# Patient Record
Sex: Female | Born: 1965
Health system: Southern US, Community
[De-identification: ages and names within clinical notes are randomized; demographics above are authoritative.]

## PROBLEM LIST (undated history)

## (undated) DIAGNOSIS — Z9889 Other specified postprocedural states: Secondary | ICD-10-CM

## (undated) DIAGNOSIS — T8859XA Other complications of anesthesia, initial encounter: Secondary | ICD-10-CM

## (undated) DIAGNOSIS — M1990 Unspecified osteoarthritis, unspecified site: Secondary | ICD-10-CM

## (undated) DIAGNOSIS — D649 Anemia, unspecified: Secondary | ICD-10-CM

## (undated) DIAGNOSIS — Z6841 Body Mass Index (BMI) 40.0 and over, adult: Secondary | ICD-10-CM

## (undated) DIAGNOSIS — B019 Varicella without complication: Secondary | ICD-10-CM

## (undated) DIAGNOSIS — K219 Gastro-esophageal reflux disease without esophagitis: Secondary | ICD-10-CM

## (undated) HISTORY — DX: Body Mass Index (BMI) 40.0 and over, adult: Z684

## (undated) HISTORY — DX: Gastro-esophageal reflux disease without esophagitis: K21.9

## (undated) HISTORY — PX: SALPINGECTOMY: SHX328

## (undated) HISTORY — DX: Unspecified osteoarthritis, unspecified site: M19.90

## (undated) HISTORY — DX: Varicella without complication: B01.9

---

## 2004-01-23 ENCOUNTER — Other Ambulatory Visit: Admission: RE | Admit: 2004-01-23 | Discharge: 2004-01-23 | Payer: Self-pay | Admitting: *Deleted

## 2004-09-12 ENCOUNTER — Emergency Department: Payer: Self-pay | Admitting: Unknown Physician Specialty

## 2005-07-23 ENCOUNTER — Emergency Department: Payer: Self-pay | Admitting: Unknown Physician Specialty

## 2006-02-17 ENCOUNTER — Ambulatory Visit: Payer: Self-pay | Admitting: Unknown Physician Specialty

## 2006-05-05 ENCOUNTER — Emergency Department: Payer: Self-pay | Admitting: Emergency Medicine

## 2006-11-24 ENCOUNTER — Emergency Department: Payer: Self-pay | Admitting: Emergency Medicine

## 2007-04-11 ENCOUNTER — Emergency Department: Payer: Self-pay | Admitting: Unknown Physician Specialty

## 2009-01-25 ENCOUNTER — Emergency Department: Payer: Self-pay | Admitting: Emergency Medicine

## 2009-03-14 ENCOUNTER — Emergency Department: Payer: Self-pay | Admitting: Unknown Physician Specialty

## 2009-03-27 ENCOUNTER — Ambulatory Visit: Payer: Self-pay | Admitting: Obstetrics and Gynecology

## 2010-02-11 ENCOUNTER — Emergency Department: Payer: Self-pay | Admitting: Emergency Medicine

## 2010-05-29 ENCOUNTER — Emergency Department: Payer: Self-pay | Admitting: Emergency Medicine

## 2013-08-10 ENCOUNTER — Emergency Department: Payer: Self-pay | Admitting: Emergency Medicine

## 2013-12-01 ENCOUNTER — Ambulatory Visit: Payer: Self-pay | Admitting: Adult Health

## 2014-06-01 ENCOUNTER — Ambulatory Visit: Payer: Self-pay | Admitting: Physician Assistant

## 2014-06-01 LAB — GC/CHLAMYDIA PROBE AMP

## 2014-06-01 LAB — WET PREP, GENITAL

## 2015-04-17 ENCOUNTER — Encounter (HOSPITAL_COMMUNITY): Payer: Self-pay | Admitting: *Deleted

## 2015-04-17 ENCOUNTER — Emergency Department (HOSPITAL_COMMUNITY): Payer: 59

## 2015-04-17 ENCOUNTER — Emergency Department (HOSPITAL_COMMUNITY)
Admission: EM | Admit: 2015-04-17 | Discharge: 2015-04-17 | Disposition: A | Discharge status: Home / Self Care | Payer: 59 | Attending: Emergency Medicine | Admitting: Emergency Medicine

## 2015-04-17 DIAGNOSIS — M545 Low back pain: Secondary | ICD-10-CM | POA: Diagnosis present

## 2015-04-17 DIAGNOSIS — Z3202 Encounter for pregnancy test, result negative: Secondary | ICD-10-CM | POA: Diagnosis not present

## 2015-04-17 DIAGNOSIS — A5903 Trichomonal cystitis and urethritis: Secondary | ICD-10-CM | POA: Diagnosis not present

## 2015-04-17 DIAGNOSIS — Z791 Long term (current) use of non-steroidal anti-inflammatories (NSAID): Secondary | ICD-10-CM | POA: Diagnosis not present

## 2015-04-17 DIAGNOSIS — R6883 Chills (without fever): Secondary | ICD-10-CM | POA: Insufficient documentation

## 2015-04-17 DIAGNOSIS — R102 Pelvic and perineal pain: Secondary | ICD-10-CM

## 2015-04-17 LAB — CBC WITH DIFFERENTIAL/PLATELET
Basophils Absolute: 0 10*3/uL (ref 0.0–0.1)
Basophils Relative: 0 % (ref 0–1)
Eosinophils Absolute: 0.1 10*3/uL (ref 0.0–0.7)
Eosinophils Relative: 3 % (ref 0–5)
HEMATOCRIT: 41 % (ref 36.0–46.0)
Hemoglobin: 13.5 g/dL (ref 12.0–15.0)
Lymphocytes Relative: 48 % — ABNORMAL HIGH (ref 12–46)
Lymphs Abs: 2.2 10*3/uL (ref 0.7–4.0)
MCH: 23 pg — ABNORMAL LOW (ref 26.0–34.0)
MCHC: 32.9 g/dL (ref 30.0–36.0)
MCV: 70 fL — AB (ref 78.0–100.0)
MONOS PCT: 7 % (ref 3–12)
Monocytes Absolute: 0.3 10*3/uL (ref 0.1–1.0)
Neutro Abs: 1.8 10*3/uL (ref 1.7–7.7)
Neutrophils Relative %: 42 % — ABNORMAL LOW (ref 43–77)
Platelets: 144 10*3/uL — ABNORMAL LOW (ref 150–400)
RBC: 5.86 MIL/uL — AB (ref 3.87–5.11)
RDW: 14.2 % (ref 11.5–15.5)
WBC: 4.4 10*3/uL (ref 4.0–10.5)

## 2015-04-17 LAB — URINALYSIS, ROUTINE W REFLEX MICROSCOPIC
BILIRUBIN URINE: NEGATIVE
GLUCOSE, UA: NEGATIVE mg/dL
Ketones, ur: NEGATIVE mg/dL
Nitrite: NEGATIVE
PH: 6.5 (ref 5.0–8.0)
Protein, ur: NEGATIVE mg/dL
Specific Gravity, Urine: 1.019 (ref 1.005–1.030)
Urobilinogen, UA: 0.2 mg/dL (ref 0.0–1.0)

## 2015-04-17 LAB — COMPREHENSIVE METABOLIC PANEL
ALBUMIN: 3.1 g/dL — AB (ref 3.5–5.0)
ALT: 16 U/L (ref 14–54)
AST: 18 U/L (ref 15–41)
Alkaline Phosphatase: 68 U/L (ref 38–126)
Anion gap: 6 (ref 5–15)
BILIRUBIN TOTAL: 0.8 mg/dL (ref 0.3–1.2)
BUN: 14 mg/dL (ref 6–20)
CO2: 29 mmol/L (ref 22–32)
Calcium: 8.7 mg/dL — ABNORMAL LOW (ref 8.9–10.3)
Chloride: 101 mmol/L (ref 101–111)
Creatinine, Ser: 0.96 mg/dL (ref 0.44–1.00)
GLUCOSE: 91 mg/dL (ref 65–99)
Potassium: 4.1 mmol/L (ref 3.5–5.1)
SODIUM: 136 mmol/L (ref 135–145)
Total Protein: 5.9 g/dL — ABNORMAL LOW (ref 6.5–8.1)

## 2015-04-17 LAB — WET PREP, GENITAL
CLUE CELLS WET PREP: NONE SEEN
YEAST WET PREP: NONE SEEN

## 2015-04-17 LAB — URINE MICROSCOPIC-ADD ON

## 2015-04-17 LAB — LIPASE, BLOOD: LIPASE: 18 U/L — AB (ref 22–51)

## 2015-04-17 LAB — POC URINE PREG, ED: PREG TEST UR: NEGATIVE

## 2015-04-17 MED ORDER — METRONIDAZOLE 500 MG PO TABS: 500.0000 mg | ORAL_TABLET | Freq: Two times a day (BID) | ORAL | Status: DC

## 2015-04-17 MED ORDER — LIDOCAINE HCL (PF) 1 % IJ SOLN
0.9000 mL | Freq: Once | INTRAMUSCULAR | Status: AC
  Administered 2015-04-17: 0.9 mL
  Filled 2015-04-17: qty 5

## 2015-04-17 MED ORDER — CEFTRIAXONE SODIUM 250 MG IJ SOLR
250.0000 mg | Freq: Once | INTRAMUSCULAR | Status: AC
  Administered 2015-04-17: 250 mg via INTRAMUSCULAR
  Filled 2015-04-17: qty 250

## 2015-04-17 MED ORDER — AZITHROMYCIN 250 MG PO TABS
1000.0000 mg | ORAL_TABLET | Freq: Once | ORAL | Status: AC
  Administered 2015-04-17: 1000 mg via ORAL
  Filled 2015-04-17: qty 4

## 2015-04-17 NOTE — ED Notes (Signed)
Pt reports right side flank pain x 2-3 days with urinary frequency and vaginal discharge.

## 2015-04-17 NOTE — ED Provider Notes (Signed)
CSN: [id number]     Arrival date & time 04/17/15  0907 History   First MD Initiated Contact with Patient 04/17/15 0913     Chief Complaint  Patient presents with  . Back Pain  . Urinary Frequency     (Consider location/radiation/quality/duration/timing/severity/associated sxs/prior Treatment) The history is provided by the patient. No language interpreter was used.  JOLONDA GOMM is a 49 y/o F with no know significant PMHx presenting to the ED with increased urination and lower abdominal pain that has been ongoing for approximately one week - patient reported that frequent urination and urge came about one week ago followed by the abdominal pain approximately 4-5 days ago. Patient reported that she has been going to the bathroom at least 6-7 times per day, but more at nighttime. Stated that she has been having right sided abdominal pain described as a sharp, shooting pain with radiation to her lower back. Reported that she has been experiencing sharp back pain as well. Stated that she has been taking Tylenol with minimal relief and reported that she has been staying hydrated. Patient reported that her LMP was 2 weeks ago, denied being on birth control. Reported that she does not have history of DM, but paternal grandmother has history. Patient reported that she has also been experiencing vaginal discharge for approximately one week described as yellowish in color and with a fishy odor. Patient reported that she is sexually active and does not use protection, reported that her last encounter was one week ago. Denied dysuria, fever, nausea, vomiting, diarrhea, melena, hematochezia, appetite changes, fainting, dizziness, travels, chest pain, shortness of breath, difficulty breathing, vaginal itching, vaginal irritation, rashes, sores, abnormal vaginal bleeding, urinary bowel incontinence. PCP none  History reviewed. No pertinent past medical history. History reviewed. No pertinent past surgical  history. No family history on file. History  Substance Use Topics  . Smoking status: Never Smoker   . Smokeless tobacco: Not on file  . Alcohol Use: No   OB History    No data available     Review of Systems  Constitutional: Positive for chills. Negative for fever.  Respiratory: Negative for chest tightness and shortness of breath.   Cardiovascular: Negative for chest pain.  Gastrointestinal: Positive for abdominal pain. Negative for nausea, vomiting, diarrhea, constipation, blood in stool and anal bleeding.  Endocrine: Positive for polydipsia and polyuria.  Genitourinary: Positive for vaginal discharge. Negative for dysuria, flank pain, decreased urine volume, vaginal bleeding, vaginal pain and pelvic pain.  Musculoskeletal: Positive for back pain.  Neurological: Negative for weakness.      Allergies  Review of patient's allergies indicates no known allergies.  Home Medications   Prior to Admission medications   Medication Sig Start Date End Date Taking? Authorizing Provider  Acetaminophen 500 MG coapsule Take 500 mg by mouth every 4 (four) hours as needed for pain.    Yes Historical Provider, MD  celecoxib (CELEBREX) 200 MG capsule Take 200 mg by mouth 2 (two) times daily.  01/24/15  Yes Historical Provider, MD  meloxicam (MOBIC) 15 MG tablet Take 15 mg by mouth daily.   Yes Historical Provider, MD  metroNIDAZOLE (FLAGYL) 500 MG tablet Take 1 tablet (500 mg total) by mouth 2 (two) times daily. 04/17/15   Majorie Santee, PA-C   BP 126/78 mmHg  Pulse 69  Temp(Src) 97.8 F (36.6 C) (Oral)  Resp 16  SpO2 100% Physical Exam  Constitutional: She is oriented to person, place, and time. She appears well-developed and  well-nourished. No distress.  HENT:  Head: Normocephalic and atraumatic.  Mouth/Throat: Oropharynx is clear and moist. No oropharyngeal exudate.  Eyes: Conjunctivae and EOM are normal. Right eye exhibits no discharge. Left eye exhibits no discharge.  Neck: Normal  range of motion. Neck supple.  Cardiovascular: Normal rate, regular rhythm and normal heart sounds.  Exam reveals no friction rub.   No murmur heard. Pulses:      Radial pulses are 2+ on the right side, and 2+ on the left side.  Pulmonary/Chest: Effort normal and breath sounds normal. No respiratory distress. She has no wheezes. She has no rales.  Abdominal: Soft. Bowel sounds are normal. She exhibits no distension. There is tenderness in the right lower quadrant, epigastric area, suprapubic area and left lower quadrant. There is no rebound, no guarding and no CVA tenderness.  Mild epigastric pain - most discomfort upon palpation to the lower abdomen.   Obese   Genitourinary:  Pelvic Exam: Negative swelling, erythema, inflammation, lesions, sores, deformities, areas of induration or fluctuance noted on the external genitalia. Negative bright red blood in the vaginal vault. Thick yellow discharge identified with foul odor. Negative cervical friability. Negative CMT. Mild bilateral adnexal tenderness. Exam chaperoned with nurse, Hayley.   Musculoskeletal: Normal range of motion. She exhibits tenderness.       Lumbar back: She exhibits tenderness. She exhibits normal range of motion, no bony tenderness, no swelling, no edema and no deformity.       Back:  Negative deformities identified to the spine. Mild discomfort upon palpation to the lumbar aspect midline and paravertebral regions bilaterally-appears to be muscular in nature.  Neurological: She is alert and oriented to person, place, and time. No cranial nerve deficit. She exhibits normal muscle tone. Coordination normal. GCS eye subscore is 4. GCS verbal subscore is 5. GCS motor subscore is 6.  Skin: Skin is warm and dry. No rash noted. She is not diaphoretic. No erythema.  Psychiatric: She has a normal mood and affect. Her behavior is normal. Thought content normal.  Nursing note and vitals reviewed.   ED Course  Procedures (including  critical care time)  Results for orders placed or performed during the hospital encounter of 04/17/15  Wet prep, genital  Result Value Ref Range   Yeast Wet Prep HPF POC NONE SEEN NONE SEEN   Trich, Wet Prep FEW (A) NONE SEEN   Clue Cells Wet Prep HPF POC NONE SEEN NONE SEEN   WBC, Wet Prep HPF POC FEW (A) NONE SEEN  CBC with Differential/Platelet  Result Value Ref Range   WBC 4.4 4.0 - 10.5 K/uL   RBC 5.86 (H) 3.87 - 5.11 MIL/uL   Hemoglobin 13.5 12.0 - 15.0 g/dL   HCT 41.0 36.0 - 46.0 %   MCV 70.0 (L) 78.0 - 100.0 fL   MCH 23.0 (L) 26.0 - 34.0 pg   MCHC 32.9 30.0 - 36.0 g/dL   RDW 14.2 11.5 - 15.5 %   Platelets 144 (L) 150 - 400 K/uL   Neutrophils Relative % 42 (L) 43 - 77 %   Lymphocytes Relative 48 (H) 12 - 46 %   Monocytes Relative 7 3 - 12 %   Eosinophils Relative 3 0 - 5 %   Basophils Relative 0 0 - 1 %   Neutro Abs 1.8 1.7 - 7.7 K/uL   Lymphs Abs 2.2 0.7 - 4.0 K/uL   Monocytes Absolute 0.3 0.1 - 1.0 K/uL   Eosinophils Absolute 0.1 0.0 - 0.7  K/uL   Basophils Absolute 0.0 0.0 - 0.1 K/uL   RBC Morphology POLYCHROMASIA PRESENT   Comprehensive metabolic panel  Result Value Ref Range   Sodium 136 135 - 145 mmol/L   Potassium 4.1 3.5 - 5.1 mmol/L   Chloride 101 101 - 111 mmol/L   CO2 29 22 - 32 mmol/L   Glucose, Bld 91 65 - 99 mg/dL   BUN 14 6 - 20 mg/dL   Creatinine, Ser 0.96 0.44 - 1.00 mg/dL   Calcium 8.7 (L) 8.9 - 10.3 mg/dL   Total Protein 5.9 (L) 6.5 - 8.1 g/dL   Albumin 3.1 (L) 3.5 - 5.0 g/dL   AST 18 15 - 41 U/L   ALT 16 14 - 54 U/L   Alkaline Phosphatase 68 38 - 126 U/L   Total Bilirubin 0.8 0.3 - 1.2 mg/dL   GFR calc non Af Amer >60 >60 mL/min   GFR calc Af Amer >60 >60 mL/min   Anion gap 6 5 - 15  Urinalysis, Routine w reflex microscopic (not at [facility])  Result Value Ref Range   Color, Urine YELLOW YELLOW   APPearance HAZY (A) CLEAR   Specific Gravity, Urine 1.019 1.005 - 1.030   pH 6.5 5.0 - 8.0   Glucose, UA NEGATIVE NEGATIVE mg/dL   Hgb urine  dipstick TRACE (A) NEGATIVE   Bilirubin Urine NEGATIVE NEGATIVE   Ketones, ur NEGATIVE NEGATIVE mg/dL   Protein, ur NEGATIVE NEGATIVE mg/dL   Urobilinogen, UA 0.2 0.0 - 1.0 mg/dL   Nitrite NEGATIVE NEGATIVE   Leukocytes, UA TRACE (A) NEGATIVE  Lipase, blood  Result Value Ref Range   Lipase 18 (L) 22 - 51 U/L  Urine microscopic-add on  Result Value Ref Range   Squamous Epithelial / LPF MANY (A) RARE   WBC, UA 7-10 <3 WBC/hpf   RBC / HPF 3-6 <3 RBC/hpf   Bacteria, UA MANY (A) RARE   Urine-Other TRICHOMONAS PRESENT   POC urine preg, ED (not at [facility])  Result Value Ref Range   Preg Test, Ur NEGATIVE NEGATIVE    Labs Review Labs Reviewed  WET PREP, GENITAL - Abnormal; Notable for the following:    Trich, Wet Prep FEW (*)    WBC, Wet Prep HPF POC FEW (*)    All other components within normal limits  CBC WITH DIFFERENTIAL/PLATELET - Abnormal; Notable for the following:    RBC 5.86 (*)    MCV 70.0 (*)    MCH 23.0 (*)    Platelets 144 (*)    Neutrophils Relative % 42 (*)    Lymphocytes Relative 48 (*)    All other components within normal limits  COMPREHENSIVE METABOLIC PANEL - Abnormal; Notable for the following:    Calcium 8.7 (*)    Total Protein 5.9 (*)    Albumin 3.1 (*)    All other components within normal limits  URINALYSIS, ROUTINE W REFLEX MICROSCOPIC (NOT AT [facility]) - Abnormal; Notable for the following:    APPearance HAZY (*)    Hgb urine dipstick TRACE (*)    Leukocytes, UA TRACE (*)    All other components within normal limits  LIPASE, BLOOD - Abnormal; Notable for the following:    Lipase 18 (*)    All other components within normal limits  URINE MICROSCOPIC-ADD ON - Abnormal; Notable for the following:    Squamous Epithelial / LPF MANY (*)    Bacteria, UA MANY (*)    All other components within normal limits  POC URINE PREG, ED  GC/CHLAMYDIA PROBE AMP () NOT AT [facility]    Imaging Review US Transvaginal Non-ob  04/17/2015   CLINICAL DATA:  Right  flank pain, urinary frequency for 2-3 days.  EXAM: TRANSABDOMINAL AND TRANSVAGINAL ULTRASOUND OF PELVIS  DOPPLER ULTRASOUND OF OVARIES  TECHNIQUE: Both transabdominal and transvaginal ultrasound examinations of the pelvis were performed. Transabdominal technique was performed for global imaging of the pelvis including uterus, ovaries, adnexal regions, and pelvic cul-de-sac.  It was necessary to proceed with endovaginal exam following the transabdominal exam to visualize the endometrium and ovaries. Color and duplex Doppler ultrasound was utilized to evaluate blood flow to the ovaries.  COMPARISON:  None.  FINDINGS: Uterus  Measurements: 8.9 x 4.7 x 5.4 cm. Heterogeneous echotexture without focal abnormality. Fluid noted within the endocervical canal.  Endometrium  Thickness: Difficult to visualize due to the heterogeneous echotexture, 5 mm where visualized. No focal abnormality visualized.  Right ovary  Measurements: 4.0 x 2.6 x 3.2 cm. Normal appearance/no adnexal mass.  Left ovary  Measurements: 2.6 x 2.9 x 2.6 cm. Normal appearance/no adnexal mass.  Pulsed Doppler evaluation of both ovaries demonstrates normal low-resistance arterial and venous waveforms.  Other findings  No free fluid.  IMPRESSION: Heterogeneous echotexture within the uterus without focal abnormality. This can be seen with adenomyosis.  No acute findings.   Electronically Signed   By: Rolm Baptise M.D.   On: 04/17/2015 13:39   US Pelvis Complete  04/17/2015   CLINICAL DATA:  Right flank pain, urinary frequency for 2-3 days.  EXAM: TRANSABDOMINAL AND TRANSVAGINAL ULTRASOUND OF PELVIS  DOPPLER ULTRASOUND OF OVARIES  TECHNIQUE: Both transabdominal and transvaginal ultrasound examinations of the pelvis were performed. Transabdominal technique was performed for global imaging of the pelvis including uterus, ovaries, adnexal regions, and pelvic cul-de-sac.  It was necessary to proceed with endovaginal exam following the transabdominal exam to  visualize the endometrium and ovaries. Color and duplex Doppler ultrasound was utilized to evaluate blood flow to the ovaries.  COMPARISON:  None.  FINDINGS: Uterus  Measurements: 8.9 x 4.7 x 5.4 cm. Heterogeneous echotexture without focal abnormality. Fluid noted within the endocervical canal.  Endometrium  Thickness: Difficult to visualize due to the heterogeneous echotexture, 5 mm where visualized. No focal abnormality visualized.  Right ovary  Measurements: 4.0 x 2.6 x 3.2 cm. Normal appearance/no adnexal mass.  Left ovary  Measurements: 2.6 x 2.9 x 2.6 cm. Normal appearance/no adnexal mass.  Pulsed Doppler evaluation of both ovaries demonstrates normal low-resistance arterial and venous waveforms.  Other findings  No free fluid.  IMPRESSION: Heterogeneous echotexture within the uterus without focal abnormality. This can be seen with adenomyosis.  No acute findings.   Electronically Signed   By: Rolm Baptise M.D.   On: 04/17/2015 13:39   [place] Art/ven Flow Abd Pelv Doppler  04/17/2015   CLINICAL DATA:  Right flank pain, urinary frequency for 2-3 days.  EXAM: TRANSABDOMINAL AND TRANSVAGINAL ULTRASOUND OF PELVIS  DOPPLER ULTRASOUND OF OVARIES  TECHNIQUE: Both transabdominal and transvaginal ultrasound examinations of the pelvis were performed. Transabdominal technique was performed for global imaging of the pelvis including uterus, ovaries, adnexal regions, and pelvic cul-de-sac.  It was necessary to proceed with endovaginal exam following the transabdominal exam to visualize the endometrium and ovaries. Color and duplex Doppler ultrasound was utilized to evaluate blood flow to the ovaries.  COMPARISON:  None.  FINDINGS: Uterus  Measurements: 8.9 x 4.7 x 5.4 cm. Heterogeneous echotexture without focal abnormality. Fluid noted  within the endocervical canal.  Endometrium  Thickness: Difficult to visualize due to the heterogeneous echotexture, 5 mm where visualized. No focal abnormality visualized.  Right ovary   Measurements: 4.0 x 2.6 x 3.2 cm. Normal appearance/no adnexal mass.  Left ovary  Measurements: 2.6 x 2.9 x 2.6 cm. Normal appearance/no adnexal mass.  Pulsed Doppler evaluation of both ovaries demonstrates normal low-resistance arterial and venous waveforms.  Other findings  No free fluid.  IMPRESSION: Heterogeneous echotexture within the uterus without focal abnormality. This can be seen with adenomyosis.  No acute findings.   Electronically Signed   By: Rolm Baptise M.D.   On: 04/17/2015 13:39     EKG Interpretation None      MDM   Final diagnoses:  Pelvic pain in female  Trichomonal cystitis    Medications  cefTRIAXone (ROCEPHIN) injection 250 mg (250 mg Intramuscular Given 04/17/15 1219)  azithromycin (ZITHROMAX) tablet 1,000 mg (1,000 mg Oral Given 04/17/15 1218)  lidocaine (PF) (XYLOCAINE) 1 % injection 0.9 mL (0.9 mLs Other Given 04/17/15 1219)    Filed Vitals:   04/17/15 1245 04/17/15 1337 04/17/15 1352 04/17/15 1400  BP:  129/77  126/78  Pulse: 64  74 69  Temp:      TempSrc:      Resp:      SpO2: 100%  100% 100%   CBC negative elevated leukocytosis. Hemoglobin 13.5, hematocrit 41.0. CMP noted glucose of 91 with negative elevated anion gap. Lipase negative elevation. Urine pregnancy negative. UA noted trace of hemoglobin with a red blood cell count of 3-6, with negative nitrites, trace leukocytes with many squamous cells and white blood cell count 7-10, trichomonas noted in urine. Wet prep notedEast, few Trichomonas, no clue cells and few white blood cells. GC Chlamydia probe pending. Ultrasound pelvis noted heterogeneous echotexture within the uterus without focal abnormality. No acute abnormalities noted. Abdominal examination benign-doubt acute abdomen. Negative CVA tenderness, trace hemoglobin noted in urine with a red blood cell count of 3-6, not many - doubt pyelonephritis or kidney stone. Negative findings of tubo-ovarian abscess. Negative findings of ovarian torsion. Upon  pelvic exam patient did have mild discomfort-ultrasound is ordered and pending. Trichomonas noted in urine, will treat patient-reported that she is sexually active and has not use protection - reported last encounter was one week ago. Glucose is 91 with negative elevated anion gap-no findings of ketones or glucose in urine-doubt new onset diabetes. Patient treated with by mouth medications in ED setting. Negative episodes of emesis while in ED setting. Patient stable, afebrile. Patient not septic appearing. Negative signs of respiratory distress. Discharged patient. Discharge patient with Flagyl. Discussed with patient to follow-up with health department and for previous partner/partners within the past 3-6 months to be tested and treated as well. Discussed with patient to avoid any sexual activity, educated patient on safe sex habits. Discussed with patient to rest and stay hydrated. Discussed with patient to avoid any physical or strenuous activity. Discussed with patient to closely monitor symptoms and if symptoms are to worsen or change to report back to the ED - strict return instructions given.  Patient agreed to plan of care, understood, all questions answered.   Jamse Mead, PA-C 04/17/15 1443  Daleen Bo, MD 04/22/15 1950

## 2015-04-17 NOTE — Discharge Instructions (Signed)
Please call your doctor for a followup appointment within 24-48 hours. When you talk to your doctor please let them know that you were seen in the emergency department and have them acquire all of your records so that they can discuss the findings with you and formulate a treatment plan to fully care for your new and ongoing problems. Please take medications as prescribed Please follow-up with her primary care provider Please follow-up with OB/GYN and health and wellness Center Please have all partner/partners within the past 3 months be tested and treated, this can be performed at health Department Please avoid any sexual activity until fully treated and rechecked Safe sex is always important-please always use a condom While taking Flagyl there is to be no drinking alcohol for this can result in worsening symptoms Please continue to monitor symptoms closely and if symptoms are to worsen or change (fever greater than 101, chills, sweating, nausea, vomiting, chest pain, shortness of breathe, difficulty breathing, weakness, numbness, tingling, worsening or changes to pain pattern, blood in the stool, black tarry stools, change to vaginal discharge, inability keep food fluids down) please report back to the Emergency Department immediately.   Trichomoniasis Trichomoniasis is an infection caused by an organism called Trichomonas. The infection can affect both women and men. In women, the outer female genitalia and the vagina are affected. In men, the penis is mainly affected, but the prostate and other reproductive organs can also be involved. Trichomoniasis is a sexually transmitted infection (STI) and is most often passed to another person through sexual contact.  RISK FACTORS  Having unprotected sexual intercourse.  Having sexual intercourse with an infected partner. SIGNS AND SYMPTOMS  Symptoms of trichomoniasis in women include:  Abnormal gray-green frothy vaginal discharge.  Itching and  irritation of the vagina.  Itching and irritation of the area outside the vagina. Symptoms of trichomoniasis in men include:   Penile discharge with or without pain.  Pain during urination. This results from inflammation of the urethra. DIAGNOSIS  Trichomoniasis may be found during a Pap test or physical exam. Your health care provider may use one of the following methods to help diagnose this infection:  Examining vaginal discharge under a microscope. For men, urethral discharge would be examined.  Testing the pH of the vagina with a test tape.  Using a vaginal swab test that checks for the Trichomonas organism. A test is available that provides results within a few minutes.  Doing a culture test for the organism. This is not usually needed. TREATMENT   You may be given medicine to fight the infection. Women should inform their health care provider if they could be or are pregnant. Some medicines used to treat the infection should not be taken during pregnancy.  Your health care provider may recommend over-the-counter medicines or creams to decrease itching or irritation.  Your sexual partner will need to be treated if infected. HOME CARE INSTRUCTIONS   Take medicines only as directed by your health care provider.  Take over-the-counter medicine for itching or irritation as directed by your health care provider.  Do not have sexual intercourse while you have the infection.  Women should not douche or wear tampons while they have the infection.  Discuss your infection with your partner. Your partner may have gotten the infection from you, or you may have gotten it from your partner.  Have your sex partner get examined and treated if necessary.  Practice safe, informed, and protected sex.  See your health care  provider for other STI testing. SEEK MEDICAL CARE IF:   You still have symptoms after you finish your medicine.  You develop abdominal pain.  You have pain when  you urinate.  You have bleeding after sexual intercourse.  You develop a rash.  Your medicine makes you sick or makes you throw up (vomit). MAKE SURE YOU:  Understand these instructions.  Will watch your condition.  Will get help right away if you are not doing well or get worse. Document Released: 04/21/2001 Document Revised: 03/12/2014 Document Reviewed: 08/07/2013 [facility] Patient Information 2015 [place], [place]. This information is not intended to replace advice given to you by your health care provider. Make sure you discuss any questions you have with your health care provider.   Emergency Department Resource Guide 1) Find a Doctor and Pay Out of Pocket Although you won't have to find out who is covered by your insurance plan, it is a good idea to ask around and get recommendations. You will then need to call the office and see if the doctor you have chosen will accept you as a new patient and what types of options they offer for patients who are self-pay. Some doctors offer discounts or will set up payment plans for their patients who do not have insurance, but you will need to ask so you aren't surprised when you get to your appointment.  2) Contact Your Local Health Department Not all health departments have doctors that can see patients for sick visits, but many do, so it is worth a call to see if yours does. If you don't know where your local health department is, you can check in your phone book. The CDC also has a tool to help you locate your state's health department, and many state websites also have listings of all of their local health departments.  3) Find a [facility] If your illness is not likely to be very severe or complicated, you may want to try a walk in clinic. These are popping up all over the country in pharmacies, drugstores, and shopping centers. They're usually staffed by nurse practitioners or physician assistants that have been trained to treat common  illnesses and complaints. They're usually fairly quick and inexpensive. However, if you have serious medical issues or chronic medical problems, these are probably not your best option.  No Primary Care Doctor: - Call Health Connect at  [phone] - they can help you locate a primary care doctor that  accepts your insurance, provides certain services, etc. - Physician Referral Service- [phone]  Chronic Pain Problems: Organization         Address  Phone   Notes  [facility]  [phone] Patients need to be referred by their primary care doctor.   Medication Assistance: Organization         Address  Phone   Notes  [facility] Medication [facility] [address] [place], [place] [address] [phone] --Must be a resident of [facility] -- Must have NO insurance coverage whatsoever (no Medicaid/ Medicare, etc.) -- The pt. MUST have a primary care doctor that directs their care regularly and follows them in the community   Med[facility]ist  [phone]   [employer]  [phone]    Agencies that provide inexpensive medical care: Organization         Address  Phone   Notes  [place]  [phone]   [facility]    (  [phone]   [facility] [place], [place] [address] [phone]   [place] [address], [place] [phone]   Planned Parenthood    [phone]   [facility]    [phone]   [address], [place] Phone:  [phone], Fax:  [phone] Hours of Operation:  9 am - 6 pm, M-F.  Also accepts Medicaid/Medicare and self-pay.  [facility] for [address], [address], [place] Phone: [phone], Fax: [phone]. Hours of Operation:  8:30 am - 5:30 pm, M-F.  Also accepts Medicaid and self-pay.  [facility]  [address] [place] Phone: [phone]   [place], [place], [place] [phone], Ext. [phone] Mondays & Thursdays: 7-9 AM.  First 15 patients are seen on a first come, first serve basis.    [place] Providers:  Organization         Address  Phone   Notes  [facility] [address], [place] [phone] Also accepts self-pay patients.  [facility] [address], [place]  [phone]   [place], [address], [place] [phone]   [facility] [address], [place] [phone]   Lucianne Lei [address], [place]   [phone] Only accepts [place] Access [place] patients after they have their name applied to their card.   Self-Pay (no insurance) in [facility]:  Organization         Address  Phone   Notes  Sickle Cell Patients, [facility] [place] [phone]   [facility]gent Care [place] [phone]   [facility] [place]  [place], [place], [place] [phone]   [facility]/Dr. Osei-Bonsu  [address] [place] or [address], [place] [phone] Phone number for both [place] and [place] locations is the same.  Urgent Medical and [facility] [address], [place] [phone]   [facility] [address], [place] or [address] [phone]   [facility] [address] [place] [phone], phone; [phone], fax Sees patients 1st and 3rd Saturday of every month.  Must not qualify for public or private insurance (i.e. Medicaid, Medicare, [place] Health Choice, Veterans' Benefits)  Household income should be no more than 200% of the  poverty level The clinic cannot treat you if you are pregnant or think you are pregnant  Sexually transmitted diseases are not treated at the clinic.    Dental Care: Organization         Address  Phone  Notes  [facility] Department of [facility] [place] [phone] Accepts children up to age 70 who are enrolled in [place] or [place]; pregnant women with a Medicaid card; and children who have applied for Medicaid or [place] Health Choice, but were declined, whose parents can pay a reduced fee at time of service.  [facility] Department of [facility]  [address], [place] [phone] Accepts children up  to age 26 who are enrolled in Medicaid or [place] Health Choice; pregnant women with a Medicaid card; and children who have applied for Medicaid or [place] Health Choice, but were declined, whose parents can pay a reduced fee at time of service.  [facility] Access PRO[facility]  [place] [phone] Patients are seen by appointment only. Walk-ins are not accepted. [place] will see patients 27 years of age and older. Monday - Tuesday (8am-5pm) Most Wednesdays (8:30-5pm) $30 per visit, cash only  [facility] Adult Dental Access PROGRAM  [address], [facility] [phone] Patients are seen by appointment only. Walk-ins are not accepted. [place] will see patients 68 years of age and older. One Wednesday Evening (Monthly: Volunteer Based).  $30 per visit, cash only  [place]  [phone] for adults; Children under age 37, call Graduate Pediatric Dentistry at [phone]. Children aged 65-14, please call [phone] to request a pediatric application.  Dental services are provided in all areas of dental care including fillings, crowns and bridges, complete and partial dentures, implants, gum treatment, root canals, and extractions.  Preventive care is also provided. Treatment is provided to both adults and children. Patients are selected via a lottery and there is often a waiting list.   [facility] [address] [place]  [phone] [email or web address]   [facility] [address] [place], [place] [phone], Ext. [phone] Second and Fourth Thursday of each month, opens at 6:30 AM; Clinic ends at 9 AM.  Patients are seen on a first-come first-served basis, and a limited number are seen during each clinic.   [facility]  [address] [place]ard Danker [place], [place] [phone]   Eligibility Requirements You must have lived in [place], [place], or [place] counties for at least the last three months.   You cannot be eligible for state or federal sponsored [employer], including [employer], [place], or [employer].   You generally cannot be eligible for healthcare insurance through your employer.    How to apply: Eligibility screenings are held every Tuesday and Wednesday afternoon from 1:00 pm until 4:00 pm. You do not need an appointment for the interview!  [facility] [address], [place], [place]   [place]  [place] Department  [place]  [phone]    Behavioral Health Resources in the Community: Intensive Outpatient Programs Organization         Address  Phone  Notes  Irena Quail. [address], [place], [place] [phone]   [facility] Outpatient [address] [place], [place]   ADS: Alcohol & Drug Svcs [address] [place], [place]   [place] [address]  [place], [place] or [phone]   Substance Abuse Resources Organization         Address  Phone  Notes  Alcohol and Drug Services  [phone]   [place]  [phone]   [facility] [place]   Chinita Pester  [phone]   Residential & Outpatient Substance Abuse Program  [phone]   Psychological Services Organization         Address  Phone  Notes  [place]  [place]  [place]   [place]  [address] [place] or [phone]    Mobile Crisis Teams Organization         Address  Phone  Notes  [facility], Mobile Crisis Care Unit  [phone]   [facility]  [address] [place], [place]   [place] [address], [place] [phone]    Self-Help/Support Groups Organization         Address  Phone             Notes  [place]. of [place] - variety of support groups  [place] Call for more information  Narcotics Anonymous (NA), [facility]aring [facility] [address], [employer] [place]  2 meetings at this location   Special educational needs teacher         Address  Phone  Notes  [facility] Residential Treatment [place],    [place]  [phone]   [facility]  [address] [place] [address]902, [place], [place]   [place], [place] [phone] Admissions: 8am-3pm M-F  Incentives Substance [place] [address]    [place], [place] [phone]   [facility] [address] [place], [place], [place]   [facility] [address]  [place], [place]   [facility] - Intensive Outpatient [address] Kristeen [address], [place], [place]   [facility] ([place].,  [place], [place] [phone] or [phone]   [facility] Services (RTS) [address], [place], [place] Accepts Medicaid  [facility] [address],  [place]  [phone] Substance Abuse/Addiction Treatment   [facility] Organization         Address  Phone  Notes  [facility]  [phone]   Domenic Schwab, PhD [address] Arlis Porta [place], [place]   [phone] or [phone]   [place], [place] [phone]   [facility] [address] [place], [place] [phone] Insurance/Medicaid/sponsorship through [facility] and Families [address] [place]                                    [place], [place] [phone] [place] [address] [place] [address], [place] [phone]    Dr. Adele Schilder  [phone]   [facility] Dept. 1) [address]  2) [place] 3)  [place] [address], [place] [phone]  [phone]   [place] [phone] or [phone] (After Hours)

## 2015-04-17 NOTE — ED Notes (Signed)
PA @ bedside.

## 2015-04-17 NOTE — ED Provider Notes (Signed)
  Face-to-face evaluation   History: She has dysuria, urinary frequency and low back pain. Symptoms short-term.  Physical exam: Alert, obese female, appears comfortable. No respiratory distress. She is lucid, and cooperative.  Medical screening examination/treatment/procedure(s) were conducted as a shared visit with non-physician practitioner(s) and myself.  I personally evaluated the patient during the encounter  Daleen Bo, MD 04/22/15 1950

## 2015-04-17 NOTE — ED Notes (Signed)
PA at bedside.

## 2015-04-17 NOTE — ED Notes (Signed)
Patient transported to Ultrasound 

## 2015-04-18 LAB — GC/CHLAMYDIA PROBE AMP (~~LOC~~) NOT AT ARMC
CHLAMYDIA, DNA PROBE: NEGATIVE
Neisseria Gonorrhea: NEGATIVE

## 2015-04-25 ENCOUNTER — Encounter: Payer: Self-pay | Admitting: Internal Medicine

## 2015-04-25 ENCOUNTER — Ambulatory Visit (INDEPENDENT_AMBULATORY_CARE_PROVIDER_SITE_OTHER): Payer: 59 | Admitting: Internal Medicine

## 2015-04-25 VITALS — BP 114/62 | HR 96 | Temp 98.1°F | Ht 66.0 in | Wt 288.0 lb

## 2015-04-25 DIAGNOSIS — K219 Gastro-esophageal reflux disease without esophagitis: Secondary | ICD-10-CM

## 2015-04-25 DIAGNOSIS — M129 Arthropathy, unspecified: Secondary | ICD-10-CM | POA: Diagnosis not present

## 2015-04-25 DIAGNOSIS — M170 Bilateral primary osteoarthritis of knee: Secondary | ICD-10-CM | POA: Insufficient documentation

## 2015-04-25 MED ORDER — RANITIDINE HCL 150 MG PO CAPS: 150.0000 mg | ORAL_CAPSULE | Freq: Every evening | ORAL | Status: DC

## 2015-04-25 NOTE — Progress Notes (Signed)
HPI  Pt presents to the clinic today to establish care and for management of the conditions listed below. Valerie Andrews has not had a PCP in many years.  Arthritis: Mainly in her knees. Valerie Andrews alternates Tylenol, Meloxicam, and Celebrex which does seem to help. Valerie Andrews knows that her knee pain is exacerbated by her weight.  GERD: Valerie Andrews reports this occurs about every day. Spicy foods and fried foods make it worse. Valerie Andrews tries to avoid her triggers. Valerie Andrews takes Tums and Rolaids just about on a daily basis.  Obesity: Valerie Andrews does not adhere to any exercise or diet regimen. Valerie Andrews eats whatever Valerie Andrews wants to eat. Her BMI is 48.  Flu: 08/2014 Tetanus: Valerie Andrews thinks it was around 2008-2009 LMP: May 30th, regular Pap Smear: 2014, normal Mammogram: 2013 at [place] Screening: as needed Dentist: as needeed  Past Medical History  Diagnosis Date  . Arthritis   . GERD (gastroesophageal reflux disease)   . Chicken pox     Current Outpatient Prescriptions  Medication Sig Dispense Refill  . Acetaminophen 500 MG coapsule Take 500 mg by mouth every 4 (four) hours as needed for pain.     . celecoxib (CELEBREX) 200 MG capsule Take 200 mg by mouth 2 (two) times daily.     . meloxicam (MOBIC) 15 MG tablet Take 15 mg by mouth daily.    . metroNIDAZOLE (FLAGYL) 500 MG tablet Take 1 tablet (500 mg total) by mouth 2 (two) times daily. 14 tablet 0   No current facility-administered medications for this visit.    No Known Allergies  Family History  Problem Relation Age of Onset  . Hypertension Mother     History   Social History  . Marital Status: Single    Spouse Name: N/A  . Number of Children: N/A  . Years of Education: N/A   Occupational History  . Not on file.   Social History Main Topics  . Smoking status: Never Smoker   . Smokeless tobacco: Not on file  . Alcohol Use: No  . Drug Use: No  . Sexual Activity: Not on file   Other Topics Concern  . Not on file   Social History Narrative     ROS:  Constitutional: Denies fever, malaise, fatigue, headache or abrupt weight changes.  HEENT: Denies eye pain, eye redness, ear pain, ringing in the ears, wax buildup, runny nose, nasal congestion, bloody nose, or sore throat. Respiratory: Denies difficulty breathing, shortness of breath, cough or sputum production.   Cardiovascular: Denies chest pain, chest tightness, palpitations or swelling in the hands or feet.  Gastrointestinal: Pt reports reflux. Denies abdominal pain, bloating, constipation, diarrhea or blood in the stool.  GU: Denies frequency, urgency, pain with urination, blood in urine, odor or discharge. Musculoskeletal: Pt reports bilateral knee pain. Denies decrease in range of motion, difficulty with gait, muscle pain or joint swelling.  Skin: Denies redness, rashes, lesions or ulcercations.  Neurological: Denies dizziness, difficulty with memory, difficulty with speech or problems with balance and coordination.  Psych: Denies anxiety, depression, SI/HI.  No other specific complaints in a complete review of systems (except as listed in HPI above).  PE:  BP 114/62 mmHg  Pulse 96  Temp(Src) 98.1 F (36.7 C) (Oral)  Ht 5\' 6"  (1.676 m)  Wt 288 lb (130.636 kg)  BMI 46.51 kg/m2  SpO2 98%  LMP 04/04/2015 Wt Readings from Last 3 Encounters:  04/25/15 288 lb (130.636 kg)    General: Appears her stated age, obese in NAD.  Cardiovascular: Normal rate and rhythm. S1,S2 noted.  No murmur, rubs or gallops noted.  Pulmonary/Chest: Normal effort and positive vesicular breath sounds. No respiratory distress. No wheezes, rales or ronchi noted.  Abdomen: Soft and nontender. Normal bowel sounds, no bruits noted. No distention or masses noted. Musculoskeletal: Normal flexion and extension of the knees. No crepitus noted with ROM. Pain with palpation of the lateral joint lines bilaterally. No signs of joint swelling. No difficulty with gait.  Neurological: Alert and oriented.   Psychiatric: Mood and affect normal. Behavior is normal. Judgment and thought seem somewhat delayed.     BMET    Component Value Date/Time   NA 136 04/17/2015 0937   K 4.1 04/17/2015 0937   CL 101 04/17/2015 0937   CO2 29 04/17/2015 0937   GLUCOSE 91 04/17/2015 0937   BUN 14 04/17/2015 0937   CREATININE 0.96 04/17/2015 0937   CALCIUM 8.7* 04/17/2015 0937   GFRNONAA >60 04/17/2015 0937   GFRAA >60 04/17/2015 0937    Lipid Panel  No results found for: CHOL, TRIG, HDL, CHOLHDL, VLDL, LDLCALC  CBC    Component Value Date/Time   WBC 4.4 04/17/2015 0937   RBC 5.86* 04/17/2015 0937   HGB 13.5 04/17/2015 0937   HCT 41.0 04/17/2015 0937   PLT 144* 04/17/2015 0937   MCV 70.0* 04/17/2015 0937   MCH 23.0* 04/17/2015 0937   MCHC 32.9 04/17/2015 0937   RDW 14.2 04/17/2015 0937   LYMPHSABS 2.2 04/17/2015 0937   MONOABS 0.3 04/17/2015 0937   EOSABS 0.1 04/17/2015 0937   BASOSABS 0.0 04/17/2015 0937    Hgb A1C No results found for: HGBA1C   Assessment and Plan:

## 2015-04-25 NOTE — Assessment & Plan Note (Signed)
Discussed how weight loss could improve her symptoms Will start Zantac 150 mg PO daily, RX given Advised her to avoid her triggers Handout given on diet information for GERD

## 2015-04-25 NOTE — Assessment & Plan Note (Signed)
Encouraged her to try water aerobics which may help her loose some weight but be less harsh or her knees Advised her to consume a 1500 calorie diet, avoid fried and fast foods

## 2015-04-25 NOTE — Assessment & Plan Note (Signed)
Discussed how her weight is impacting her knee pain Advised her not to take Celebrex and Meloxicam together, either one or the other

## 2015-04-25 NOTE — Patient Instructions (Signed)

## 2015-04-25 NOTE — Progress Notes (Signed)
Pre visit review using our clinic review tool, if applicable. No additional management support is needed unless otherwise documented below in the visit note. 

## 2015-06-25 ENCOUNTER — Encounter: Payer: 59 | Admitting: Internal Medicine

## 2015-07-04 ENCOUNTER — Ambulatory Visit (INDEPENDENT_AMBULATORY_CARE_PROVIDER_SITE_OTHER): Payer: 59 | Admitting: Internal Medicine

## 2015-07-04 ENCOUNTER — Encounter: Payer: Self-pay | Admitting: Internal Medicine

## 2015-07-04 VITALS — BP 108/72 | HR 93 | Temp 98.4°F | Ht 66.0 in | Wt 287.0 lb

## 2015-07-04 DIAGNOSIS — Z1239 Encounter for other screening for malignant neoplasm of breast: Secondary | ICD-10-CM | POA: Diagnosis not present

## 2015-07-04 DIAGNOSIS — Z0000 Encounter for general adult medical examination without abnormal findings: Secondary | ICD-10-CM

## 2015-07-04 LAB — LIPID PANEL
CHOLESTEROL: 157 mg/dL (ref 0–200)
HDL: 43.9 mg/dL (ref >39.00)
LDL Cholesterol: 94 mg/dL (ref 0–99)
NonHDL: 113.35
TRIGLYCERIDES: 96 mg/dL (ref 0.0–149.0)
Total CHOL/HDL Ratio: 4
VLDL: 19.2 mg/dL (ref 0.0–40.0)

## 2015-07-04 LAB — CBC
HCT: 43.2 % (ref 36.0–46.0)
Hemoglobin: 13.6 g/dL (ref 12.0–15.0)
MCHC: 31.4 g/dL (ref 30.0–36.0)
MCV: 73.1 fl — ABNORMAL LOW (ref 78.0–100.0)
PLATELETS: 203 10*3/uL (ref 150.0–400.0)
RBC: 5.92 Mil/uL — AB (ref 3.87–5.11)
RDW: 15.3 % (ref 11.5–15.5)
WBC: 5.7 10*3/uL (ref 4.0–10.5)

## 2015-07-04 LAB — COMPREHENSIVE METABOLIC PANEL
ALBUMIN: 3.6 g/dL (ref 3.5–5.2)
ALK PHOS: 68 U/L (ref 39–117)
ALT: 14 U/L (ref 0–35)
AST: 12 U/L (ref 0–37)
BILIRUBIN TOTAL: 0.6 mg/dL (ref 0.2–1.2)
BUN: 19 mg/dL (ref 6–23)
CALCIUM: 8.7 mg/dL (ref 8.4–10.5)
CO2: 27 mEq/L (ref 19–32)
CREATININE: 0.97 mg/dL (ref 0.40–1.20)
Chloride: 105 mEq/L (ref 96–112)
GFR: 79.09 mL/min (ref >60.00)
Glucose, Bld: 88 mg/dL (ref 70–99)
Potassium: 4 mEq/L (ref 3.5–5.1)
Sodium: 138 mEq/L (ref 135–145)
TOTAL PROTEIN: 6.5 g/dL (ref 6.0–8.3)

## 2015-07-04 LAB — HEMOGLOBIN A1C: HEMOGLOBIN A1C: 5.3 % (ref 4.6–6.5)

## 2015-07-04 LAB — TSH: TSH: 3.11 u[IU]/mL (ref 0.35–4.50)

## 2015-07-04 NOTE — Progress Notes (Signed)
Pre visit review using our clinic review tool, if applicable. No additional management support is needed unless otherwise documented below in the visit note. 

## 2015-07-04 NOTE — Patient Instructions (Signed)

## 2015-07-04 NOTE — Progress Notes (Signed)
Subjective:    Patient ID: Valerie Andrews, female    DOB: 05-May-1966, 49 y.o.   MRN: [id number]  HPI  Pt presents to the clinic today for her annual exam.  Flu: 08/2014 Tetanus: She thinks it was around 2008-2009 LMP: 8/17, regular Pap Smear: 2014, normal Mammogram: 2013 at [facility] Screening: as needed Dentist: as needeed  Diet: She eats whatever she wants to eat. She eats fruits and veggies 2 x week. She does consume some fried. She drinks mostly water and diet coke. Exercise: None  Review of Systems      Past Medical History  Diagnosis Date  . Arthritis   . GERD (gastroesophageal reflux disease)   . Chicken pox     Current Outpatient Prescriptions  Medication Sig Dispense Refill  . Acetaminophen 500 MG coapsule Take 500 mg by mouth every 4 (four) hours as needed for pain.     . celecoxib (CELEBREX) 200 MG capsule Take 200 mg by mouth 2 (two) times daily.     . meloxicam (MOBIC) 15 MG tablet Take 15 mg by mouth daily.    . metroNIDAZOLE (FLAGYL) 500 MG tablet Take 1 tablet (500 mg total) by mouth 2 (two) times daily. 14 tablet 0  . ranitidine (ZANTAC) 150 MG capsule Take 1 capsule (150 mg total) by mouth every evening. 30 capsule 2   No current facility-administered medications for this visit.    No Known Allergies  Family History  Problem Relation Age of Onset  . Hypertension Mother   . Stroke Father   . Diabetes Paternal Grandmother   . Cancer Neg Hx     Social History   Social History  . Marital Status: Single    Spouse Name: N/A  . Number of Children: N/A  . Years of Education: N/A   Occupational History  . Not on file.   Social History Main Topics  . Smoking status: Never Smoker   . Smokeless tobacco: Not on file  . Alcohol Use: No  . Drug Use: No  . Sexual Activity: Not Currently   Other Topics Concern  . Not on file   Social History Narrative     Constitutional: Denies fever, malaise, fatigue, headache or abrupt weight  changes.  HEENT: Denies eye pain, eye redness, ear pain, ringing in the ears, wax buildup, runny nose, nasal congestion, bloody nose, or sore throat. Respiratory: Denies difficulty breathing, shortness of breath, cough or sputum production.   Cardiovascular: Denies chest pain, chest tightness, palpitations or swelling in the hands or feet.  Gastrointestinal: Denies abdominal pain, bloating, constipation, diarrhea or blood in the stool.  GU: Denies urgency, frequency, pain with urination, burning sensation, blood in urine, odor or discharge. Musculoskeletal: Pt reports bilateral knee pain. Denies muscle pain.  Skin: Denies redness, rashes, lesions or ulcercations.  Neurological: Denies dizziness, difficulty with memory, difficulty with speech or problems with balance and coordination.  Psych: Denies anxiety, depression, SI/HI.  No other specific complaints in a complete review of systems (except as listed in HPI above).  Objective:   Physical Exam   BP 108/72 mmHg  Pulse 93  Temp(Src) 98.4 F (36.9 C) (Oral)  Ht 5\' 6"  (1.676 m)  Wt 287 lb (130.182 kg)  BMI 46.35 kg/m2  SpO2 98%  LMP 06/26/2015 Wt Readings from Last 3 Encounters:  07/04/15 287 lb (130.182 kg)  04/25/15 288 lb (130.636 kg)    General: Appears her stated age, obese in NAD. Skin: Warm, dry and intact.  No rashes, lesions or ulcerations noted. HEENT: Head: normal shape and size; Eyes: sclera white, no icterus, conjunctiva pink, PERRLA and EOMs intact; Ears: Tm's gray and intact, normal light reflex, some wax buildup noted; Throat/Mouth: Teeth present, mucosa pink and moist, no exudate, lesions or ulcerations noted.  Neck: Neck supple, trachea midline. No masses, lumps or thyromegaly present.  Cardiovascular: Normal rate and rhythm. S1,S2 noted.  No murmur, rubs or gallops noted. Trace BLE edema. No carotid bruits noted. Pulmonary/Chest: Normal effort and positive vesicular breath sounds. No respiratory distress. No  wheezes, rales or ronchi noted.  Abdomen: Soft and nontender. Normal bowel sounds, no bruits noted. No distention or masses noted. Liver, spleen and kidneys non palpable. Musculoskeletal: Normal flexion and extension of the knees. No joint swelling noted. Her gait is slow but steady.  Neurological: Alert and oriented. Cranial nerves II-XII grossly intact. Coordination normal.  Psychiatric: Mood and affect normal. Behavior is normal. Judgment and thought content normal.    BMET    Component Value Date/Time   NA 136 04/17/2015 0937   K 4.1 04/17/2015 0937   CL 101 04/17/2015 0937   CO2 29 04/17/2015 0937   GLUCOSE 91 04/17/2015 0937   BUN 14 04/17/2015 0937   CREATININE 0.96 04/17/2015 0937   CALCIUM 8.7* 04/17/2015 0937   GFRNONAA >60 04/17/2015 0937   GFRAA >60 04/17/2015 0937    Lipid Panel  No results found for: CHOL, TRIG, HDL, CHOLHDL, VLDL, LDLCALC  CBC    Component Value Date/Time   WBC 4.4 04/17/2015 0937   RBC 5.86* 04/17/2015 0937   HGB 13.5 04/17/2015 0937   HCT 41.0 04/17/2015 0937   PLT 144* 04/17/2015 0937   MCV 70.0* 04/17/2015 0937   MCH 23.0* 04/17/2015 0937   MCHC 32.9 04/17/2015 0937   RDW 14.2 04/17/2015 0937   LYMPHSABS 2.2 04/17/2015 0937   MONOABS 0.3 04/17/2015 0937   EOSABS 0.1 04/17/2015 0937   BASOSABS 0.0 04/17/2015 0937    Hgb A1C No results found for: HGBA1C      Assessment & Plan:   Preventative Health Maintenance:  Encouraged her to work on diet and exercise Advised her to get a flu shot in the fall 2016 Pap Smear due 2017 Mammogram ordered- she will call [facility] to schedule Advised her to see an eye doctor and dentist at least annually  RTC in 1 year for annual exam/followup

## 2015-07-05 ENCOUNTER — Encounter: Payer: Self-pay | Admitting: *Deleted

## 2015-07-18 ENCOUNTER — Other Ambulatory Visit: Payer: Self-pay

## 2015-07-18 MED ORDER — MELOXICAM 15 MG PO TABS: 15.0000 mg | ORAL_TABLET | Freq: Every day | ORAL | Status: DC

## 2015-07-18 NOTE — Telephone Encounter (Signed)
Pt request refill meloxicam at [facility] for bilateral knee pain. Avie Echevaria NP has never prescribed before; pt no longer sees Dr Marry Guan who initially prescribed med. Last annual exam on 07/04/15. Please advise.

## 2015-07-18 NOTE — Telephone Encounter (Signed)
Medication sent electronically 

## 2015-08-26 ENCOUNTER — Emergency Department
Admission: EM | Admit: 2015-08-26 | Discharge: 2015-08-26 | Disposition: A | Discharge status: Home / Self Care | Payer: 59 | Attending: Emergency Medicine | Admitting: Emergency Medicine

## 2015-08-26 ENCOUNTER — Emergency Department: Payer: 59

## 2015-08-26 DIAGNOSIS — Z791 Long term (current) use of non-steroidal anti-inflammatories (NSAID): Secondary | ICD-10-CM | POA: Insufficient documentation

## 2015-08-26 DIAGNOSIS — S8991XA Unspecified injury of right lower leg, initial encounter: Secondary | ICD-10-CM | POA: Diagnosis present

## 2015-08-26 DIAGNOSIS — Y92009 Unspecified place in unspecified non-institutional (private) residence as the place of occurrence of the external cause: Secondary | ICD-10-CM | POA: Diagnosis not present

## 2015-08-26 DIAGNOSIS — Y998 Other external cause status: Secondary | ICD-10-CM | POA: Insufficient documentation

## 2015-08-26 DIAGNOSIS — X58XXXA Exposure to other specified factors, initial encounter: Secondary | ICD-10-CM | POA: Diagnosis not present

## 2015-08-26 DIAGNOSIS — Y9389 Activity, other specified: Secondary | ICD-10-CM | POA: Diagnosis not present

## 2015-08-26 DIAGNOSIS — Z79899 Other long term (current) drug therapy: Secondary | ICD-10-CM | POA: Diagnosis not present

## 2015-08-26 DIAGNOSIS — M1711 Unilateral primary osteoarthritis, right knee: Secondary | ICD-10-CM | POA: Diagnosis not present

## 2015-08-26 DIAGNOSIS — M25561 Pain in right knee: Secondary | ICD-10-CM

## 2015-08-26 MED ORDER — TRAMADOL HCL 50 MG PO TABS
50.0000 mg | ORAL_TABLET | Freq: Once | ORAL | Status: AC
  Administered 2015-08-26: 50 mg via ORAL
  Filled 2015-08-26: qty 1

## 2015-08-26 MED ORDER — TRAMADOL HCL 50 MG PO TABS: 50.0000 mg | ORAL_TABLET | Freq: Four times a day (QID) | ORAL | Status: DC | PRN

## 2015-08-26 NOTE — ED Notes (Signed)
Pt states she was moving to the bathroom when she heard her right knee "pop"

## 2015-08-26 NOTE — ED Notes (Signed)
Note cane not available, talked to the provider a walker is ok, provided a walker.

## 2015-08-26 NOTE — ED Notes (Signed)
Right Knee pain on palpitation, left and right bilateral shin pain and edema present

## 2015-08-26 NOTE — ED Provider Notes (Signed)
[facility] Emergency Department Provider Note  ____________________________________________  Time seen: Approximately 222 AM  I have reviewed the triage vital signs and the nursing notes.   HISTORY  Chief Complaint Knee Pain    HPI Valerie Andrews is a 49 y.o. female who was sitting at home on the couch. She reports that she was trying to get up to go to the bathroom and heard her right knee pop. She reports that when her knee made a noise it started hurting. The patient reports that she laid back down. The patient typically takes meloxicam and reports that she had not taken it all day. She took the meloxicam on her way here and then came in for evaluation. The patient reports that her knee hurts to ambulate. The patient did not ice her knee or elevate her knee. She reports that there is some swelling as well and it hurts to move. When laying straight her pain is a 2 out of 10 in intensity. The patient denies any trauma to her knee.   Past Medical History  Diagnosis Date  . Arthritis   . GERD (gastroesophageal reflux disease)   . Chicken pox     Patient Active Problem List   Diagnosis Date Noted  . Esophageal reflux 04/25/2015  . Arthritis of both knees 04/25/2015  . Severe obesity (BMI >= 40) ([place]) 04/25/2015    No past surgical history on file.  Current Outpatient Rx  Name  Route  Sig  Dispense  Refill  . Acetaminophen 500 MG coapsule   Oral   Take 500 mg by mouth every 4 (four) hours as needed for pain.          . celecoxib (CELEBREX) 200 MG capsule   Oral   Take 200 mg by mouth 2 (two) times daily.          . meloxicam (MOBIC) 15 MG tablet   Oral   Take 1 tablet (15 mg total) by mouth daily.   30 tablet   5   . ranitidine (ZANTAC) 150 MG capsule   Oral   Take 1 capsule (150 mg total) by mouth every evening.   30 capsule   2   . traMADol (ULTRAM) 50 MG tablet   Oral   Take 1 tablet (50 mg total) by mouth every 6 (six) hours as  needed.   12 tablet   0     Allergies Review of patient's allergies indicates no known allergies.  Family History  Problem Relation Age of Onset  . Hypertension Mother   . Stroke Father   . Diabetes Paternal Grandmother   . Cancer Neg Hx     Social History Social History  Substance Use Topics  . Smoking status: Never Smoker   . Smokeless tobacco: Not on file  . Alcohol Use: No    Review of Systems Constitutional: No fever/chills Eyes: No visual changes. ENT: No sore throat. Cardiovascular: Denies chest pain. Respiratory: Denies shortness of breath. Gastrointestinal: No abdominal pain.  No nausea, no vomiting.  No diarrhea.  No constipation. Genitourinary: Negative for dysuria. Musculoskeletal: Right knee pain. Skin: Negative for rash. Neurological: Negative for headaches, focal weakness or numbness.  10-point ROS otherwise negative.  ____________________________________________   PHYSICAL EXAM:  VITAL SIGNS: ED Triage Vitals  Enc Vitals Group     BP 08/26/15 0121 121/66 mmHg     Pulse Rate 08/26/15 0121 82     Resp 08/26/15 0121 16  Temp 08/26/15 0121 97.9 F (36.6 C)     Temp Source 08/26/15 0121 Oral     SpO2 08/26/15 0121 100 %     Weight 08/26/15 0121 270 lb (122.471 kg)     Height 08/26/15 0121 5\' 7"  (1.702 m)     Head Cir --      Peak Flow --      Pain Score 08/26/15 0121 10     Pain Loc --      Pain Edu? --      Excl. in [place]? --     Constitutional: Alert and oriented. Well appearing and in mild distress. Eyes: Conjunctivae are normal. PERRL. EOMI. Head: Atraumatic. Nose: No congestion/rhinnorhea. Mouth/Throat: Mucous membranes are moist.  Oropharynx non-erythematous. Cardiovascular: Normal rate, regular rhythm. Grossly normal heart sounds.  Good peripheral circulation. Respiratory: Normal respiratory effort.  No retractions. Lungs CTAB. Gastrointestinal: Soft and nontender. No distention. Positive bowel sounds Musculoskeletal: Pain with  passive range of motion of the patient's right knee. No visible swelling but the patient does have some obesity and it is difficult to ascertain swelling in the patient's right knee. The patient does have some mild tenderness to palpation along her bilateral joint lines. No anterior or posterior drawer sign at this time. Neurologic:  Normal speech and language.  Skin:  Skin is warm, dry and intact.  Psychiatric: Mood and affect are normal.   ____________________________________________   LABS (all labs ordered are listed, but only abnormal results are displayed)  Labs Reviewed - No data to display ____________________________________________  EKG  None ____________________________________________  RADIOLOGY  Right knee x-ray: Advanced for age osteoarthritis without acute fracture or subluxation. ____________________________________________   PROCEDURES  Procedure(s) performed: None  Critical Care performed: No  ____________________________________________   INITIAL IMPRESSION / ASSESSMENT AND PLAN / ED COURSE  Pertinent labs & imaging results that were available during my care of the patient were reviewed by me and considered in my medical decision making (see chart for details).  This is a 49 year old female who comes in with right knee pain after standing and hearing a pop. The patient does have some severe osteoarthritis on her x-ray with some bone on bone. I did place her in a knee immobilizer and gave her a dose of tramadol for pain. The plan was to give the patient a cane but we did not have any so she was given a walker to help with ambulation. The patient needs to be seen by orthopedic surgery. I will discharge the patient home to have her follow up with orthopedic surgery. ____________________________________________   FINAL CLINICAL IMPRESSION(S) / ED DIAGNOSES  Final diagnoses:  Right knee pain      Loney Hering, MD 08/26/15 [phone]

## 2015-08-26 NOTE — ED Notes (Signed)
Patient transported to X-ray 

## 2015-08-26 NOTE — Discharge Instructions (Signed)

## 2015-08-26 NOTE — ED Notes (Signed)
Pt states tonight she was walking and her rt knee popped and gave way, pt states she has been unable to put weight on it since

## 2015-08-29 ENCOUNTER — Ambulatory Visit
Admission: RE | Admit: 2015-08-29 | Discharge: 2015-08-29 | Disposition: A | Discharge status: Home / Self Care | Payer: 59 | Source: Ambulatory Visit | Attending: Internal Medicine | Admitting: Internal Medicine

## 2015-08-29 ENCOUNTER — Other Ambulatory Visit: Payer: Self-pay | Admitting: Internal Medicine

## 2015-08-29 DIAGNOSIS — Z1231 Encounter for screening mammogram for malignant neoplasm of breast: Secondary | ICD-10-CM | POA: Diagnosis present

## 2015-08-29 DIAGNOSIS — Z1239 Encounter for other screening for malignant neoplasm of breast: Secondary | ICD-10-CM

## 2015-09-03 ENCOUNTER — Ambulatory Visit (INDEPENDENT_AMBULATORY_CARE_PROVIDER_SITE_OTHER): Payer: 59 | Admitting: Internal Medicine

## 2015-09-03 ENCOUNTER — Encounter: Payer: Self-pay | Admitting: Internal Medicine

## 2015-09-03 VITALS — BP 120/82 | HR 76 | Temp 98.3°F | Wt 281.0 lb

## 2015-09-03 DIAGNOSIS — M170 Bilateral primary osteoarthritis of knee: Secondary | ICD-10-CM

## 2015-09-03 MED ORDER — TRAMADOL HCL 50 MG PO TABS: 50.0000 mg | ORAL_TABLET | Freq: Two times a day (BID) | ORAL | Status: DC | PRN

## 2015-09-03 NOTE — Patient Instructions (Signed)
Generic Knee Exercises EXERCISES RANGE OF MOTION (ROM) AND STRETCHING EXERCISES These exercises may help you when beginning to rehabilitate your injury. Your symptoms may resolve with or without further involvement from your physician, physical therapist, or athletic trainer. While completing these exercises, remember:   Restoring tissue flexibility helps normal motion to return to the joints. This allows healthier, less painful movement and activity.  An effective stretch should be held for at least 30 seconds.  A stretch should never be painful. You should only feel a gentle lengthening or release in the stretched tissue. STRETCH - Knee Extension, Prone  Lie on your stomach on a firm surface, such as a bed or countertop. Place your right / left knee and leg just beyond the edge of the surface. You may wish to place a towel under the far end of your right / left thigh for comfort.  Relax your leg muscles and allow gravity to straighten your knee. Your clinician may advise you to add an ankle weight if more resistance is helpful for you.  You should feel a stretch in the back of your right / left knee. Hold this position for __________ seconds. Repeat __________ times. Complete this stretch __________ times per day. * Your physician, physical therapist, or athletic trainer may ask you to add ankle weight to enhance your stretch.  RANGE OF MOTION - Knee Flexion, Active  Lie on your back with both knees straight. (If this causes back discomfort, bend your opposite knee, placing your foot flat on the floor.)  Slowly slide your heel back toward your buttocks until you feel a gentle stretch in the front of your knee or thigh.  Hold for __________ seconds. Slowly slide your heel back to the starting position. Repeat __________ times. Complete this exercise __________ times per day.  STRETCH - Quadriceps, Prone   Lie on your stomach on a firm surface, such as a bed or padded floor.  Bend your  right / left knee and grasp your ankle. If you are unable to reach your ankle or pant leg, use a belt around your foot to lengthen your reach.  Gently pull your heel toward your buttocks. Your knee should not slide out to the side. You should feel a stretch in the front of your thigh and/or knee.  Hold this position for __________ seconds. Repeat __________ times. Complete this stretch __________ times per day.  STRETCH - Hamstrings, Supine   Lie on your back. Loop a belt or towel over the ball of your right / left foot.  Straighten your right / left knee and slowly pull on the belt to raise your leg. Do not allow the right / left knee to bend. Keep your opposite leg flat on the floor.  Raise the leg until you feel a gentle stretch behind your right / left knee or thigh. Hold this position for __________ seconds. Repeat __________ times. Complete this stretch __________ times per day.  STRENGTHENING EXERCISES These exercises may help you when beginning to rehabilitate your injury. They may resolve your symptoms with or without further involvement from your physician, physical therapist, or athletic trainer. While completing these exercises, remember:   Muscles can gain both the endurance and the strength needed for everyday activities through controlled exercises.  Complete these exercises as instructed by your physician, physical therapist, or athletic trainer. Progress the resistance and repetitions only as guided.  You may experience muscle soreness or fatigue, but the pain or discomfort you are trying to   eliminate should never worsen during these exercises. If this pain does worsen, stop and make certain you are following the directions exactly. If the pain is still present after adjustments, discontinue the exercise until you can discuss the trouble with your clinician. STRENGTH - Quadriceps, Isometrics  Lie on your back with your right / left leg extended and your opposite knee  bent.  Gradually tense the muscles in the front of your right / left thigh. You should see either your knee cap slide up toward your hip or increased dimpling just above the knee. This motion will push the back of the knee down toward the floor/mat/bed on which you are lying.  Hold the muscle as tight as you can without increasing your pain for __________ seconds.  Relax the muscles slowly and completely in between each repetition. Repeat __________ times. Complete this exercise __________ times per day.  STRENGTH - Quadriceps, Short Arcs   Lie on your back. Place a __________ inch towel roll under your knee so that the knee slightly bends.  Raise only your lower leg by tightening the muscles in the front of your thigh. Do not allow your thigh to rise.  Hold this position for __________ seconds. Repeat __________ times. Complete this exercise __________ times per day.  OPTIONAL ANKLE WEIGHTS: Begin with ____________________, but DO NOT exceed ____________________. Increase in 1 pound/0.5 kilogram increments.  STRENGTH - Quadriceps, Straight Leg Raises  Quality counts! Watch for signs that the quadriceps muscle is working to insure you are strengthening the correct muscles and not "cheating" by substituting with healthier muscles.  Lay on your back with your right / left leg extended and your opposite knee bent.  Tense the muscles in the front of your right / left thigh. You should see either your knee cap slide up or increased dimpling just above the knee. Your thigh may even quiver.  Tighten these muscles even more and raise your leg 4 to 6 inches off the floor. Hold for __________ seconds.  Keeping these muscles tense, lower your leg.  Relax the muscles slowly and completely in between each repetition. Repeat __________ times. Complete this exercise __________ times per day.  STRENGTH - Hamstring, Curls  Lay on your stomach with your legs extended. (If you lay on a bed, your feet  may hang over the edge.)  Tighten the muscles in the back of your thigh to bend your right / left knee up to 90 degrees. Keep your hips flat on the bed/floor.  Hold this position for __________ seconds.  Slowly lower your leg back to the starting position. Repeat __________ times. Complete this exercise __________ times per day.  OPTIONAL ANKLE WEIGHTS: Begin with ____________________, but DO NOT exceed ____________________. Increase in 1 pound/0.5 kilogram increments.  STRENGTH - Quadriceps, Squats  Stand in a door frame so that your feet and knees are in line with the frame.  Use your hands for balance, not support, on the frame.  Slowly lower your weight, bending at the hips and knees. Keep your lower legs upright so that they are parallel with the door frame. Squat only within the range that does not increase your knee pain. Never let your hips drop below your knees.  Slowly return upright, pushing with your legs, not pulling with your hands. Repeat __________ times. Complete this exercise __________ times per day.  STRENGTH - Quadriceps, Wall Slides  Follow guidelines for form closely. Increased knee pain often results from poorly placed feet or knees.    Lean against a smooth wall or door and walk your feet out 18-24 inches. Place your feet hip-width apart.  Slowly slide down the wall or door until your knees bend __________ degrees.* Keep your knees over your heels, not your toes, and in line with your hips, not falling to either side.  Hold for __________ seconds. Stand up to rest for __________ seconds in between each repetition. Repeat __________ times. Complete this exercise __________ times per day. * Your physician, physical therapist, or athletic trainer will alter this angle based on your symptoms and progress.   This information is not intended to replace advice given to you by your health care provider. Make sure you discuss any questions you have with your health care  provider.   Document Released: 09/09/2005 Document Revised: 11/16/2014 Document Reviewed: 02/07/2009 [employer] Interactive Patient Education 2016 [employer]  

## 2015-09-03 NOTE — Progress Notes (Signed)
Pre visit review using our clinic review tool, if applicable. No additional management support is needed unless otherwise documented below in the visit note. 

## 2015-09-03 NOTE — Progress Notes (Signed)
Subjective:    Patient ID: Valerie Andrews, female    DOB: November 05, 1966, 49 y.o.   MRN: [id number]  HPI  Pt presents to the clinic today for ER follow up for right knee pain. She was getting out of bed 10/19 to go to the restroom. She heard a "pop" and had sudden onset right knee pain. Xray of the right knee in the ER showed severe age advanced osteoarthritis of the right knee. She was given Tramadol and advised to follow up with her PCP for a referral to orthopedic surgery. She reports she see Dr. Marry Guan at [facility]. She has noticed some improvement in her knee pain with the Tramadol. She takes Meloxicam daily. She does not take Celebrex that is listed on her med list.  Review of Systems      Past Medical History  Diagnosis Date  . Arthritis   . GERD (gastroesophageal reflux disease)   . Chicken pox     Current Outpatient Prescriptions  Medication Sig Dispense Refill  . Acetaminophen 500 MG coapsule Take 500 mg by mouth every 4 (four) hours as needed for pain.     . celecoxib (CELEBREX) 200 MG capsule Take 200 mg by mouth 2 (two) times daily.     . meloxicam (MOBIC) 15 MG tablet Take 1 tablet (15 mg total) by mouth daily. 30 tablet 5  . ranitidine (ZANTAC) 150 MG capsule Take 1 capsule (150 mg total) by mouth every evening. 30 capsule 2  . traMADol (ULTRAM) 50 MG tablet Take 1 tablet (50 mg total) by mouth every 6 (six) hours as needed. 12 tablet 0   No current facility-administered medications for this visit.    No Known Allergies  Family History  Problem Relation Age of Onset  . Hypertension Mother   . Stroke Father   . Diabetes Paternal Grandmother   . Cancer Neg Hx   . Breast cancer Neg Hx     Social History   Social History  . Marital Status: Single    Spouse Name: N/A  . Number of Children: N/A  . Years of Education: N/A   Occupational History  . Not on file.   Social History Main Topics  . Smoking status: Never Smoker   . Smokeless tobacco: Not on file  .  Alcohol Use: No  . Drug Use: No  . Sexual Activity: Not Currently   Other Topics Concern  . Not on file   Social History Narrative     Constitutional: Denies fever, malaise, fatigue, headache or abrupt weight changes.  Respiratory: Denies difficulty breathing, shortness of breath, cough or sputum production.   Cardiovascular: Denies chest pain, chest tightness, palpitations or swelling in the hands or feet. . Musculoskeletal: Pt reports bilateral knee pain. Denies difficulty with gait, muscle pain or joint swelling.    No other specific complaints in a complete review of systems (except as listed in HPI above).  Objective:   Physical Exam  BP 120/82 mmHg  Pulse 76  Temp(Src) 98.3 F (36.8 C) (Oral)  Wt 281 lb (127.461 kg)  SpO2 97%  LMP 08/12/2015 Wt Readings from Last 3 Encounters:  09/03/15 281 lb (127.461 kg)  08/26/15 270 lb (122.471 kg)  07/04/15 287 lb (130.182 kg)    General: Appears her stated age, obese in NAD.  Cardiovascular: Normal rate and rhythm. S1,S2 noted.   Pulmonary/Chest: Normal effort and positive vesicular breath sounds. No respiratory distress. No wheezes, rales or ronchi noted.  Musculoskeletal: Normal flexion  and extension of the bilateral knees. Crepitus noted with ROM. Pain with palpation of the medial joint lines. No signs of joint swelling. Strength 5/5 BUE/BLE. No difficulty with gait.    BMET    Component Value Date/Time   NA 138 07/04/2015 1450   K 4.0 07/04/2015 1450   CL 105 07/04/2015 1450   CO2 27 07/04/2015 1450   GLUCOSE 88 07/04/2015 1450   BUN 19 07/04/2015 1450   CREATININE 0.97 07/04/2015 1450   CALCIUM 8.7 07/04/2015 1450   GFRNONAA >60 04/17/2015 0937   GFRAA >60 04/17/2015 0937    Lipid Panel     Component Value Date/Time   CHOL 157 07/04/2015 1450   TRIG 96.0 07/04/2015 1450   HDL 43.90 07/04/2015 1450   CHOLHDL 4 07/04/2015 1450   VLDL 19.2 07/04/2015 1450   LDLCALC 94 07/04/2015 1450    CBC      Component Value Date/Time   WBC 5.7 07/04/2015 1450   RBC 5.92* 07/04/2015 1450   HGB 13.6 07/04/2015 1450   HCT 43.2 07/04/2015 1450   PLT 203.0 07/04/2015 1450   MCV 73.1* 07/04/2015 1450   MCH 23.0* 04/17/2015 0937   MCHC 31.4 07/04/2015 1450   RDW 15.3 07/04/2015 1450   LYMPHSABS 2.2 04/17/2015 0937   MONOABS 0.3 04/17/2015 0937   EOSABS 0.1 04/17/2015 0937   BASOSABS 0.0 04/17/2015 0937    Hgb A1C Lab Results  Component Value Date   HGBA1C 5.3 07/04/2015         Assessment & Plan:   ER followup for OA of right knee but has it in bilateral knees:  Hospital notes and imaging reviewed Celebrex d/c'd off med list Continue Meloxicam daily Tramadol as needed for severe pain, refilled today She already sees Dr. Marry Guan, she will make follow up appt with him Discussed how weight loss could help improve her symptoms  RTC as needed or if symptoms persist or worsen

## 2015-09-03 NOTE — Progress Notes (Signed)
   Subjective:    Patient ID: Valerie Andrews, female    DOB: 05-06-1966, 49 y.o.   MRN: [id number]  HPI  Valerie Andrews is a 49 year old female who presents today for ED follow up.  She went to [facility] ED on 10/17 for right knee pain.  She was sitting down and when she stood up she heard a pop. An X ray of the right knee revealed that she has advanced for age osteoarthritis.  She says that she sees Dr. Marry Guan with [facility] who advised her to lose weight.   She was given tramadol in the ED and says that helps the pain.    Review of Systems  Constitutional: Negative for fever, chills and diaphoresis.  HENT: Negative.   Respiratory: Negative for cough, shortness of breath and wheezing.   Cardiovascular: Negative for chest pain, palpitations and leg swelling.  Genitourinary: Negative for dysuria, frequency and flank pain.  Musculoskeletal: Positive for arthralgias. Negative for myalgias and back pain.  Neurological: Negative for dizziness, light-headedness and headaches.  Psychiatric/Behavioral: Negative for confusion and agitation. The patient is not nervous/anxious.    Family History  Problem Relation Age of Onset  . Hypertension Mother   . Stroke Father   . Diabetes Paternal Grandmother   . Cancer Neg Hx   . Breast cancer Neg Hx    Current Outpatient Prescriptions on File Prior to Visit  Medication Sig Dispense Refill  . Acetaminophen 500 MG coapsule Take 500 mg by mouth every 4 (four) hours as needed for pain.     . meloxicam (MOBIC) 15 MG tablet Take 1 tablet (15 mg total) by mouth daily. 30 tablet 5  . ranitidine (ZANTAC) 150 MG capsule Take 1 capsule (150 mg total) by mouth every evening. 30 capsule 2  . traMADol (ULTRAM) 50 MG tablet Take 1 tablet (50 mg total) by mouth every 6 (six) hours as needed. 12 tablet 0   No current facility-administered medications on file prior to visit.        Objective:   Physical Exam  Constitutional: She is oriented to person, place, and time. She  appears well-developed and well-nourished.  HENT:  Head: Normocephalic and atraumatic.  Neck: Normal range of motion. Neck supple.  Cardiovascular: Normal rate, regular rhythm, normal heart sounds and intact distal pulses.   No murmur heard. Pulmonary/Chest: Effort normal and breath sounds normal. She has no wheezes.  Musculoskeletal: She exhibits tenderness (bilateral knees). She exhibits no edema.  Right and left knees have normal strength, normal range of motion.  Lymphadenopathy:    She has no cervical adenopathy.  Neurological: She is alert and oriented to person, place, and time.  Skin: Skin is dry.  Psychiatric: She has a normal mood and affect.  BP 120/82 mmHg  Pulse 76  Temp(Src) 98.3 F (36.8 C) (Oral)  Wt 281 lb (127.461 kg)  SpO2 97%  LMP 08/12/2015         Assessment & Plan:  1. Bilateral knee pain Rx for tramadol prn pain. Continue taking meloxicam daily. Expect that she will need bilateral knee replacements.   Advised patient that she will need to lose weight - 5% of total body weight at least to optimize joints.  She understands.

## 2015-10-24 ENCOUNTER — Telehealth: Payer: Self-pay

## 2015-10-24 DIAGNOSIS — M170 Bilateral primary osteoarthritis of knee: Secondary | ICD-10-CM

## 2015-10-24 NOTE — Telephone Encounter (Signed)
I don't give anything stronger than this. She can try seeing if Dr. Marry Guan can prescribe her something stronger.

## 2015-10-24 NOTE — Telephone Encounter (Signed)
Pt left v/m requesting stronger pain med to [facility]. Unable to reach pt for more details.last seen for bilateral knee pain and tramadol # 60 last printed on 09/03/15.Please advise.

## 2015-10-25 MED ORDER — TRAMADOL HCL 50 MG PO TABS: 50.0000 mg | ORAL_TABLET | Freq: Two times a day (BID) | ORAL | Status: DC | PRN

## 2015-10-25 NOTE — Telephone Encounter (Signed)
Tried to reach pt but home # does not have VM and other number listed in DPR had an error msg

## 2015-10-25 NOTE — Telephone Encounter (Signed)
Spoke with pt and pt is aware as instructed--pt wants to continue Tramadol--per verbal order from [facility] to refill Tramadol---Rx called into pharmacy

## 2015-10-25 NOTE — Addendum Note (Signed)
Addended by: Lurlean Nanny on: 10/25/2015 11:38 AM   Modules accepted: Orders

## 2015-12-26 ENCOUNTER — Other Ambulatory Visit: Payer: Self-pay | Admitting: Internal Medicine

## 2015-12-26 NOTE — Telephone Encounter (Signed)
Tramadol last filled 10/25/15--please advise

## 2015-12-27 NOTE — Telephone Encounter (Signed)
Mobic sent electronically. Ok to phone in Tramadol.

## 2015-12-27 NOTE — Telephone Encounter (Signed)
Rx called in to pharmacy. 

## 2016-02-13 ENCOUNTER — Encounter: Payer: Self-pay | Admitting: Internal Medicine

## 2016-02-13 ENCOUNTER — Ambulatory Visit (INDEPENDENT_AMBULATORY_CARE_PROVIDER_SITE_OTHER): Payer: 59 | Admitting: Internal Medicine

## 2016-02-13 VITALS — BP 118/74 | HR 85 | Temp 97.9°F | Wt 270.0 lb

## 2016-02-13 DIAGNOSIS — L84 Corns and callosities: Secondary | ICD-10-CM | POA: Diagnosis not present

## 2016-02-13 DIAGNOSIS — M174 Other bilateral secondary osteoarthritis of knee: Secondary | ICD-10-CM | POA: Diagnosis not present

## 2016-02-13 NOTE — Progress Notes (Signed)
Pre visit review using our clinic review tool, if applicable. No additional management support is needed unless otherwise documented below in the visit note. 

## 2016-02-13 NOTE — Progress Notes (Signed)
Subjective:    Patient ID: Valerie Andrews, female    DOB: 08-15-1966, 50 y.o.   MRN: [id number]  HPI  Pt presents to the clinic today with c/o bilateral knee pain, R>L. OA. She also c/o a several day h/o left foot pain. She has recurring calluses on the bottom of her foot that are often painful. She denies injury, walking bare-foot, or trauma. She did have a bilateral cortisone knee injection at [facility] last fall, which helped for a few months. She has an apt next week to discuss other options to control her knee pain outside the Meloxicam and Tramadol which she takes daily.   Review of Systems  Past Medical History  Diagnosis Date  . Arthritis   . GERD (gastroesophageal reflux disease)   . Chicken pox     Current Outpatient Prescriptions  Medication Sig Dispense Refill  . Acetaminophen 500 MG coapsule Take 500 mg by mouth every 4 (four) hours as needed for pain.     . meloxicam (MOBIC) 15 MG tablet TAKE 1 TABLET BY MOUTH DAILY. 30 tablet 5  . ranitidine (ZANTAC) 150 MG capsule Take 1 capsule (150 mg total) by mouth every evening. 30 capsule 2  . traMADol (ULTRAM) 50 MG tablet TAKE ONE TABLET BY MOUTH EVERY 12 HOURS AS NEEDED 60 tablet 0   No current facility-administered medications for this visit.    No Known Allergies  Family History  Problem Relation Age of Onset  . Hypertension Mother   . Stroke Father   . Diabetes Paternal Grandmother   . Cancer Neg Hx   . Breast cancer Neg Hx     Social History   Social History  . Marital Status: Single    Spouse Name: N/A  . Number of Children: N/A  . Years of Education: N/A   Occupational History  . Not on file.   Social History Main Topics  . Smoking status: Never Smoker   . Smokeless tobacco: Not on file  . Alcohol Use: No  . Drug Use: No  . Sexual Activity: Not Currently   Other Topics Concern  . Not on file   Social History Narrative     Constitutional: Denies fever or abrupt weight changes.    Musculoskeletal: Pt reports joint pain and swelling. Denies decrease in range of motion.  Skin: Pt reports callus to bottom of left foot. Denies redness, rashes, or ulcercations.   No other specific complaints in a complete review of systems (except as listed in HPI above).     Objective:   Physical Exam  There were no vitals taken for this visit. Wt Readings from Last 3 Encounters:  09/03/15 281 lb (127.461 kg)  08/26/15 270 lb (122.471 kg)  07/04/15 287 lb (130.182 kg)    General: Appears her stated age,  obese, and in NAD. Skin: Warm, dry and intact. No rashes noted. 2 calluses on bottom of L foot. One mid-sole and one on the ball of the foot. No indications of infection or ulceration.   Musculoskeletal: Normal flexion, extension of bilateral knees. No joint swelling noted. No difficulty with gait.  Neurological: Alert and oriented.    BMET    Component Value Date/Time   NA 138 07/04/2015 1450   K 4.0 07/04/2015 1450   CL 105 07/04/2015 1450   CO2 27 07/04/2015 1450   GLUCOSE 88 07/04/2015 1450   BUN 19 07/04/2015 1450   CREATININE 0.97 07/04/2015 1450   CALCIUM 8.7 07/04/2015 1450  GFRNONAA >60 04/17/2015 0937   GFRAA >60 04/17/2015 0937    Lipid Panel     Component Value Date/Time   CHOL 157 07/04/2015 1450   TRIG 96.0 07/04/2015 1450   HDL 43.90 07/04/2015 1450   CHOLHDL 4 07/04/2015 1450   VLDL 19.2 07/04/2015 1450   LDLCALC 94 07/04/2015 1450    CBC    Component Value Date/Time   WBC 5.7 07/04/2015 1450   RBC 5.92* 07/04/2015 1450   HGB 13.6 07/04/2015 1450   HCT 43.2 07/04/2015 1450   PLT 203.0 07/04/2015 1450   MCV 73.1* 07/04/2015 1450   MCH 23.0* 04/17/2015 0937   MCHC 31.4 07/04/2015 1450   RDW 15.3 07/04/2015 1450   LYMPHSABS 2.2 04/17/2015 0937   MONOABS 0.3 04/17/2015 0937   EOSABS 0.1 04/17/2015 0937   BASOSABS 0.0 04/17/2015 0937    Hgb A1C Lab Results  Component Value Date   HGBA1C 5.3 07/04/2015         Assessment &  Plan:   Knee OA:  Continue Meloxicam and Tramadol Discussed how weight loss would help improve her sympotms F/u with Ortho  Foot Callus:  Soak foot in Epsom salts Refer to podiatry for further evaluation/treatment  RTC as needed or if symptoms persist or worsen

## 2016-02-13 NOTE — Patient Instructions (Signed)
Corns and Calluses Corns are small areas of thickened skin that occur on the top, sides, or tip of a toe. They contain a cone-shaped core with a point that can press on a nerve below. This causes pain. Calluses are areas of thickened skin that can occur anywhere on the body including hands, fingers, palms, soles of the feet, and heels.Calluses are usually larger than corns.  CAUSES  Corns and calluses are caused by rubbing (friction) or pressure, such as from shoes that are too tight or do not fit properly.  RISK FACTORS Corns are more likely to develop in people who have toe deformities, such as hammer toes. Since calluses can occur with friction to any area of the skin, calluses are more likely to develop in people who:   Work with their hands.  Wear shoes that fit poorly, shoes that are too tight, or shoes that are high-heeled.  Have toes deformities. SYMPTOMS Symptoms of a corn or callus include:  A hard growth on the skin.   Pain or tenderness under the skin.   Redness and swelling.   Increased discomfort while wearing tight-fitting shoes. DIAGNOSIS  Corns and calluses may be diagnosed with a medical history and physical exam.  TREATMENT  Corns and calluses may be treated with:  Removing the cause of the friction or pressure. This may include:  Changing your shoes.  Wearing shoe inserts (orthotics) or other protective layers in your shoes, such as a corn pad.  Wearing gloves.  Medicines to help soften skin in the hardened, thickened areas.  Reducing the size of the corn or callus by removing the dead layers of skin.  Antibiotic medicines to treat infection.  Surgery, if a toe deformity is the cause. HOME CARE INSTRUCTIONS   Take medicines only as directed by your health care provider.  If you were prescribed an antibiotic, finish all of it even if you start to feel better.  Wear shoes that fit well. Avoid wearing high-heeled shoes and shoes that are too tight  or too loose.  Wear any padding, protective layers, gloves, or orthotics as directed by your health care provider.  Soak your hands or feet and then use a file or pumice stone to soften your corn or callus. Do this as directed by your health care provider.  Check your corn or callus every day for signs of infection. Watch for:  Redness, swelling, or pain.  Fluid, blood, or pus. SEEK MEDICAL CARE IF:   Your symptoms do not improve with treatment.  You have increased redness, swelling, or pain at the site of your corn or callus.  You have fluid, blood, or pus coming from your corn or callus.  You have new symptoms.   This information is not intended to replace advice given to you by your health care provider. Make sure you discuss any questions you have with your health care provider.   Document Released: 08/01/2004 Document Revised: 03/12/2015 Document Reviewed: 10/22/2014 Elsevier Interactive Patient Education 2016 Elsevier Inc.  

## 2016-02-18 ENCOUNTER — Encounter: Payer: 59 | Admitting: Sports Medicine

## 2016-02-26 NOTE — Progress Notes (Signed)
This encounter was created in error - please disregard.

## 2016-03-04 ENCOUNTER — Other Ambulatory Visit: Payer: Self-pay | Admitting: Internal Medicine

## 2016-03-04 NOTE — Telephone Encounter (Signed)
Ok to phone in Tramadol 

## 2016-03-04 NOTE — Telephone Encounter (Signed)
Last filled 12/27/2015--please advise 

## 2016-03-05 ENCOUNTER — Other Ambulatory Visit: Payer: Self-pay | Admitting: Internal Medicine

## 2016-03-06 NOTE — Telephone Encounter (Signed)
Rx called in to pharmacy. 

## 2016-03-25 ENCOUNTER — Encounter: Payer: Self-pay | Admitting: Physician Assistant

## 2016-03-25 ENCOUNTER — Ambulatory Visit: Payer: Self-pay | Admitting: Physician Assistant

## 2016-03-25 VITALS — BP 120/90 | HR 88 | Temp 98.5°F

## 2016-03-25 DIAGNOSIS — W57XXXA Bitten or stung by nonvenomous insect and other nonvenomous arthropods, initial encounter: Secondary | ICD-10-CM

## 2016-03-25 MED ORDER — MUPIROCIN 2 % EX OINT: TOPICAL_OINTMENT | CUTANEOUS | Status: DC

## 2016-03-25 NOTE — Progress Notes (Signed)
S: c/o red area on left arm at elbow and one above it, unsure if something bit her or not, area is warm to touch, no fever/chills/ denies tick bite  O: vitals wnl, nad, skin with raised hive around insect bite x 2; one is open from patient scratching, warm to touch, no drainage or pustule, n/v intact  A: insect bite  P: otc hydrocortisone cream, bactroban ointment

## 2016-04-28 ENCOUNTER — Other Ambulatory Visit: Payer: Self-pay | Admitting: Internal Medicine

## 2016-04-29 NOTE — Telephone Encounter (Signed)
Last filled 03/04/2016--please advise

## 2016-04-29 NOTE — Telephone Encounter (Signed)
Ok to phone in Tramadol 

## 2016-04-30 NOTE — Telephone Encounter (Signed)
Rx called in to pharmacy. 

## 2016-06-06 ENCOUNTER — Encounter: Payer: Self-pay | Admitting: *Deleted

## 2016-06-06 ENCOUNTER — Emergency Department
Admission: EM | Admit: 2016-06-06 | Discharge: 2016-06-06 | Disposition: A | Discharge status: Home / Self Care | Payer: 59 | Attending: Emergency Medicine | Admitting: Emergency Medicine

## 2016-06-06 DIAGNOSIS — Z791 Long term (current) use of non-steroidal anti-inflammatories (NSAID): Secondary | ICD-10-CM | POA: Diagnosis not present

## 2016-06-06 DIAGNOSIS — M1712 Unilateral primary osteoarthritis, left knee: Secondary | ICD-10-CM | POA: Diagnosis not present

## 2016-06-06 DIAGNOSIS — R300 Dysuria: Secondary | ICD-10-CM | POA: Diagnosis present

## 2016-06-06 DIAGNOSIS — M1711 Unilateral primary osteoarthritis, right knee: Secondary | ICD-10-CM | POA: Diagnosis not present

## 2016-06-06 DIAGNOSIS — N390 Urinary tract infection, site not specified: Secondary | ICD-10-CM | POA: Diagnosis not present

## 2016-06-06 LAB — URINALYSIS COMPLETE WITH MICROSCOPIC (ARMC ONLY)
BILIRUBIN URINE: NEGATIVE
GLUCOSE, UA: NEGATIVE mg/dL
Ketones, ur: NEGATIVE mg/dL
Nitrite: NEGATIVE
Protein, ur: NEGATIVE mg/dL
Specific Gravity, Urine: 1.027 (ref 1.005–1.030)
pH: 5 (ref 5.0–8.0)

## 2016-06-06 LAB — POCT PREGNANCY, URINE: Preg Test, Ur: NEGATIVE

## 2016-06-06 MED ORDER — CIPROFLOXACIN HCL 500 MG PO TABS: 500.0000 mg | ORAL_TABLET | Freq: Two times a day (BID) | ORAL | 0 refills | Status: DC

## 2016-06-06 MED ORDER — PHENAZOPYRIDINE HCL 95 MG PO TABS: 95.0000 mg | ORAL_TABLET | Freq: Three times a day (TID) | ORAL | 0 refills | Status: DC | PRN

## 2016-06-06 NOTE — ED Triage Notes (Signed)
Pt c/o dysuria and frequency since yesterday. Pt states at end of period, has not noticed hematuria but does endorse foul odor to urine.

## 2016-06-06 NOTE — ED Provider Notes (Signed)
[facility] Emergency Department Provider Note  ____________________________________________  Time seen: Approximately 11:06 PM  I have reviewed the triage vital signs and the nursing notes.   HISTORY  Chief Complaint Dysuria    HPI Valerie Andrews is a 50 y.o. female who presents emergency department complaining of polyuria and dysuria 2 days. Patient states that she has at the end of her period but has began to experience dysuria and polyuria. Patient denies any hematuria but does endorse a foul odor. She denies any abdominal pain or flank pain. She denies any nausea or vomiting, diarrhea or constipation, fevers or chills. Patient has had a urinary tract infection in the past and reports that symptoms are consistent with prior UTIs. She states that she is typically treated with Cipro with good results. No other complaints at this time.   Past Medical History:  Diagnosis Date  . Arthritis   . Chicken pox   . GERD (gastroesophageal reflux disease)     Patient Active Problem List   Diagnosis Date Noted  . Esophageal reflux 04/25/2015  . Arthritis of both knees 04/25/2015  . Severe obesity (BMI >= 40) ([place]) 04/25/2015    History reviewed. No pertinent surgical history.  Prior to Admission medications   Medication Sig Start Date End Date Taking? Authorizing Provider  Acetaminophen 500 MG coapsule Take 500 mg by mouth every 4 (four) hours as needed for pain. Reported on 03/25/2016    Historical Provider, MD  ciprofloxacin (CIPRO) 500 MG tablet Take 1 tablet (500 mg total) by mouth 2 (two) times daily. 06/06/16   Charline Bills Cuthriell, PA-C  meloxicam (MOBIC) 15 MG tablet TAKE 1 TABLET BY MOUTH DAILY. 12/27/15   Jearld Fenton, NP  mupirocin ointment Drue Stager) 2 % Apply bid 03/25/16   Versie Starks, PA-C  phenazopyridine (PYRIDIUM) 95 MG tablet Take 1 tablet (95 mg total) by mouth 3 (three) times daily as needed for pain. 06/06/16   Charline Bills Cuthriell, PA-C   ranitidine (ZANTAC) 150 MG capsule Take 1 capsule (150 mg total) by mouth every evening. Patient not taking: Reported on 03/25/2016 04/25/15   Jearld Fenton, NP  traMADol (ULTRAM) 50 MG tablet TAKE ONE TABLET BY MOUTH EVERY 12 HOURS AS NEEDED 04/29/16   Jearld Fenton, NP    Allergies Review of patient's allergies indicates no known allergies.  Family History  Problem Relation Age of Onset  . Hypertension Mother   . Stroke Father   . Diabetes Paternal Grandmother   . Cancer Neg Hx   . Breast cancer Neg Hx     Social History Social History  Substance Use Topics  . Smoking status: Never Smoker  . Smokeless tobacco: Never Used  . Alcohol use No     Review of Systems  Constitutional: No fever/chills Cardiovascular: no chest pain. Respiratory: no cough. No SOB. Gastrointestinal: No abdominal pain.  No nausea, no vomiting.  No diarrhea.  No constipation. Genitourinary: Positive for dysuria and polyuria.. No hematuria. No flank pain. Musculoskeletal: Negative for musculoskeletal pain. Skin: Negative for rash, abrasions, lacerations, ecchymosis. Neurological: Negative for headaches, focal weakness or numbness. 10-point ROS otherwise negative.  ____________________________________________   PHYSICAL EXAM:  VITAL SIGNS: ED Triage Vitals  Enc Vitals Group     BP 06/06/16 2138 123/77     Pulse Rate 06/06/16 2138 95     Resp 06/06/16 2138 20     Temp 06/06/16 2138 98.3 F (36.8 C)     Temp Source  06/06/16 2138 Oral     SpO2 06/06/16 2138 100 %     Weight 06/06/16 2138 250 lb (113.4 kg)     Height 06/06/16 2138 5\' 7"  (1.702 m)     Head Circumference --      Peak Flow --      Pain Score 06/06/16 2139 8     Pain Loc --      Pain Edu? --      Excl. in [place]? --      Constitutional: Alert and oriented. Well appearing and in no acute distress. Eyes: Conjunctivae are normal. PERRL. EOMI. Head: Atraumatic. Cardiovascular: Normal rate, regular rhythm. Normal S1 and S2.  Good  peripheral circulation. Respiratory: Normal respiratory effort without tachypnea or retractions. Lungs CTAB. Good air entry to the bases with no decreased or absent breath sounds. Gastrointestinal: Bowel sounds 4 quadrants. Soft to palpation.She is mainly tender to palpation to her. Region. No guarding or rigidity. No palpable masses. No distention. No CVA tenderness. Musculoskeletal: Full range of motion to all extremities. No gross deformities appreciated. Neurologic:  Normal speech and language. No gross focal neurologic deficits are appreciated.  Skin:  Skin is warm, dry and intact. No rash noted. Psychiatric: Mood and affect are normal. Speech and behavior are normal. Patient exhibits appropriate insight and judgement.   ____________________________________________   LABS (all labs ordered are listed, but only abnormal results are displayed)  Labs Reviewed  URINALYSIS COMPLETEWITH MICROSCOPIC ([place]) - Abnormal; Notable for the following:       Result Value   Color, Urine YELLOW (*)    APPearance CLEAR (*)    Hgb urine dipstick 1+ (*)    Leukocytes, UA TRACE (*)    Bacteria, UA RARE (*)    Squamous Epithelial / LPF 0-5 (*)    All other components within normal limits  POC URINE PREG, ED  POCT PREGNANCY, URINE   ____________________________________________  EKG   ____________________________________________  RADIOLOGY   No results found.  ____________________________________________    PROCEDURES  Procedure(s) performed:    Procedures    Medications - No data to display   ____________________________________________   INITIAL IMPRESSION / ASSESSMENT AND PLAN / ED COURSE  Pertinent labs & imaging results that were available during my care of the patient were reviewed by me and considered in my medical decision making (see chart for details).  Clinical Course    Patient's diagnosis is consistent with UTI. Urinalysis returns with leukocytosis.  Between patient's symptoms and this finding patient will be diagnosed with UTI. Patient will be treated with antibiotics and Pyridium.. Patient is to follow up with primary care provider as needed or otherwise directed. Patient is given ED precautions to return to the ED for any worsening or new symptoms.     ____________________________________________  FINAL CLINICAL IMPRESSION(S) / ED DIAGNOSES  Final diagnoses:  UTI (lower urinary tract infection)      NEW MEDICATIONS STARTED DURING THIS VISIT:  Discharge Medication List as of 06/06/2016 11:07 PM    START taking these medications   Details  ciprofloxacin (CIPRO) 500 MG tablet Take 1 tablet (500 mg total) by mouth 2 (two) times daily., Starting Sat 06/06/2016, Print    phenazopyridine (PYRIDIUM) 95 MG tablet Take 1 tablet (95 mg total) by mouth 3 (three) times daily as needed for pain., Starting Sat 06/06/2016, Print            This chart was dictated using voice recognition software/Dragon. Despite best efforts to proofread,  errors can occur which can change the meaning. Any change was purely unintentional.    Darletta Moll, PA-C 06/07/16 [id number]    Hinda Kehr, MD 06/07/16 [id number]

## 2016-06-30 ENCOUNTER — Other Ambulatory Visit: Payer: Self-pay | Admitting: Internal Medicine

## 2016-06-30 NOTE — Telephone Encounter (Signed)
Last filled 04/29/2016--please advise

## 2016-07-01 NOTE — Telephone Encounter (Signed)
Ok to phone in Ultram 

## 2016-07-01 NOTE — Telephone Encounter (Signed)
Rx called in to pharmacy. 

## 2016-07-29 ENCOUNTER — Encounter: Payer: Self-pay | Admitting: Family Medicine

## 2016-07-29 ENCOUNTER — Ambulatory Visit (INDEPENDENT_AMBULATORY_CARE_PROVIDER_SITE_OTHER): Payer: 59 | Admitting: Family Medicine

## 2016-07-29 ENCOUNTER — Other Ambulatory Visit (HOSPITAL_COMMUNITY)
Admission: RE | Admit: 2016-07-29 | Discharge: 2016-07-29 | Disposition: A | Discharge status: Home / Self Care | Payer: 59 | Source: Ambulatory Visit | Attending: Internal Medicine | Admitting: Internal Medicine

## 2016-07-29 ENCOUNTER — Ambulatory Visit: Payer: Self-pay | Admitting: Family Medicine

## 2016-07-29 ENCOUNTER — Ambulatory Visit: Payer: Self-pay | Admitting: Internal Medicine

## 2016-07-29 VITALS — BP 120/72 | HR 103

## 2016-07-29 DIAGNOSIS — N898 Other specified noninflammatory disorders of vagina: Secondary | ICD-10-CM

## 2016-07-29 DIAGNOSIS — R102 Pelvic and perineal pain: Secondary | ICD-10-CM | POA: Diagnosis not present

## 2016-07-29 DIAGNOSIS — Z01419 Encounter for gynecological examination (general) (routine) without abnormal findings: Secondary | ICD-10-CM | POA: Insufficient documentation

## 2016-07-29 DIAGNOSIS — A599 Trichomoniasis, unspecified: Secondary | ICD-10-CM | POA: Diagnosis not present

## 2016-07-29 DIAGNOSIS — Z124 Encounter for screening for malignant neoplasm of cervix: Secondary | ICD-10-CM

## 2016-07-29 LAB — POC URINALSYSI DIPSTICK (AUTOMATED)
Leukocytes, UA: NEGATIVE
PH UA: 6
Spec Grav, UA: >=1.03
Urobilinogen, UA: NEGATIVE

## 2016-07-29 NOTE — Progress Notes (Signed)
Subjective:    Patient ID: Valerie Andrews, female    DOB: 1966/05/15, 50 y.o.   MRN: [id number]  HPI This is a 50 yo female who presents today with 2 days of pain in her vagina, discharge that is milky, smells like ? Fish. Has used some new soap recently. Feels mildly nauseous. Period was normal for her, finished a couple of days ago.  Periods have become more irregular. Not recently sexually active. Does not know when last PAP was. Does not wish to be screened for STDs today.  No dysuria, no hematuria, no frequency, no fever/chills, no abdominal pain, no nausea/vomiting.   Works as a Quarry manager.   Past Medical History:  Diagnosis Date  . Arthritis   . Chicken pox   . GERD (gastroesophageal reflux disease)    No past surgical history on file. Family History  Problem Relation Age of Onset  . Hypertension Mother   . Stroke Father   . Diabetes Paternal Grandmother   . Cancer Neg Hx   . Breast cancer Neg Hx    Social History  Substance Use Topics  . Smoking status: Never Smoker  . Smokeless tobacco: Never Used  . Alcohol use No      Review of Systems Per HPI    Objective:   Physical Exam  Constitutional: She is oriented to person, place, and time. She appears well-developed and well-nourished. No distress.  Obese.   HENT:  Head: Normocephalic and atraumatic.  Eyes: Conjunctivae are normal.  Neck: Normal range of motion. Neck supple.  Cardiovascular: Normal rate, regular rhythm and normal heart sounds.   Pulmonary/Chest: Effort normal and breath sounds normal.  Abdominal: Soft. Bowel sounds are normal. She exhibits no distension. There is tenderness (suprapubic). There is no rebound and no guarding.  Obese abdomen.    Genitourinary: Vagina normal. Pelvic exam was performed with patient supine. No labial fusion. There is no rash, tenderness, lesion or injury on the right labia. There is no rash, tenderness, lesion or injury on the left labia. Cervix exhibits discharge (small  amount, thin, white). Cervix exhibits no motion tenderness and no friability.  Genitourinary Comments: Bimanual exam performed, but difficult to assess structures due to patient size. No tenderness with palpation.   Neurological: She is alert and oriented to person, place, and time.  Skin: Skin is warm and dry. She is not diaphoretic.  Psychiatric: She has a normal mood and affect. Her behavior is normal. Judgment and thought content normal.  Vitals reviewed.    BP 120/72   Pulse (!) 103   Wt (P) 267 lb (121.1 kg)   SpO2 97%   BMI (P) 41.82 kg/m  Wt Readings from Last 3 Encounters:  07/29/16 (P) 267 lb (121.1 kg)  06/06/16 250 lb (113.4 kg)  02/13/16 270 lb (122.5 kg)   Results for orders placed or performed in visit on 07/29/16  POCT Urinalysis Dipstick (Automated)  Result Value Ref Range   Color, UA amber    Clarity, UA cloudy    Glucose, UA -    Bilirubin, UA 1+    Ketones, UA -    Spec Grav, UA >=1.030    Blood, UA +-    pH, UA 6.0    Protein, UA +-    Urobilinogen, UA negative    Nitrite, UA -    Leukocytes, UA Negative Negative        Assessment & Plan:  1. Vaginal pain - POCT Urinalysis Dipstick (Automated) -  WET PREP BY MOLECULAR PROBE  2. Vaginal discharge - WET PREP BY MOLECULAR PROBE- will contact patient with results and determine plan of care  3. Pap smear for cervical cancer screening - Cytology - PAP [place], FNP-BC  [facility], [place]  07/30/2016 12:02 PM

## 2016-07-29 NOTE — Patient Instructions (Signed)
I will contact you through Mychart to let you know your results  Perimenopause Perimenopause is the time when your body begins to move into the menopause (no menstrual period for 12 straight months). It is a natural process. Perimenopause can begin 2-8 years before the menopause and usually lasts for 1 year after the menopause. During this time, your ovaries may or may not produce an egg. The ovaries vary in their production of estrogen and progesterone hormones each month. This can cause irregular menstrual periods, difficulty getting pregnant, vaginal bleeding between periods, and uncomfortable symptoms. CAUSES  Irregular production of the ovarian hormones, estrogen and progesterone, and not ovulating every month.  Other causes include:  Tumor of the pituitary gland in the brain.  Medical disease that affects the ovaries.  Radiation treatment.  Chemotherapy.  Unknown causes.  Heavy smoking and excessive alcohol intake can bring on perimenopause sooner. SIGNS AND SYMPTOMS   Hot flashes.  Night sweats.  Irregular menstrual periods.  Decreased sex drive.  Vaginal dryness.  Headaches.  Mood swings.  Depression.  Memory problems.  Irritability.  Tiredness.  Weight gain.  Trouble getting pregnant.  The beginning of losing bone cells (osteoporosis).  The beginning of hardening of the arteries (atherosclerosis). DIAGNOSIS  Your health care provider will make a diagnosis by analyzing your age, menstrual history, and symptoms. He or she will do a physical exam and note any changes in your body, especially your female organs. Female hormone tests may or may not be helpful depending on the amount of female hormones you produce and when you produce them. However, other hormone tests may be helpful to rule out other problems. TREATMENT  In some cases, no treatment is needed. The decision on whether treatment is necessary during the perimenopause should be made by you and  your health care provider based on how the symptoms are affecting you and your lifestyle. Various treatments are available, such as:  Treating individual symptoms with a specific medicine for that symptom.  Herbal medicines that can help specific symptoms.  Counseling.  Group therapy. HOME CARE INSTRUCTIONS   Keep track of your menstrual periods (when they occur, how heavy they are, how long between periods, and how long they last) as well as your symptoms and when they started.  Only take over-the-counter or prescription medicines as directed by your health care provider.  Sleep and rest.  Exercise.  Eat a diet that contains calcium (good for your bones) and soy (acts like the estrogen hormone).  Do not smoke.  Avoid alcoholic beverages.  Take vitamin supplements as recommended by your health care provider. Taking vitamin E may help in certain cases.  Take calcium and vitamin D supplements to help prevent bone loss.  Group therapy is sometimes helpful.  Acupuncture may help in some cases. SEEK MEDICAL CARE IF:   You have questions about any symptoms you are having.  You need a referral to a specialist (gynecologist, psychiatrist, or psychologist). SEEK IMMEDIATE MEDICAL CARE IF:   You have vaginal bleeding.  Your period lasts longer than 8 days.  Your periods are recurring sooner than 21 days.  You have bleeding after intercourse.  You have severe depression.  You have pain when you urinate.  You have severe headaches.  You have vision problems.   This information is not intended to replace advice given to you by your health care provider. Make sure you discuss any questions you have with your health care provider.   Document Released: 12/03/2004  Document Revised: 11/16/2014 Document Reviewed: 05/25/2013 Elsevier Interactive Patient Education [employer].

## 2016-07-30 ENCOUNTER — Encounter: Payer: Self-pay | Admitting: Family Medicine

## 2016-07-30 ENCOUNTER — Other Ambulatory Visit: Payer: Self-pay | Admitting: Internal Medicine

## 2016-07-30 LAB — WET PREP BY MOLECULAR PROBE
Candida species: NEGATIVE
Gardnerella vaginalis: NEGATIVE
Trichomonas vaginosis: POSITIVE — AB

## 2016-07-30 MED ORDER — METRONIDAZOLE 500 MG PO TABS: 2000.0000 mg | ORAL_TABLET | Freq: Once | ORAL | 0 refills | Status: AC

## 2016-07-30 NOTE — Addendum Note (Signed)
Addended by: Clarene Reamer B on: 07/30/2016 12:28 PM   Modules accepted: Orders

## 2016-07-31 NOTE — Telephone Encounter (Signed)
Last filled 06/30/16, pt was last seen by you on 02/13/2016. No future appt's scheduled.

## 2016-08-03 LAB — CYTOLOGY - PAP

## 2016-08-11 ENCOUNTER — Telehealth: Payer: Self-pay | Admitting: Internal Medicine

## 2016-08-11 NOTE — Telephone Encounter (Signed)
Pt called regarding labs, she states she has not gotten the results or been on mychart.  NP Tor Netters saw her for her last visit, and the lab results are in there. Can you call her back about this? I dont know who it would go to Thanks  Pt call back number is [phone] Thanks

## 2016-08-12 NOTE — Telephone Encounter (Signed)
Left message on voicemail.

## 2016-08-26 ENCOUNTER — Other Ambulatory Visit: Payer: Self-pay | Admitting: Internal Medicine

## 2016-08-26 NOTE — Telephone Encounter (Signed)
Last filled 07/01/16--please advise

## 2016-08-26 NOTE — Telephone Encounter (Signed)
Ok to phone in Tramadol 

## 2016-08-26 NOTE — Telephone Encounter (Signed)
Rx called in to pharmacy. 

## 2016-10-08 ENCOUNTER — Other Ambulatory Visit: Payer: Self-pay | Admitting: Internal Medicine

## 2016-10-09 NOTE — Telephone Encounter (Signed)
Last filled 08/26/16--please advise

## 2016-10-10 NOTE — Telephone Encounter (Signed)
Ok to phone in Tramadol 

## 2016-10-10 NOTE — Telephone Encounter (Signed)
Rx called in to pharmacy. 

## 2016-10-15 ENCOUNTER — Encounter: Payer: Self-pay | Admitting: Emergency Medicine

## 2016-10-15 ENCOUNTER — Emergency Department
Admission: EM | Admit: 2016-10-15 | Discharge: 2016-10-15 | Disposition: A | Discharge status: Home / Self Care | Payer: 59 | Attending: Student in an Organized Health Care Education/Training Program | Admitting: Student in an Organized Health Care Education/Training Program

## 2016-10-15 DIAGNOSIS — G44319 Acute post-traumatic headache, not intractable: Secondary | ICD-10-CM | POA: Insufficient documentation

## 2016-10-15 DIAGNOSIS — Y92410 Unspecified street and highway as the place of occurrence of the external cause: Secondary | ICD-10-CM | POA: Insufficient documentation

## 2016-10-15 DIAGNOSIS — S0990XA Unspecified injury of head, initial encounter: Secondary | ICD-10-CM | POA: Diagnosis present

## 2016-10-15 DIAGNOSIS — G44309 Post-traumatic headache, unspecified, not intractable: Secondary | ICD-10-CM | POA: Diagnosis not present

## 2016-10-15 DIAGNOSIS — Y939 Activity, unspecified: Secondary | ICD-10-CM | POA: Insufficient documentation

## 2016-10-15 DIAGNOSIS — Y999 Unspecified external cause status: Secondary | ICD-10-CM | POA: Diagnosis not present

## 2016-10-15 MED ORDER — NAPROXEN 500 MG PO TABS: 500.0000 mg | ORAL_TABLET | Freq: Two times a day (BID) | ORAL | 0 refills | Status: DC

## 2016-10-15 MED ORDER — CYCLOBENZAPRINE HCL 10 MG PO TABS: 10.0000 mg | ORAL_TABLET | Freq: Three times a day (TID) | ORAL | 0 refills | Status: DC | PRN

## 2016-10-15 NOTE — ED Notes (Signed)
AAOx3.  Skin warm and dry.  Ambulates with easy and steady gait. NAD 
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
## 2016-12-09 ENCOUNTER — Other Ambulatory Visit: Payer: Self-pay | Admitting: Internal Medicine

## 2016-12-09 NOTE — Telephone Encounter (Signed)
Rx called in to pharmacy. 

## 2016-12-09 NOTE — Telephone Encounter (Signed)
Last filled 10/10/2016--please advise 

## 2016-12-09 NOTE — Telephone Encounter (Signed)
Ok to phone in Tramadol 

## 2016-12-10 ENCOUNTER — Ambulatory Visit: Payer: Self-pay | Admitting: Internal Medicine

## 2017-01-06 ENCOUNTER — Ambulatory Visit: Payer: Self-pay | Admitting: Internal Medicine

## 2017-01-14 ENCOUNTER — Other Ambulatory Visit: Payer: Self-pay | Admitting: Internal Medicine

## 2017-01-14 NOTE — Telephone Encounter (Signed)
Ok to phone in Tramadol. She needs to make an appt for an annual exam.

## 2017-01-14 NOTE — Telephone Encounter (Signed)
Last filled 12/09/2016--please advise

## 2017-01-15 NOTE — Telephone Encounter (Signed)
Rx called in to pharmacy. 

## 2017-01-20 NOTE — Telephone Encounter (Signed)
Left message on voicemail.

## 2017-01-21 NOTE — Telephone Encounter (Signed)
Letter mailed to pt letting her know she is overdue for a CPE

## 2017-01-27 ENCOUNTER — Encounter: Payer: Self-pay | Admitting: Family Medicine

## 2017-01-27 ENCOUNTER — Ambulatory Visit (INDEPENDENT_AMBULATORY_CARE_PROVIDER_SITE_OTHER): Payer: 59 | Admitting: Family Medicine

## 2017-01-27 VITALS — BP 114/78 | HR 77 | Temp 98.4°F | Wt 274.5 lb

## 2017-01-27 DIAGNOSIS — R21 Rash and other nonspecific skin eruption: Secondary | ICD-10-CM

## 2017-01-27 MED ORDER — TRIAMCINOLONE ACETONIDE 0.1 % EX CREA: 1.0000 "application " | TOPICAL_CREAM | Freq: Two times a day (BID) | CUTANEOUS | 0 refills | Status: DC

## 2017-01-27 NOTE — Progress Notes (Signed)
Pre visit review using our clinic review tool, if applicable. No additional management support is needed unless otherwise documented below in the visit note. 

## 2017-01-27 NOTE — Patient Instructions (Signed)
It was a pleasure to see you today.  I think you have a skin irritation, I have sent a steroid cream to your pharmacy Please let me know if your rash gets worse

## 2017-01-27 NOTE — Progress Notes (Signed)
   Subjective:    Patient ID: Valerie Andrews, female    DOB: 1966/03/20, 51 y.o.   MRN: [id number]  HPI This is a 51 yo female who presents today with painless, non itchy rash of right thigh x 2 days. Concerned it is shingles; her aunt has shingles. No fever/chills, no myalgias. No, tingling or discomfort at site. Did spray some perfume on area prior to rash starting. No new soaps, lotions, detergents.   Past Medical History:  Diagnosis Date  . Arthritis   . Chicken pox   . GERD (gastroesophageal reflux disease)    No past surgical history on file. Family History  Problem Relation Age of Onset  . Hypertension Mother   . Stroke Father   . Diabetes Paternal Grandmother   . Cancer Neg Hx   . Breast cancer Neg Hx    Social History  Substance Use Topics  . Smoking status: Never Smoker  . Smokeless tobacco: Never Used  . Alcohol use No      Review of Systems  Constitutional: Negative for fever.  Musculoskeletal: Negative for myalgias.  Skin: Positive for rash (right thigh).       Objective:   Physical Exam  Constitutional: She is oriented to person, place, and time. She appears well-developed and well-nourished.  HENT:  Head: Normocephalic and atraumatic.  Eyes: Conjunctivae are normal.  Cardiovascular: Normal rate.   Pulmonary/Chest: Effort normal.  Neurological: She is alert and oriented to person, place, and time.  Skin: Skin is warm and dry. Rash (Right outter thigh with approxiamtely 4 cm area of raised, dry skin. No erythem, no vessicles, no draiange. Nontender. ) noted.  Psychiatric: She has a normal mood and affect. Her behavior is normal. Judgment and thought content normal.  Vitals reviewed.     BP 114/78   Pulse 77   Temp 98.4 F (36.9 C) (Oral)   Wt 274 lb 8 oz (124.5 kg)   LMP 01/09/2017   SpO2 99%   BMI 42.99 kg/m  Wt Readings from Last 3 Encounters:  01/27/17 274 lb 8 oz (124.5 kg)  10/15/16 200 lb (90.7 kg)  07/29/16 (P) 267 lb (121.1 kg)        Assessment & Plan:  1. Rash - likely irritated from perfume - Provided written and verbal information regarding diagnosis and treatment. - RTC precautions reviewed - triamcinolone cream (KENALOG) 0.1 %; Apply 1 application topically 2 (two) times daily. For maximum 7 days.  Dispense: 15 g; Refill: 0   Clarene Reamer, FNP-BC   [facility] at [facility] [place] [facility]  01/27/2017 9:39 AM

## 2017-02-22 ENCOUNTER — Encounter: Payer: Self-pay | Admitting: Internal Medicine

## 2017-02-22 ENCOUNTER — Ambulatory Visit (INDEPENDENT_AMBULATORY_CARE_PROVIDER_SITE_OTHER): Payer: 59 | Admitting: Internal Medicine

## 2017-02-22 VITALS — BP 118/76 | HR 66 | Temp 97.9°F | Ht 65.75 in | Wt 281.0 lb

## 2017-02-22 DIAGNOSIS — K219 Gastro-esophageal reflux disease without esophagitis: Secondary | ICD-10-CM | POA: Diagnosis not present

## 2017-02-22 DIAGNOSIS — H6123 Impacted cerumen, bilateral: Secondary | ICD-10-CM | POA: Diagnosis not present

## 2017-02-22 DIAGNOSIS — M170 Bilateral primary osteoarthritis of knee: Secondary | ICD-10-CM | POA: Diagnosis not present

## 2017-02-22 DIAGNOSIS — Z0001 Encounter for general adult medical examination with abnormal findings: Secondary | ICD-10-CM | POA: Diagnosis not present

## 2017-02-22 DIAGNOSIS — Z1231 Encounter for screening mammogram for malignant neoplasm of breast: Secondary | ICD-10-CM | POA: Diagnosis not present

## 2017-02-22 DIAGNOSIS — Z1239 Encounter for other screening for malignant neoplasm of breast: Secondary | ICD-10-CM

## 2017-02-22 LAB — LIPID PANEL
CHOL/HDL RATIO: 3
Cholesterol: 162 mg/dL (ref 0–200)
HDL: 50.8 mg/dL (ref >39.00)
LDL Cholesterol: 96 mg/dL (ref 0–99)
NONHDL: 111.22
Triglycerides: 74 mg/dL (ref 0.0–149.0)
VLDL: 14.8 mg/dL (ref 0.0–40.0)

## 2017-02-22 LAB — COMPREHENSIVE METABOLIC PANEL
ALBUMIN: 3.8 g/dL (ref 3.5–5.2)
ALK PHOS: 69 U/L (ref 39–117)
ALT: 15 U/L (ref 0–35)
AST: 13 U/L (ref 0–37)
BUN: 19 mg/dL (ref 6–23)
CO2: 28 mEq/L (ref 19–32)
CREATININE: 0.91 mg/dL (ref 0.40–1.20)
Calcium: 9.1 mg/dL (ref 8.4–10.5)
Chloride: 103 mEq/L (ref 96–112)
GFR: 84.55 mL/min (ref >60.00)
GLUCOSE: 92 mg/dL (ref 70–99)
Potassium: 4.1 mEq/L (ref 3.5–5.1)
SODIUM: 136 meq/L (ref 135–145)
TOTAL PROTEIN: 6.7 g/dL (ref 6.0–8.3)
Total Bilirubin: 1.2 mg/dL (ref 0.2–1.2)

## 2017-02-22 LAB — CBC
HCT: 43.7 % (ref 36.0–46.0)
Hemoglobin: 14 g/dL (ref 12.0–15.0)
MCHC: 32 g/dL (ref 30.0–36.0)
MCV: 71.5 fl — ABNORMAL LOW (ref 78.0–100.0)
Platelets: 171 10*3/uL (ref 150.0–400.0)
RBC: 6.11 Mil/uL — AB (ref 3.87–5.11)
RDW: 14.6 % (ref 11.5–15.5)
WBC: 5.9 10*3/uL (ref 4.0–10.5)

## 2017-02-22 LAB — TSH: TSH: 2.92 u[IU]/mL (ref 0.35–4.50)

## 2017-02-22 MED ORDER — METHOCARBAMOL 500 MG PO TABS: 500.0000 mg | ORAL_TABLET | Freq: Every day | ORAL | 0 refills | Status: DC | PRN

## 2017-02-22 NOTE — Assessment & Plan Note (Signed)
Encouraged her to consume a 1500 calorie restricted diet daily Advised her to try to get some exercise outside of work, starting with just 1-2 days per week for 20-30 minutes

## 2017-02-22 NOTE — Progress Notes (Signed)
Subjective:    Patient ID: Valerie Andrews, female    DOB: 1965-12-28, 51 y.o.   MRN: [id number]  HPI  Pt presents to the clinic today for her annual exam. She is also due for follow up of chronic conditions.  GERD: Triggered by spicy foods. She takes Tums as needed with good relief.   Arthritis of Knees: She reports the Meloxicam is causing her to have hearing problems but it helps a lot with her knees. She is requesting a RX for a muscle relaxer as well.  Morbid Obesity: Her weight today is 281 lbs with a BMI of 45.7. She has gained 11 lbs over the last year. She does not adhere to any diet or exercise regimen.  Flu: 08/2016 Tetanus: 09/2008 Pap Smear: 07/2016 Mammogram: 08/2015 Vision Screening: as needed Dentist: as needed  Diet: She does eat meat. She does not consume a lot of fruits and veggies. She eats a lot of fried foods. She drinks mostly juice, some soda, some water. Exercise: None  Review of Systems      Past Medical History:  Diagnosis Date  . Arthritis   . Chicken pox   . GERD (gastroesophageal reflux disease)     Current Outpatient Prescriptions  Medication Sig Dispense Refill  . Acetaminophen 500 MG coapsule Take 500 mg by mouth every 4 (four) hours as needed for pain. Reported on 03/25/2016    . meloxicam (MOBIC) 15 MG tablet TAKE 1 TABLET BY MOUTH DAILY. 30 tablet 6  . traMADol (ULTRAM) 50 MG tablet TAKE 1 TABLET BY MOUTH EVERY 12 HOURS AS NEEDED 60 tablet 0  . triamcinolone cream (KENALOG) 0.1 % Apply 1 application topically 2 (two) times daily. For maximum 7 days. 15 g 0   No current facility-administered medications for this visit.     No Known Allergies  Family History  Problem Relation Age of Onset  . Hypertension Mother   . Stroke Father   . Diabetes Paternal Grandmother   . Cancer Neg Hx   . Breast cancer Neg Hx     Social History   Social History  . Marital status: Single    Spouse name: N/A  . Number of children: N/A  . Years of  education: N/A   Occupational History  . Not on file.   Social History Main Topics  . Smoking status: Never Smoker  . Smokeless tobacco: Never Used  . Alcohol use No  . Drug use: No  . Sexual activity: Not Currently   Other Topics Concern  . Not on file   Social History Narrative  . No narrative on file     Constitutional: Pt reports weight gain. Denies fever, malaise, fatigue, headache.  HEENT: Pt reports decreased hearing. Denies eye pain, eye redness, ear pain, ringing in the ears, wax buildup, runny nose, nasal congestion, bloody nose, or sore throat. Respiratory: Denies difficulty breathing, shortness of breath, cough or sputum production.   Cardiovascular: Denies chest pain, chest tightness, palpitations or swelling in the hands or feet.  Gastrointestinal: Denies abdominal pain, bloating, constipation, diarrhea or blood in the stool.  GU: Denies urgency, frequency, pain with urination, burning sensation, blood in urine, odor or discharge. Musculoskeletal: Pt reports bilateral knee pain. Denies decrease in range of motion, difficulty with gait, muscle pain or joint swelling.  Skin: Denies redness, rashes, lesions or ulcercations.  Neurological: Denies dizziness, difficulty with memory, difficulty with speech or problems with balance and coordination.  Psych: Denies anxiety, depression, SI/HI.  No other specific complaints in a complete review of systems (except as listed in HPI above).  Objective:   Physical Exam   BP 118/76   Pulse 66   Temp 97.9 F (36.6 C) (Oral)   Ht 5' 5.75" (1.67 m)   Wt 281 lb (127.5 kg)   LMP 02/07/2017   SpO2 99%   BMI 45.70 kg/m  Wt Readings from Last 3 Encounters:  02/22/17 281 lb (127.5 kg)  01/27/17 274 lb 8 oz (124.5 kg)  10/15/16 200 lb (90.7 kg)    General: Appears her stated age, obese in NAD. Skin: Warm, dry and intact.  HEENT: Head: normal shape and size; Eyes: sclera white, no icterus, conjunctiva pink, PERRLA and EOMs  intact; Ears: Bilateral cerumen impaction; Throat/Mouth: Teeth present, mucosa pink and moist, no exudate, lesions or ulcerations noted.  Neck:  Neck supple, trachea midline. No masses, lumps or thyromegaly present.  Cardiovascular: Normal rate and rhythm. S1,S2 noted.  No murmur, rubs or gallops noted. No JVD or BLE edema. No carotid bruits noted. Pulmonary/Chest: Normal effort and positive vesicular breath sounds. No respiratory distress. No wheezes, rales or ronchi noted.  Abdomen: Soft and nontender. Normal bowel sounds. No distention or masses noted. Liver, spleen and kidneys non palpable due to size. Musculoskeletal: Strength 5/5 BUE/BLE. No difficulty with gait.  Neurological: Alert and oriented. Cranial nerves II-XII grossly intact. Coordination normal.  Psychiatric: Mood and affect normal. Behavior is normal. Judgment and thought content normal.     BMET    Component Value Date/Time   NA 138 07/04/2015 1450   K 4.0 07/04/2015 1450   CL 105 07/04/2015 1450   CO2 27 07/04/2015 1450   GLUCOSE 88 07/04/2015 1450   BUN 19 07/04/2015 1450   CREATININE 0.97 07/04/2015 1450   CALCIUM 8.7 07/04/2015 1450   GFRNONAA >60 04/17/2015 0937   GFRAA >60 04/17/2015 0937    Lipid Panel     Component Value Date/Time   CHOL 157 07/04/2015 1450   TRIG 96.0 07/04/2015 1450   HDL 43.90 07/04/2015 1450   CHOLHDL 4 07/04/2015 1450   VLDL 19.2 07/04/2015 1450   LDLCALC 94 07/04/2015 1450    CBC    Component Value Date/Time   WBC 5.7 07/04/2015 1450   RBC 5.92 (H) 07/04/2015 1450   HGB 13.6 07/04/2015 1450   HCT 43.2 07/04/2015 1450   PLT 203.0 07/04/2015 1450   MCV 73.1 (L) 07/04/2015 1450   MCH 23.0 (L) 04/17/2015 0937   MCHC 31.4 07/04/2015 1450   RDW 15.3 07/04/2015 1450   LYMPHSABS 2.2 04/17/2015 0937   MONOABS 0.3 04/17/2015 0937   EOSABS 0.1 04/17/2015 0937   BASOSABS 0.0 04/17/2015 0937    Hgb A1C Lab Results  Component Value Date   HGBA1C 5.3 07/04/2015            Assessment & Plan:   Preventative Health Maintenance:  Flu and tetanus UTD Pap smear UTD Mammogram ordered, she will call [place] to schedule- number provided Encouraged her to consume a balanced diet and exercise regimen Advised her to see an eye doctor and dentist annually Will check CBC, CMET, Lipid, TSH and A1C today  Bilateral Hearing Loss due to Cerumen Impaction:  Manual lavage by CMA Advised her to try Debrox 2 x week to prevent wax buildup  RTC in 1 year, sooner if needed Webb Silversmith, NP

## 2017-02-22 NOTE — Patient Instructions (Signed)
Health Maintenance, Female Adopting a healthy lifestyle and getting preventive care can go a long way to promote health and wellness. Talk with your health care provider about what schedule of regular examinations is right for you. This is a good chance for you to check in with your provider about disease prevention and staying healthy. In between checkups, there are plenty of things you can do on your own. Experts have done a lot of research about which lifestyle changes and preventive measures are most likely to keep you healthy. Ask your health care provider for more information. Weight and diet Eat a healthy diet  Be sure to include plenty of vegetables, fruits, low-fat dairy products, and lean protein.  Do not eat a lot of foods high in solid fats, added sugars, or salt.  Get regular exercise. This is one of the most important things you can do for your health.  Most adults should exercise for at least 150 minutes each week. The exercise should increase your heart rate and make you sweat (moderate-intensity exercise).  Most adults should also do strengthening exercises at least twice a week. This is in addition to the moderate-intensity exercise. Maintain a healthy weight  Body mass index (BMI) is a measurement that can be used to identify possible weight problems. It estimates body fat based on height and weight. Your health care provider can help determine your BMI and help you achieve or maintain a healthy weight.  For females 76 years of age and older:  A BMI below 18.5 is considered underweight.  A BMI of 18.5 to 24.9 is normal.  A BMI of 25 to 29.9 is considered overweight.  A BMI of 30 and above is considered obese. Watch levels of cholesterol and blood lipids  You should start having your blood tested for lipids and cholesterol at 49 years of age, then have this test every 5 years.  You may need to have your cholesterol levels checked more often if:  Your lipid or  cholesterol levels are high.  You are older than 49 years of age.  You are at high risk for heart disease. Cancer screening Lung Cancer  Lung cancer screening is recommended for adults 64-42 years old who are at high risk for lung cancer because of a history of smoking.  A yearly low-dose CT scan of the lungs is recommended for people who:  Currently smoke.  Have quit within the past 15 years.  Have at least a 30-pack-year history of smoking. A pack year is smoking an average of one pack of cigarettes a day for 1 year.  Yearly screening should continue until it has been 15 years since you quit.  Yearly screening should stop if you develop a health problem that would prevent you from having lung cancer treatment. Breast Cancer  Practice breast self-awareness. This means understanding how your breasts normally appear and feel.  It also means doing regular breast self-exams. Let your health care provider know about any changes, no matter how small.  If you are in your 20s or 30s, you should have a clinical breast exam (CBE) by a health care provider every 1-3 years as part of a regular health exam.  If you are 34 or older, have a CBE every year. Also consider having a breast X-ray (mammogram) every year.  If you have a family history of breast cancer, talk to your health care provider about genetic screening.  If you are at high risk for breast cancer, talk  to your health care provider about having an MRI and a mammogram every year.  Breast cancer gene (BRCA) assessment is recommended for women who have family members with BRCA-related cancers. BRCA-related cancers include:  Breast.  Ovarian.  Tubal.  Peritoneal cancers.  Results of the assessment will determine the need for genetic counseling and BRCA1 and BRCA2 testing. Cervical Cancer  Your health care provider may recommend that you be screened regularly for cancer of the pelvic organs (ovaries, uterus, and vagina).  This screening involves a pelvic examination, including checking for microscopic changes to the surface of your cervix (Pap test). You may be encouraged to have this screening done every 3 years, beginning at age 24.  For women ages 66-65, health care providers may recommend pelvic exams and Pap testing every 3 years, or they may recommend the Pap and pelvic exam, combined with testing for human papilloma virus (HPV), every 5 years. Some types of HPV increase your risk of cervical cancer. Testing for HPV may also be done on women of any age with unclear Pap test results.  Other health care providers may not recommend any screening for nonpregnant women who are considered low risk for pelvic cancer and who do not have symptoms. Ask your health care provider if a screening pelvic exam is right for you.  If you have had past treatment for cervical cancer or a condition that could lead to cancer, you need Pap tests and screening for cancer for at least 20 years after your treatment. If Pap tests have been discontinued, your risk factors (such as having a new sexual partner) need to be reassessed to determine if screening should resume. Some women have medical problems that increase the chance of getting cervical cancer. In these cases, your health care provider may recommend more frequent screening and Pap tests. Colorectal Cancer  This type of cancer can be detected and often prevented.  Routine colorectal cancer screening usually begins at 49 years of age and continues through 49 years of age.  Your health care provider may recommend screening at an earlier age if you have risk factors for colon cancer.  Your health care provider may also recommend using home test kits to check for hidden blood in the stool.  A small camera at the end of a tube can be used to examine your colon directly (sigmoidoscopy or colonoscopy). This is done to check for the earliest forms of colorectal cancer.  Routine  screening usually begins at age 41.  Direct examination of the colon should be repeated every 5-10 years through 49 years of age. However, you may need to be screened more often if early forms of precancerous polyps or small growths are found. Skin Cancer  Check your skin from head to toe regularly.  Tell your health care provider about any new moles or changes in moles, especially if there is a change in a mole's shape or color.  Also tell your health care provider if you have a mole that is larger than the size of a pencil eraser.  Always use sunscreen. Apply sunscreen liberally and repeatedly throughout the day.  Protect yourself by wearing long sleeves, pants, a wide-brimmed hat, and sunglasses whenever you are outside. Heart disease, diabetes, and high blood pressure  High blood pressure causes heart disease and increases the risk of stroke. High blood pressure is more likely to develop in:  People who have blood pressure in the high end of the normal range (130-139/85-89 mm Hg).  People who are overweight or obese.  People who are African American.  If you are 59-24 years of age, have your blood pressure checked every 3-5 years. If you are 34 years of age or older, have your blood pressure checked every year. You should have your blood pressure measured twice-once when you are at a hospital or clinic, and once when you are not at a hospital or clinic. Record the average of the two measurements. To check your blood pressure when you are not at a hospital or clinic, you can use:  An automated blood pressure machine at a pharmacy.  A home blood pressure monitor.  If you are between 29 years and 60 years old, ask your health care provider if you should take aspirin to prevent strokes.  Have regular diabetes screenings. This involves taking a blood sample to check your fasting blood sugar level.  If you are at a normal weight and have a low risk for diabetes, have this test once  every three years after 49 years of age.  If you are overweight and have a high risk for diabetes, consider being tested at a younger age or more often. Preventing infection Hepatitis B  If you have a higher risk for hepatitis B, you should be screened for this virus. You are considered at high risk for hepatitis B if:  You were born in a country where hepatitis B is common. Ask your health care provider which countries are considered high risk.  Your parents were born in a high-risk country, and you have not been immunized against hepatitis B (hepatitis B vaccine).  You have HIV or AIDS.  You use needles to inject street drugs.  You live with someone who has hepatitis B.  You have had sex with someone who has hepatitis B.  You get hemodialysis treatment.  You take certain medicines for conditions, including cancer, organ transplantation, and autoimmune conditions. Hepatitis C  Blood testing is recommended for:  Everyone born from [birth year] through 1963.  Anyone with known risk factors for hepatitis C. Sexually transmitted infections (STIs)  You should be screened for sexually transmitted infections (STIs) including gonorrhea and chlamydia if:  You are sexually active and are younger than 49 years of age.  You are older than 49 years of age and your health care provider tells you that you are at risk for this type of infection.  Your sexual activity has changed since you were last screened and you are at an increased risk for chlamydia or gonorrhea. Ask your health care provider if you are at risk.  If you do not have HIV, but are at risk, it may be recommended that you take a prescription medicine daily to prevent HIV infection. This is called pre-exposure prophylaxis (PrEP). You are considered at risk if:  You are sexually active and do not regularly use condoms or know the HIV status of your partner(s).  You take drugs by injection.  You are sexually active with a partner  who has HIV. Talk with your health care provider about whether you are at high risk of being infected with HIV. If you choose to begin PrEP, you should first be tested for HIV. You should then be tested every 3 months for as long as you are taking PrEP. Pregnancy  If you are premenopausal and you may become pregnant, ask your health care provider about preconception counseling.  If you may become pregnant, take 400 to 800 micrograms (mcg) of folic acid  every day.  If you want to prevent pregnancy, talk to your health care provider about birth control (contraception). Osteoporosis and menopause  Osteoporosis is a disease in which the bones lose minerals and strength with aging. This can result in serious bone fractures. Your risk for osteoporosis can be identified using a bone density scan.  If you are 4 years of age or older, or if you are at risk for osteoporosis and fractures, ask your health care provider if you should be screened.  Ask your health care provider whether you should take a calcium or vitamin D supplement to lower your risk for osteoporosis.  Menopause may have certain physical symptoms and risks.  Hormone replacement therapy may reduce some of these symptoms and risks. Talk to your health care provider about whether hormone replacement therapy is right for you. Follow these instructions at home:  Schedule regular health, dental, and eye exams.  Stay current with your immunizations.  Do not use any tobacco products including cigarettes, chewing tobacco, or electronic cigarettes.  If you are pregnant, do not drink alcohol.  If you are breastfeeding, limit how much and how often you drink alcohol.  Limit alcohol intake to no more than 1 drink per day for nonpregnant women. One drink equals 12 ounces of beer, 5 ounces of wine, or 1 ounces of hard liquor.  Do not use street drugs.  Do not share needles.  Ask your health care provider for help if you need support  or information about quitting drugs.  Tell your health care provider if you often feel depressed.  Tell your health care provider if you have ever been abused or do not feel safe at home. This information is not intended to replace advice given to you by your health care provider. Make sure you discuss any questions you have with your health care provider. Document Released: 05/11/2011 Document Revised: 04/02/2016 Document Reviewed: 07/30/2015 Elsevier Interactive Patient Education  2017 [employer].

## 2017-02-22 NOTE — Assessment & Plan Note (Signed)
Continue Meloxicam eRx for Robaxin 500 mg daily prn #20 Encouraged weight loss

## 2017-02-22 NOTE — Assessment & Plan Note (Signed)
Encouraged her to avoid spicy foods Discussed how weight loss may help improve her reflux

## 2017-02-23 LAB — HEMOGLOBIN A1C: HEMOGLOBIN A1C: 5.4 % (ref 4.6–6.5)

## 2017-02-26 ENCOUNTER — Telehealth: Payer: Self-pay

## 2017-02-26 DIAGNOSIS — M170 Bilateral primary osteoarthritis of knee: Secondary | ICD-10-CM

## 2017-02-26 NOTE — Telephone Encounter (Signed)
Left detailed msg on VM per HIPAA  

## 2017-02-26 NOTE — Telephone Encounter (Signed)
She can try cutting the Robaxin in half to see if that works. She can not take Naproxen while she is taking Meloxicam.

## 2017-02-26 NOTE — Telephone Encounter (Signed)
Pt left v/m; pt seen 02/22/17; pt thinks methocarbamol 500 mg is too strong and giving pt a horrible h/a and pt request naproxen instead. Pt has taken naproxen before.(doesn't think Avie Echevaria NP has prescribed before.) [place].

## 2017-03-01 NOTE — Telephone Encounter (Signed)
Patient received Melanie's message and  is asking for Threasa Beards to call her back at [phone].

## 2017-03-02 MED ORDER — NAPROXEN 500 MG PO TABS: 500.0000 mg | ORAL_TABLET | Freq: Two times a day (BID) | ORAL | 0 refills | Status: DC

## 2017-03-02 NOTE — Telephone Encounter (Signed)
Naproxen sent to pharmacy

## 2017-03-02 NOTE — Addendum Note (Signed)
Addended by: Jearld Fenton on: 03/02/2017 05:55 PM   Modules accepted: Orders

## 2017-03-02 NOTE — Telephone Encounter (Signed)
Pt reports she will just stick with Naproxen... Please send Rx to [facility] outpatient pharmacy

## 2017-03-02 NOTE — Addendum Note (Signed)
Addended by: Lurlean Nanny on: 03/02/2017 05:20 PM   Modules accepted: Orders

## 2017-03-15 ENCOUNTER — Encounter: Payer: Self-pay | Admitting: Internal Medicine

## 2017-03-15 ENCOUNTER — Ambulatory Visit (INDEPENDENT_AMBULATORY_CARE_PROVIDER_SITE_OTHER): Payer: 59 | Admitting: Internal Medicine

## 2017-03-15 VITALS — BP 120/80 | HR 88 | Temp 98.5°F | Wt 283.8 lb

## 2017-03-15 DIAGNOSIS — R252 Cramp and spasm: Secondary | ICD-10-CM

## 2017-03-15 NOTE — Progress Notes (Signed)
Subjective:    Patient ID: Valerie Andrews, female    DOB: [birth year], 51 y.o.   MRN: [id number]  HPI  Pt presents to the clinic today with c/o bilateral leg cramps. She reports this started 1 week ago. It is intermittent. It often seems worse at night. She denies numbness, tingling or weakness. She had labs drawn 3 weeks ago. Potassium was normal. She usually only drinks about 1 cup of water daily. She does not do any stretching. She has not tried anything OTC for this.  Review of Systems      Past Medical History:  Diagnosis Date  . Arthritis   . Chicken pox   . GERD (gastroesophageal reflux disease)     Current Outpatient Prescriptions  Medication Sig Dispense Refill  . Acetaminophen 500 MG coapsule Take 500 mg by mouth every 4 (four) hours as needed for pain. Reported on 03/25/2016    . naproxen (NAPROSYN) 500 MG tablet Take 1 tablet (500 mg total) by mouth 2 (two) times daily with a meal. 30 tablet 0  . traMADol (ULTRAM) 50 MG tablet TAKE 1 TABLET BY MOUTH EVERY 12 HOURS AS NEEDED 60 tablet 0  . triamcinolone cream (KENALOG) 0.1 % Apply 1 application topically 2 (two) times daily. For maximum 7 days. 15 g 0   No current facility-administered medications for this visit.     Allergies  Allergen Reactions  . Methocarbamol Other (See Comments)    headache    Family History  Problem Relation Age of Onset  . Hypertension Mother   . Stroke Father   . Diabetes Paternal Grandmother   . Cancer Neg Hx   . Breast cancer Neg Hx     Social History   Social History  . Marital status: Single    Spouse name: N/A  . Number of children: N/A  . Years of education: N/A   Occupational History  . Not on file.   Social History Main Topics  . Smoking status: Never Smoker  . Smokeless tobacco: Never Used  . Alcohol use No  . Drug use: No  . Sexual activity: Not Currently   Other Topics Concern  . Not on file   Social History Narrative  . No narrative on file      Constitutional: Denies fever, malaise, fatigue, headache or abrupt weight changes.  Musculoskeletal: Pt reports bilateral leg cramps. Denies decrease in range of motion, difficulty with gait, or joint pain and swelling.  Skin: Denies redness, rashes, lesions or ulcercations.  Neurological: Denies problems with balance and coordination.    No other specific complaints in a complete review of systems (except as listed in HPI above).  Objective:   Physical Exam  BP 120/80   Pulse 88   Temp 98.5 F (36.9 C) (Oral)   Wt 283 lb 12 oz (128.7 kg)   SpO2 97%   BMI 46.15 kg/m  Wt Readings from Last 3 Encounters:  03/15/17 283 lb 12 oz (128.7 kg)  02/22/17 281 lb (127.5 kg)  01/27/17 274 lb 8 oz (124.5 kg)    General: Appears her stated age, obese in NAD. Skin: No swelling, redness or warmth noted of BLE. Musculoskeletal: No pain with palpation of the calfs. No difficulty with gait.  Neurological: Alert and oriented. Sensation intact to BLE.  BMET    Component Value Date/Time   NA 136 02/22/2017 1423   K 4.1 02/22/2017 1423   CL 103 02/22/2017 1423   CO2 28 02/22/2017 1423  GLUCOSE 92 02/22/2017 1423   BUN 19 02/22/2017 1423   CREATININE 0.91 02/22/2017 1423   CALCIUM 9.1 02/22/2017 1423   GFRNONAA >60 04/17/2015 0937   GFRAA >60 04/17/2015 0937    Lipid Panel     Component Value Date/Time   CHOL 162 02/22/2017 1423   TRIG 74.0 02/22/2017 1423   HDL 50.80 02/22/2017 1423   CHOLHDL 3 02/22/2017 1423   VLDL 14.8 02/22/2017 1423   LDLCALC 96 02/22/2017 1423    CBC    Component Value Date/Time   WBC 5.9 02/22/2017 1423   RBC 6.11 (H) 02/22/2017 1423   HGB 14.0 02/22/2017 1423   HCT 43.7 02/22/2017 1423   PLT 171.0 02/22/2017 1423   MCV 71.5 (L) 02/22/2017 1423   MCH 23.0 (L) 04/17/2015 0937   MCHC 32.0 02/22/2017 1423   RDW 14.6 02/22/2017 1423   LYMPHSABS 2.2 04/17/2015 0937   MONOABS 0.3 04/17/2015 0937   EOSABS 0.1 04/17/2015 0937   BASOSABS 0.0  04/17/2015 0937    Hgb A1C Lab Results  Component Value Date   HGBA1C 5.4 02/22/2017         Assessment & Plan:   Muscle Cramping of BLE:  Exam benign Stretching exercises given Increase your water intake, 3 16 oz bottles of water daily Start taking a Magnesium supplement OTC  RTC in 3 weeks if symptoms persist, sooner if worsens Webb Silversmith, NP

## 2017-03-15 NOTE — Patient Instructions (Signed)
Medial Head Gastrocnemius Tear Rehab Ask your health care provider which exercises are safe for you. Do exercises exactly as told by your health care provider and adjust them as directed. It is normal to feel mild stretching, pulling, tightness, or discomfort as you do these exercises, but you should stop right away if you feel sudden pain or your pain gets worse.Do not begin these exercises until told by your health care provider. Stretching and range of motion exercises These exercises warm up your muscles and joints and improve the movement and flexibility of your lower leg. These exercises also help to relieve pain and stiffness. Exercise A: Gastrocnemius stretch   1. Sit with your left / right leg extended. 2. Loop a belt or towel around the ball of your left / right foot. The ball of your foot is on the walking surface, right under your toes. 3. Hold both ends of the belt or towel. 4. Keep your left / right ankle and foot relaxed and keep your knee straight while you use the belt or towel to pull your foot and ankle toward you. Stop at the first point of resistance. 5. Hold this position for __________ seconds. Repeat __________ times. Complete this exercise __________ times a day. Exercise B: Ankle alphabet   1. Sit with your left / right leg supported at the lower leg.  Do not rest your foot on anything.  Make sure your foot has room to move freely. 2. Think of your left / right foot as a paintbrush, and move your foot to trace each letter of the alphabet in the air. Keep your hip and knee still while you trace. 3. Trace every letter from A to Z. Repeat __________ times. Complete this exercise __________ times a day. Strengthening exercises These exercises build strength and endurance in your lower leg. Endurance is the ability to use your muscles for a long time, even after they get tired. Exercise C: Plantar flexors with band   1. Sit with your left / right leg extended. 2. Loop  a rubber exercise band or tube around the ball of your left / right foot. The ball of your foot is on the walking surface, right under your toes. 3. While holding both ends of the band or tube, slowly point your toes downward, pushing them away from you. 4. Hold this position for __________ seconds. 5. Slowly return your foot to the starting position. Repeat __________ times. Complete this exercise __________ times a day. Exercise D: Plantar flexors, standing   1. Stand with your feet shoulder-width apart. 2. Place your hands on a wall or table to steady yourself as needed, but try not to use it very much for support. 3. Rise up on your toes. 4. If this exercise is too easy, try these options:  Shift your weight toward your left / right leg until you feel challenged.  If told by your health care provider, stand on your left / right foot only. 5. Hold this position for __________ seconds. Repeat __________ times. Complete this exercise __________ times a day. Exercise E: Plantar flexors, eccentric   1. Stand on the balls of your feet on the edge of a step. The ball of your foot is on the walking surface, right under your toes. 2. Place your hands on a wall or railing for balance as needed, but try not to lean on it for support. 3. Rise up on your toes, using both legs to help. 4. Slowly shift all  of your weight to your left / right foot and lift your other foot off the step. 5. Slowly lower your left / right heel so it drops below the level of the step. You will feel a slight stretch in your left / right calf. 6. Put your other foot back onto the step. Repeat __________ times. Complete this exercise __________ times a day. This information is not intended to replace advice given to you by your health care provider. Make sure you discuss any questions you have with your health care provider. Document Released: 10/26/2005 Document Revised: 07/02/2016 Document Reviewed: 10/08/2015 Elsevier  Interactive Patient Education  2017 [employer].

## 2017-03-22 ENCOUNTER — Other Ambulatory Visit: Payer: Self-pay | Admitting: Internal Medicine

## 2017-03-22 NOTE — Telephone Encounter (Signed)
Tramadol last filled 01/14/17-- please advise

## 2017-03-23 ENCOUNTER — Ambulatory Visit: Payer: Self-pay | Admitting: Physician Assistant

## 2017-03-23 VITALS — BP 144/100 | HR 70 | Temp 98.2°F

## 2017-03-23 DIAGNOSIS — L0390 Cellulitis, unspecified: Secondary | ICD-10-CM

## 2017-03-23 DIAGNOSIS — T7840XA Allergy, unspecified, initial encounter: Secondary | ICD-10-CM

## 2017-03-23 MED ORDER — FLUCONAZOLE 150 MG PO TABS: 150.0000 mg | ORAL_TABLET | Freq: Once | ORAL | 0 refills | Status: AC

## 2017-03-23 MED ORDER — SULFAMETHOXAZOLE-TRIMETHOPRIM 800-160 MG PO TABS: 1.0000 | ORAL_TABLET | Freq: Two times a day (BID) | ORAL | 0 refills | Status: DC

## 2017-03-23 MED ORDER — DEXAMETHASONE SODIUM PHOSPHATE 10 MG/ML IJ SOLN: 10.0000 mg | Freq: Once | INTRAMUSCULAR | Status: DC

## 2017-03-23 NOTE — Progress Notes (Signed)
S: c/o red swollen r hand, states she was taking out the trash last night and thought something bit her, had small red place yesterday and today the area got larger, no fever/chills, did not take anything pta, no hx of htn  O: vitals w elevated bp at 144/100, lungs c ta , cv rrr, r hand is swollen and red at dorsum and spreading down to fingers and up to wrist, small ?bite mark in center of area, no pustules or drainage, n/v intact  A: cellulitis, localized allergic reaction  P: decadron 10 mg IM, septra ds 1 po bid x 7d, diflucan

## 2017-03-23 NOTE — Telephone Encounter (Signed)
Rx called in to pharmacy. 

## 2017-03-23 NOTE — Telephone Encounter (Signed)
Ok to phone in Tramadol 

## 2017-03-29 ENCOUNTER — Encounter: Payer: Self-pay | Admitting: Nurse Practitioner

## 2017-03-29 ENCOUNTER — Ambulatory Visit (INDEPENDENT_AMBULATORY_CARE_PROVIDER_SITE_OTHER): Payer: 59 | Admitting: Nurse Practitioner

## 2017-03-29 VITALS — BP 114/64 | HR 89 | Temp 98.1°F | Ht 65.75 in | Wt 288.0 lb

## 2017-03-29 DIAGNOSIS — L03114 Cellulitis of left upper limb: Secondary | ICD-10-CM

## 2017-03-29 DIAGNOSIS — W57XXXA Bitten or stung by nonvenomous insect and other nonvenomous arthropods, initial encounter: Secondary | ICD-10-CM | POA: Diagnosis not present

## 2017-03-29 DIAGNOSIS — S40862A Insect bite (nonvenomous) of left upper arm, initial encounter: Secondary | ICD-10-CM | POA: Diagnosis not present

## 2017-03-29 MED ORDER — DIPHENHYDRAMINE HCL 25 MG PO TABS: 25.0000 mg | ORAL_TABLET | Freq: Three times a day (TID) | ORAL | 0 refills | Status: DC | PRN

## 2017-03-29 MED ORDER — METHYLPREDNISOLONE ACETATE 40 MG/ML IJ SUSP
40.0000 mg | Freq: Once | INTRAMUSCULAR | Status: AC
  Administered 2017-03-29: 40 mg via INTRAMUSCULAR

## 2017-03-29 NOTE — Progress Notes (Signed)
Subjective:  Patient ID: Valerie Andrews, female    DOB: 07/01/1966  Age: 51 y.o. MRN: [id number]  CC: Insect Bite (arms itching,swelling. not sure what bite her going on for 1 wk. went to the hosp once to get a shot for. )   Wound Check  She was originally treated 3 to 5 days ago. Previous treatment included wound cleansing or irrigation. Her temperature was unmeasured prior to arrival. There has been no drainage from the wound. The redness has worsened. The swelling has worsened. The pain has worsened.  she suspects an insect bite, but unknown what type. No improvement with triamcinolone and topical antbtiotics. Had similar rash on right hand 25month ago. Improved with IM steriod. Did not take oral abx as prescribed. Has old prescription at home.  Outpatient Medications Prior to Visit  Medication Sig Dispense Refill  . Acetaminophen 500 MG coapsule Take 500 mg by mouth every 4 (four) hours as needed for pain. Reported on 03/25/2016    . naproxen (NAPROSYN) 500 MG tablet TAKE 1 TABLET BY MOUTH 2 TIMES DAILY WITH A MEAL. 30 tablet 0  . sulfamethoxazole-trimethoprim (BACTRIM DS,SEPTRA DS) 800-160 MG tablet Take 1 tablet by mouth 2 (two) times daily. 14 tablet 0  . traMADol (ULTRAM) 50 MG tablet TAKE 1 TABLET BY MOUTH EVERY 12 HOURS AS NEEDED 60 tablet 0  . triamcinolone cream (KENALOG) 0.1 % Apply 1 application topically 2 (two) times daily. For maximum 7 days. 15 g 0   Facility-Administered Medications Prior to Visit  Medication Dose Route Frequency Provider Last Rate Last Dose  . dexamethasone (DECADRON) injection 10 mg  10 mg Intramuscular Once Versie Starks, PA-C        ROS See HPI  Objective:  BP 114/64   Pulse 89   Temp 98.1 F (36.7 C)   Ht 5' 5.75" (1.67 m)   Wt 288 lb (130.6 kg)   SpO2 98%   BMI 46.84 kg/m   BP Readings from Last 3 Encounters:  03/29/17 114/64  03/23/17 (!) 144/100  03/15/17 120/80    Wt Readings from Last 3 Encounters:  03/29/17 288 lb (130.6  kg)  03/15/17 283 lb 12 oz (128.7 kg)  02/22/17 281 lb (127.5 kg)    Physical Exam  Constitutional: No distress.  Cardiovascular: Normal rate.   Pulmonary/Chest: Effort normal.  Musculoskeletal: She exhibits edema and tenderness. She exhibits no deformity.  Skin: Skin is warm and dry. Rash noted. There is erythema.     Wound with surrounding erythema and increased warmth. No induration,  Vitals reviewed.   Lab Results  Component Value Date   WBC 5.9 02/22/2017   HGB 14.0 02/22/2017   HCT 43.7 02/22/2017   PLT 171.0 02/22/2017   GLUCOSE 92 02/22/2017   CHOL 162 02/22/2017   TRIG 74.0 02/22/2017   HDL 50.80 02/22/2017   LDLCALC 96 02/22/2017   ALT 15 02/22/2017   AST 13 02/22/2017   NA 136 02/22/2017   K 4.1 02/22/2017   CL 103 02/22/2017   CREATININE 0.91 02/22/2017   BUN 19 02/22/2017   CO2 28 02/22/2017   TSH 2.92 02/22/2017   HGBA1C 5.4 02/22/2017    No results found.  Assessment & Plan:   Sofiah was seen today for insect bite.  Diagnoses and all orders for this visit:  Insect bite of left arm, initial encounter -     methylPREDNISolone acetate (DEPO-MEDROL) injection 40 mg; Inject 1 mL (40 mg total) into the muscle  once. -     diphenhydrAMINE (BENADRYL) 25 MG tablet; Take 1 tablet (25 mg total) by mouth every 8 (eight) hours as needed for itching.  Cellulitis of left upper extremity   I am having Ms. Huckaba start on diphenhydrAMINE. I am also having her maintain her Acetaminophen, triamcinolone cream, traMADol, naproxen, and sulfamethoxazole-trimethoprim. We administered methylPREDNISolone acetate. We will continue to administer dexamethasone.  Meds ordered this encounter  Medications  . methylPREDNISolone acetate (DEPO-MEDROL) injection 40 mg  . diphenhydrAMINE (BENADRYL) 25 MG tablet    Sig: Take 1 tablet (25 mg total) by mouth every 8 (eight) hours as needed for itching.    Dispense:  30 tablet    Refill:  0    Order Specific Question:    Supervising Provider    Answer:   Cassandria Anger [[id number]]    Follow-up: Return if symptoms worsen or fail to improve.  Wilfred Lacy, NP

## 2017-03-29 NOTE — Patient Instructions (Signed)
Apply cold compress as needed. Elevate arm as much as possible.  Complete oral abx as previously prescribed.

## 2017-04-12 ENCOUNTER — Other Ambulatory Visit: Payer: Self-pay | Admitting: Internal Medicine

## 2017-05-06 ENCOUNTER — Other Ambulatory Visit: Payer: Self-pay | Admitting: Internal Medicine

## 2017-05-06 NOTE — Telephone Encounter (Signed)
Ok to phone in Tramadol 

## 2017-05-06 NOTE — Telephone Encounter (Signed)
Tramadol last filled 03/23/17... Please advise

## 2017-05-07 ENCOUNTER — Ambulatory Visit: Payer: Self-pay | Admitting: Physician Assistant

## 2017-05-07 ENCOUNTER — Encounter: Payer: Self-pay | Admitting: Physician Assistant

## 2017-05-07 ENCOUNTER — Other Ambulatory Visit: Payer: Self-pay | Admitting: Internal Medicine

## 2017-05-07 VITALS — BP 100/80 | HR 88 | Temp 98.3°F

## 2017-05-07 DIAGNOSIS — L03114 Cellulitis of left upper limb: Secondary | ICD-10-CM

## 2017-05-07 MED ORDER — SULFAMETHOXAZOLE-TRIMETHOPRIM 800-160 MG PO TABS: 1.0000 | ORAL_TABLET | Freq: Two times a day (BID) | ORAL | 0 refills | Status: DC

## 2017-05-07 NOTE — Progress Notes (Signed)
S: c/o sore on back of neck/upper back area, one on left forearm, no known injury, no drainage from the sites, no fever/chills, no recent blood draws or ivs  O: vitals wnl, nad, skin with red swollen area at left antecubital, + scabbing and scratch marks on upper back, no open lesions, no vesicles, no drainage, n/v intact  A: cellulitis  P: septra ds 1 po bid

## 2017-05-07 NOTE — Telephone Encounter (Signed)
Rx called in to pharmacy. 

## 2017-05-11 NOTE — Telephone Encounter (Signed)
Pt said [facility] did not have naproxen; I spoke with Pam at [facility] and she found the naproxen rx in pts file; will get ready for pick up. Pt voiced understanding.

## 2017-05-31 ENCOUNTER — Other Ambulatory Visit: Payer: Self-pay | Admitting: Internal Medicine

## 2017-05-31 NOTE — Telephone Encounter (Signed)
Ok to phone in Tramadol 

## 2017-05-31 NOTE — Telephone Encounter (Signed)
Last filled 05/06/17... Please advise if okay to call in on Fri for Sat

## 2017-06-03 ENCOUNTER — Other Ambulatory Visit: Payer: Self-pay | Admitting: Internal Medicine

## 2017-06-03 DIAGNOSIS — K219 Gastro-esophageal reflux disease without esophagitis: Secondary | ICD-10-CM | POA: Diagnosis not present

## 2017-06-03 DIAGNOSIS — R109 Unspecified abdominal pain: Secondary | ICD-10-CM | POA: Diagnosis not present

## 2017-06-03 DIAGNOSIS — N838 Other noninflammatory disorders of ovary, fallopian tube and broad ligament: Secondary | ICD-10-CM | POA: Diagnosis not present

## 2017-06-03 DIAGNOSIS — M1990 Unspecified osteoarthritis, unspecified site: Secondary | ICD-10-CM | POA: Diagnosis not present

## 2017-06-03 DIAGNOSIS — Z79899 Other long term (current) drug therapy: Secondary | ICD-10-CM | POA: Diagnosis not present

## 2017-06-03 DIAGNOSIS — N12 Tubulo-interstitial nephritis, not specified as acute or chronic: Secondary | ICD-10-CM | POA: Diagnosis not present

## 2017-06-03 DIAGNOSIS — Z791 Long term (current) use of non-steroidal anti-inflammatories (NSAID): Secondary | ICD-10-CM | POA: Diagnosis not present

## 2017-06-03 NOTE — Telephone Encounter (Signed)
Rx called in to pharmacy. 

## 2017-06-21 ENCOUNTER — Other Ambulatory Visit: Payer: Self-pay | Admitting: Internal Medicine

## 2017-07-09 ENCOUNTER — Other Ambulatory Visit: Payer: Self-pay | Admitting: Internal Medicine

## 2017-07-09 NOTE — Telephone Encounter (Signed)
Ok to phone in Tramadol, Naproxen sent electronically

## 2017-07-09 NOTE — Telephone Encounter (Signed)
Last filled 05/31/17... please advise

## 2017-07-09 NOTE — Telephone Encounter (Signed)
Rx called in to pharmacy. 

## 2017-07-29 ENCOUNTER — Ambulatory Visit (INDEPENDENT_AMBULATORY_CARE_PROVIDER_SITE_OTHER): Payer: 59 | Admitting: Nurse Practitioner

## 2017-07-29 ENCOUNTER — Encounter: Payer: Self-pay | Admitting: Nurse Practitioner

## 2017-07-29 VITALS — BP 116/78 | HR 84 | Temp 98.0°F | Ht 65.75 in | Wt 292.0 lb

## 2017-07-29 DIAGNOSIS — R350 Frequency of micturition: Secondary | ICD-10-CM

## 2017-07-29 DIAGNOSIS — K5900 Constipation, unspecified: Secondary | ICD-10-CM

## 2017-07-29 LAB — POCT URINALYSIS DIPSTICK
BILIRUBIN UA: NEGATIVE
Blood, UA: NEGATIVE
Glucose, UA: NEGATIVE
Ketones, UA: NEGATIVE
LEUKOCYTES UA: NEGATIVE
NITRITE UA: NEGATIVE
PH UA: 6 (ref 5.0–8.0)
PROTEIN UA: NEGATIVE
Spec Grav, UA: >=1.03 — AB (ref 1.010–1.025)
Urobilinogen, UA: 0.2 E.U./dL

## 2017-07-29 MED ORDER — DOCUSATE SODIUM 100 MG PO TABS: 100.0000 mg | ORAL_TABLET | Freq: Two times a day (BID) | ORAL | 0 refills | Status: DC

## 2017-07-29 MED ORDER — LACTULOSE 10 GM/15ML PO SOLN: 10.0000 g | Freq: Two times a day (BID) | ORAL | 0 refills | Status: DC | PRN

## 2017-07-29 NOTE — Progress Notes (Signed)
Subjective:  Patient ID: Valerie Andrews, female    DOB: 12/25/1965  Age: 51 y.o. MRN: [id number]  CC: GI Problem (BM issue--frequent urinate--going on for 1 wk. )   GI Problem  The primary symptoms include abdominal pain. Primary symptoms do not include fever, weight loss, fatigue, nausea, vomiting, diarrhea, melena, hematemesis, jaundice, hematochezia, dysuria, myalgias, arthralgias or rash. Primary symptoms comment: constipation (pellets), last normal BM 2weeks ago. The illness began more than 7 days ago. The onset was gradual. The problem has not changed since onset. The illness is also significant for bloating, constipation and tenesmus. The illness does not include chills. Associated medical issues do not include irritable bowel syndrome or hemorrhoids.  Urinary Frequency   This is a new problem. The current episode started 1 to 4 weeks ago. The problem occurs every urination. The problem has been unchanged. The pain is at a severity of 0/10. The patient is experiencing no pain. There has been no fever. Associated symptoms include frequency. Pertinent negatives include no chills, nausea or vomiting. She has tried nothing for the symptoms. Her past medical history is significant for urinary stasis.   No melena or hematochezia. Mucus per rectum. Rectal pain with BM No FH of colon CA or polyps or diverticulosis  Outpatient Medications Prior to Visit  Medication Sig Dispense Refill  . Acetaminophen 500 MG coapsule Take 500 mg by mouth every 4 (four) hours as needed for pain. Reported on 03/25/2016    . naproxen (NAPROSYN) 500 MG tablet TAKE 1 TABLET BY MOUTH 2 TIMES DAILY WITH A MEAL. 30 tablet 0  . traMADol (ULTRAM) 50 MG tablet TAKE 1 TABLET BY MOUTH EVERY 12 HOURS AS NEEDED 60 tablet 0  . triamcinolone cream (KENALOG) 0.1 % Apply 1 application topically 2 (two) times daily. For maximum 7 days. 15 g 0  . diphenhydrAMINE (BENADRYL) 25 MG tablet Take 1 tablet (25 mg total) by mouth every 8  (eight) hours as needed for itching. (Patient not taking: Reported on 07/29/2017) 30 tablet 0  . sulfamethoxazole-trimethoprim (BACTRIM DS,SEPTRA DS) 800-160 MG tablet Take 1 tablet by mouth 2 (two) times daily. (Patient not taking: Reported on 07/29/2017) 14 tablet 0   Facility-Administered Medications Prior to Visit  Medication Dose Route Frequency Provider Last Rate Last Dose  . dexamethasone (DECADRON) injection 10 mg  10 mg Intramuscular Once Versie Starks, PA-C        ROS See HPI  Objective:  BP 116/78   Pulse 84   Temp 98 F (36.7 C)   Ht 5' 5.75" (1.67 m)   Wt 292 lb (132.5 kg)   SpO2 98%   BMI 47.49 kg/m   BP Readings from Last 3 Encounters:  07/29/17 116/78  05/07/17 100/80  03/29/17 114/64    Wt Readings from Last 3 Encounters:  07/29/17 292 lb (132.5 kg)  03/29/17 288 lb (130.6 kg)  03/15/17 283 lb 12 oz (128.7 kg)    Physical Exam  Constitutional: She is oriented to person, place, and time. No distress.  Cardiovascular: Normal rate.   Pulmonary/Chest: Effort normal.  Abdominal: Soft. Bowel sounds are normal. There is no tenderness. There is no rebound and no guarding.  No CVA tenderness  Genitourinary:  Genitourinary Comments: Patient declined rectal exam  Neurological: She is alert and oriented to person, place, and time.  Skin: Skin is warm and dry.  Psychiatric: She has a normal mood and affect. Her behavior is normal.  Vitals reviewed.   Lab Results  Component Value Date   WBC 5.9 02/22/2017   HGB 14.0 02/22/2017   HCT 43.7 02/22/2017   PLT 171.0 02/22/2017   GLUCOSE 92 02/22/2017   CHOL 162 02/22/2017   TRIG 74.0 02/22/2017   HDL 50.80 02/22/2017   LDLCALC 96 02/22/2017   ALT 15 02/22/2017   AST 13 02/22/2017   NA 136 02/22/2017   K 4.1 02/22/2017   CL 103 02/22/2017   CREATININE 0.91 02/22/2017   BUN 19 02/22/2017   CO2 28 02/22/2017   TSH 2.92 02/22/2017   HGBA1C 5.4 02/22/2017    Assessment & Plan:   Vertis was seen today  for gi problem.  Diagnoses and all orders for this visit:  Constipation, unspecified constipation type -     lactulose (CHRONULAC) 10 GM/15ML solution; Take 15 mLs (10 g total) by mouth 2 (two) times daily as needed for severe constipation. -     Docusate Sodium 100 MG capsule; Take 1 tablet (100 mg total) by mouth 2 (two) times daily.  Urinary frequency -     POCT urinalysis dipstick   I have discontinued Ms. Philemon's diphenhydrAMINE and sulfamethoxazole-trimethoprim. I am also having her start on lactulose and Docusate Sodium. Additionally, I am having her maintain her Acetaminophen, triamcinolone cream, traMADol, and naproxen. We will continue to administer dexamethasone.  Meds ordered this encounter  Medications  . lactulose (CHRONULAC) 10 GM/15ML solution    Sig: Take 15 mLs (10 g total) by mouth 2 (two) times daily as needed for severe constipation.    Dispense:  236 mL    Refill:  0    Order Specific Question:   Supervising Provider    Answer:   Cassandria Anger [[id number]]  . Docusate Sodium 100 MG capsule    Sig: Take 1 tablet (100 mg total) by mouth 2 (two) times daily.    Dispense:  10 tablet    Refill:  0    Order Specific Question:   Supervising Provider    Answer:   Cassandria Anger [[id number]]    Follow-up: Return if symptoms worsen or fail to improve.  Wilfred Lacy, NP

## 2017-07-29 NOTE — Patient Instructions (Addendum)
Increase oral hydration. Call office if no improvement in 2-3days.  Constipation, Adult Constipation is when a person has fewer bowel movements in a week than normal, has difficulty having a bowel movement, or has stools that are dry, hard, or larger than normal. Constipation may be caused by an underlying condition. It may become worse with age if a person takes certain medicines and does not take in enough fluids. Follow these instructions at home: Eating and drinking   Eat foods that have a lot of fiber, such as fresh fruits and vegetables, whole grains, and beans.  Limit foods that are high in fat, low in fiber, or overly processed, such as french fries, hamburgers, cookies, candies, and soda.  Drink enough fluid to keep your urine clear or pale yellow. General instructions  Exercise regularly or as told by your health care provider.  Go to the restroom when you have the urge to go. Do not hold it in.  Take over-the-counter and prescription medicines only as told by your health care provider. These include any fiber supplements.  Practice pelvic floor retraining exercises, such as deep breathing while relaxing the lower abdomen and pelvic floor relaxation during bowel movements.  Watch your condition for any changes.  Keep all follow-up visits as told by your health care provider. This is important. Contact a health care provider if:  You have pain that gets worse.  You have a fever.  You do not have a bowel movement after 4 days.  You vomit.  You are not hungry.  You lose weight.  You are bleeding from the anus.  You have thin, pencil-like stools. Get help right away if:  You have a fever and your symptoms suddenly get worse.  You leak stool or have blood in your stool.  Your abdomen is bloated.  You have severe pain in your abdomen.  You feel dizzy or you faint. This information is not intended to replace advice given to you by your health care provider.  Make sure you discuss any questions you have with your health care provider. Document Released: 07/24/2004 Document Revised: 05/15/2016 Document Reviewed: 04/15/2016 Elsevier Interactive Patient Education  2017 [employer].

## 2017-08-04 ENCOUNTER — Other Ambulatory Visit: Payer: Self-pay | Admitting: Internal Medicine

## 2017-08-06 ENCOUNTER — Telehealth: Payer: Self-pay | Admitting: Internal Medicine

## 2017-08-06 NOTE — Telephone Encounter (Signed)
PT is requesting a call concerning 9/20 lab. She cannot view it in MyChart.

## 2017-08-06 NOTE — Telephone Encounter (Signed)
Only lab done 9/20 was urinalysis. What is she referring to?

## 2017-08-09 NOTE — Telephone Encounter (Signed)
We discussed her urinalysis result while she was in the office. There is no other labs to review.

## 2017-08-09 NOTE — Telephone Encounter (Signed)
I'm guessing the urinalysis. Pt just said she had an alert that labs were avail on MyChart but she was unable to view them. I'm not clinical so I could not review.

## 2017-08-26 ENCOUNTER — Other Ambulatory Visit: Payer: Self-pay | Admitting: Internal Medicine

## 2017-09-13 ENCOUNTER — Other Ambulatory Visit: Payer: Self-pay | Admitting: Internal Medicine

## 2017-09-13 NOTE — Telephone Encounter (Signed)
Tramadol last filled 07/09/17... Please advise

## 2017-09-14 NOTE — Telephone Encounter (Signed)
Rx called in to pharmacy. 

## 2017-09-14 NOTE — Telephone Encounter (Signed)
Ok to phone in Tramadol 

## 2017-10-06 ENCOUNTER — Other Ambulatory Visit: Payer: Self-pay | Admitting: Internal Medicine

## 2017-10-21 ENCOUNTER — Other Ambulatory Visit: Payer: Self-pay | Admitting: Internal Medicine

## 2017-10-27 ENCOUNTER — Other Ambulatory Visit: Payer: Self-pay | Admitting: Internal Medicine

## 2017-10-27 NOTE — Telephone Encounter (Signed)
Last filled 09/14/2017... Please advise

## 2017-10-27 NOTE — Telephone Encounter (Signed)
Ok to phone in Ultram.

## 2017-10-28 NOTE — Telephone Encounter (Signed)
Rx called in to pharmacy. 

## 2017-11-03 ENCOUNTER — Telehealth: Payer: Self-pay | Admitting: Internal Medicine

## 2017-11-03 NOTE — Telephone Encounter (Signed)
Pt scheduled appt with Avie Echevaria NP on 11/04/17.

## 2017-11-03 NOTE — Telephone Encounter (Signed)
Pt wanting rx to be called in for sore throat and cough. Pt does not want to schedule a appt at this time if possible.

## 2017-11-03 NOTE — Telephone Encounter (Signed)
Copied from [place]. Topic: General - Other >> Nov 03, 2017 10:21 AM Neva Seat wrote: Pt wants Rx called in for her sore throat and coughing - she wanted to bypass an appt if possible.   [place] -

## 2017-11-04 ENCOUNTER — Ambulatory Visit: Payer: Self-pay | Admitting: Internal Medicine

## 2017-11-05 DIAGNOSIS — J0100 Acute maxillary sinusitis, unspecified: Secondary | ICD-10-CM | POA: Diagnosis not present

## 2017-11-17 ENCOUNTER — Other Ambulatory Visit: Payer: Self-pay | Admitting: Internal Medicine

## 2017-11-18 DIAGNOSIS — Z791 Long term (current) use of non-steroidal anti-inflammatories (NSAID): Secondary | ICD-10-CM | POA: Diagnosis not present

## 2017-11-18 DIAGNOSIS — M1990 Unspecified osteoarthritis, unspecified site: Secondary | ICD-10-CM | POA: Diagnosis not present

## 2017-11-18 DIAGNOSIS — B9689 Other specified bacterial agents as the cause of diseases classified elsewhere: Secondary | ICD-10-CM | POA: Diagnosis not present

## 2017-11-18 DIAGNOSIS — N898 Other specified noninflammatory disorders of vagina: Secondary | ICD-10-CM | POA: Diagnosis not present

## 2017-11-18 DIAGNOSIS — N760 Acute vaginitis: Secondary | ICD-10-CM | POA: Diagnosis not present

## 2017-11-18 DIAGNOSIS — Z79899 Other long term (current) drug therapy: Secondary | ICD-10-CM | POA: Diagnosis not present

## 2017-12-08 ENCOUNTER — Encounter: Payer: 59 | Admitting: Maternal Newborn

## 2017-12-21 ENCOUNTER — Other Ambulatory Visit: Payer: Self-pay | Admitting: Internal Medicine

## 2017-12-22 ENCOUNTER — Encounter: Payer: Self-pay | Admitting: Emergency Medicine

## 2017-12-22 ENCOUNTER — Emergency Department: Payer: 59

## 2017-12-22 ENCOUNTER — Emergency Department
Admission: EM | Admit: 2017-12-22 | Discharge: 2017-12-22 | Disposition: A | Discharge status: Home / Self Care | Payer: 59 | Attending: Emergency Medicine | Admitting: Emergency Medicine

## 2017-12-22 DIAGNOSIS — Z79899 Other long term (current) drug therapy: Secondary | ICD-10-CM | POA: Diagnosis not present

## 2017-12-22 DIAGNOSIS — M25562 Pain in left knee: Secondary | ICD-10-CM | POA: Insufficient documentation

## 2017-12-22 DIAGNOSIS — S8992XA Unspecified injury of left lower leg, initial encounter: Secondary | ICD-10-CM | POA: Diagnosis not present

## 2017-12-22 NOTE — ED Notes (Signed)
Pt verbalized understanding of discharge instructions. NAD at this time. 

## 2017-12-22 NOTE — Discharge Instructions (Signed)
Continue your regular medications.  Apply ice to the area that hurts.  Follow-up with orthopedics for your already scheduled appointment.

## 2017-12-22 NOTE — ED Triage Notes (Signed)
Pt comes into the ED via POV c/o left knee pain after tripping over a rug last week.  Patient is ambulatory to triage at this time and in NAD with good mobility in the knee currently.

## 2017-12-22 NOTE — ED Provider Notes (Signed)
[facility] Emergency Department Provider Note  ____________________________________________   First MD Initiated Contact with Patient 12/22/17 1341     (approximate)  I have reviewed the triage vital signs and the nursing notes.   HISTORY  Chief Complaint Knee Pain    HPI Valerie Andrews is a 52 y.o. female who presents to the emergency department complaining of left knee pain.  She tripped over a rug last week and is continued to have pain at the joint line.  She has a history of arthritis and is followed at [facility] clinic for the same knee pain.  She has been taking her regular medications for the pain.  She denies any other injuries  Past Medical History:  Diagnosis Date  . Arthritis   . Chicken pox   . GERD (gastroesophageal reflux disease)     Patient Active Problem List   Diagnosis Date Noted  . Morbid obesity ([place]) 02/22/2017  . Esophageal reflux 04/25/2015  . Arthritis of both knees 04/25/2015    History reviewed. No pertinent surgical history.  Prior to Admission medications   Medication Sig Start Date End Date Taking? Authorizing Provider  Acetaminophen 500 MG coapsule Take 500 mg by mouth every 4 (four) hours as needed for pain. Reported on 03/25/2016    [provider]  Docusate Sodium 100 MG capsule Take 1 tablet (100 mg total) by mouth 2 (two) times daily. 07/29/17   Nche, Charlene Brooke, NP  lactulose (CHRONULAC) 10 GM/15ML solution Take 15 mLs (10 g total) by mouth 2 (two) times daily as needed for severe constipation. 07/29/17   Nche, Charlene Brooke, NP  naproxen (NAPROSYN) 500 MG tablet TAKE 1 TABLET BY MOUTH 2 TIMES DAILY WITH A MEAL. 12/21/17   Jearld Fenton, NP  traMADol (ULTRAM) 50 MG tablet TAKE 1 TABLET BY MOUTH EVERY 12 HOURS AS NEEDED 10/27/17   Jearld Fenton, NP  triamcinolone cream (KENALOG) 0.1 % Apply 1 application topically 2 (two) times daily. For maximum 7 days. 01/27/17   Elby Beck, FNP     Allergies Methocarbamol  Family History  Problem Relation Age of Onset  . Hypertension Mother   . Stroke Father   . Diabetes Paternal Grandmother   . Cancer Neg Hx   . Breast cancer Neg Hx     Social History Social History   Tobacco Use  . Smoking status: Never Smoker  . Smokeless tobacco: Never Used  Substance Use Topics  . Alcohol use: No    Alcohol/week: 0.0 oz  . Drug use: No    Review of Systems  Constitutional: No fever/chills Eyes: No visual changes. ENT: No sore throat. Respiratory: Denies cough Genitourinary: Negative for dysuria. Musculoskeletal: Negative for back pain.  Positive for left knee pain, denies hip pain Skin: Negative for rash.    ____________________________________________   PHYSICAL EXAM:  VITAL SIGNS: ED Triage Vitals  Enc Vitals Group     BP 12/22/17 1254 115/73     Pulse Rate 12/22/17 1254 74     Resp 12/22/17 1254 18     Temp 12/22/17 1254 97.9 F (36.6 C)     Temp Source 12/22/17 1254 Oral     SpO2 12/22/17 1254 98 %     Weight 12/22/17 1254 250 lb (113.4 kg)     Height 12/22/17 1254 5\' 7"  (1.702 m)     Head Circumference --      Peak Flow --      Pain Score  12/22/17 1257 8     Pain Loc --      Pain Edu? --      Excl. in [place]? --     Constitutional: Alert and oriented. Well appearing and in no acute distress. Eyes: Conjunctivae are normal.  Head: Atraumatic. Nose: No congestion/rhinnorhea. Mouth/Throat: Mucous membranes are moist.   Cardiovascular: Normal rate, regular rhythm.  Heart sounds are normal Respiratory: Normal respiratory effort.  No retractions lungs clear to auscultation GU: deferred Musculoskeletal: FROM all extremities, warm and well perfused.  The knee is large so it is hard to determine if it is swollen when compared to the right knee.  The knee is tender at the joint line.  Is tender at the patella.  She does have full range of motion is able to bear weight and walk to the door without difficulty.   Neurovascular is intact Neurologic:  Normal speech and language.  Skin:  Skin is warm, dry and intact. No rash noted. Psychiatric: Mood and affect are normal. Speech and behavior are normal.  ____________________________________________   LABS (all labs ordered are listed, but only abnormal results are displayed)  Labs Reviewed - No data to display ____________________________________________   ____________________________________________  RADIOLOGY  X-ray of the right knee is negative for fracture.  Positive for osteoarthritis  ____________________________________________   PROCEDURES  Procedure(s) performed: No  Procedures    ____________________________________________   INITIAL IMPRESSION / ASSESSMENT AND PLAN / ED COURSE  Pertinent labs & imaging results that were available during my care of the patient were reviewed by me and considered in my medical decision making (see chart for details).  Patient is a 52 year old female presenting to the emergency department complaining of left knee pain since last week.  She tripped over a rug.  On physical exam the left knee is tender at the joint line and patella.  Patient has full range of motion is able to ambulate without difficulty  X-ray of the left knee is negative for fracture  X-ray results were discussed with patient.  She is to follow-up with her regular orthopedic doctor.  She is to continue her medications.  She is to apply ice to decrease the inflammation due to the fall.  If she is worsening she should return the emergency department.  Patient states she understands comply with the recommendations.  She was discharged in stable condition     As part of my medical decision making, I reviewed the following data within the [place] notes reviewed and incorporated, Old chart reviewed, Radiograph reviewed x-ray of the knee is negative for acute injury, Notes from prior ED visits  ____________________________________________   FINAL CLINICAL IMPRESSION(S) / ED DIAGNOSES  Final diagnoses:  Acute pain of left knee      NEW MEDICATIONS STARTED DURING THIS VISIT:  New Prescriptions   No medications on file     Note:  This document was prepared using Dragon voice recognition software and may include unintentional dictation errors.    Versie Starks, PA-C 12/22/17 [place], [place], MD 12/23/17 [phone]

## 2017-12-30 DIAGNOSIS — M25562 Pain in left knee: Secondary | ICD-10-CM | POA: Diagnosis not present

## 2018-01-12 ENCOUNTER — Other Ambulatory Visit: Payer: Self-pay | Admitting: Internal Medicine

## 2018-02-01 ENCOUNTER — Other Ambulatory Visit: Payer: Self-pay | Admitting: Internal Medicine

## 2018-02-01 NOTE — Telephone Encounter (Signed)
This is not initial RX. Will refill

## 2018-02-01 NOTE — Telephone Encounter (Signed)
Last filled 10/27/17... Please advise-- also read note from pharmacy due to the sig and [place] stop act requirements

## 2018-02-21 ENCOUNTER — Other Ambulatory Visit: Payer: Self-pay | Admitting: Internal Medicine

## 2018-03-09 ENCOUNTER — Ambulatory Visit: Payer: Self-pay

## 2018-03-09 NOTE — Telephone Encounter (Signed)
Pt. Reports she has had numbness and tingling to her right arm x 2 weeks. Reports it comes and goes. No changes in strength or grasping items.Denies neck or arm injury. States she had a headache Sunday and BP that day was 141/90. No headache today or any other symptoms. Appointment made for tomorrow. Instructed if symptoms worsen to go to ED. Verbalizes understanding.     Reason for Disposition . [1] Numbness or tingling in one or both hands AND [2] is a chronic symptom (recurrent or ongoing AND present > 4 weeks)  Answer Assessment - Initial Assessment Questions 1. SYMPTOM: "What is the main symptom you are concerned about?" (e.g., weakness, numbness)     Right arm numbness and tingling 2. ONSET: "When did this start?" (minutes, hours, days; while sleeping)     Started 2 weeks 3. LAST NORMAL: "When was the last time you were normal (no symptoms)?"     3 weeks 4. PATTERN "Does this come and go, or has it been constant since it started?"  "Is it present now?"     Comes and goes 5. CARDIAC SYMPTOMS: "Have you had any of the following symptoms: chest pain, difficulty breathing, palpitations?"     No 6. NEUROLOGIC SYMPTOMS: "Have you had any of the following symptoms: headache, dizziness, vision loss, double vision, changes in speech, unsteady on your feet?"     Headache Sunday 7. OTHER SYMPTOMS: "Do you have any other symptoms?"     No 8. PREGNANCY: "Is there any chance you are pregnant?" "When was your last menstrual period?"     No  Protocols used: NEUROLOGIC DEFICIT-A-AH

## 2018-03-09 NOTE — Telephone Encounter (Signed)
Pt has appt 03/10/18 at 8:45 with Gentry Fitz NP.

## 2018-03-09 NOTE — Telephone Encounter (Signed)
Noted, will evaluate. 

## 2018-03-10 ENCOUNTER — Encounter: Payer: Self-pay | Admitting: Primary Care

## 2018-03-10 ENCOUNTER — Ambulatory Visit: Payer: 59 | Admitting: Primary Care

## 2018-03-10 VITALS — BP 118/80 | HR 79 | Temp 98.0°F | Ht 67.0 in | Wt 284.5 lb

## 2018-03-10 DIAGNOSIS — R202 Paresthesia of skin: Secondary | ICD-10-CM | POA: Diagnosis not present

## 2018-03-10 MED ORDER — PREDNISONE 20 MG PO TABS: ORAL_TABLET | ORAL | 0 refills | Status: DC
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
## 2018-03-10 NOTE — Patient Instructions (Signed)
Start Prednisone 20 mg tablets. Take 2 tablets for 4 days, then 1 tablet for 4 days. Do not take your Naproxen while on prednisone. Okay to take Tylenol.  Purchase a supportive wrist brace as discussed, wear during work hours.   Please follow up with Rollene Fare if no improvement in 1-2 weeks.  It was a pleasure meeting you!

## 2018-03-29 ENCOUNTER — Other Ambulatory Visit: Payer: Self-pay | Admitting: Internal Medicine

## 2018-03-29 NOTE — Telephone Encounter (Signed)
Tramadol last filled 02/04/2018... Please advise

## 2018-04-11 ENCOUNTER — Other Ambulatory Visit: Payer: Self-pay | Admitting: Internal Medicine

## 2018-04-18 ENCOUNTER — Encounter: Payer: Self-pay | Admitting: Internal Medicine

## 2018-04-18 ENCOUNTER — Ambulatory Visit: Payer: 59 | Admitting: Internal Medicine

## 2018-04-18 VITALS — BP 120/78 | HR 96 | Temp 98.9°F | Ht 65.75 in | Wt 288.2 lb

## 2018-04-18 DIAGNOSIS — K219 Gastro-esophageal reflux disease without esophagitis: Secondary | ICD-10-CM

## 2018-04-18 DIAGNOSIS — R609 Edema, unspecified: Secondary | ICD-10-CM | POA: Diagnosis not present

## 2018-04-18 DIAGNOSIS — M170 Bilateral primary osteoarthritis of knee: Secondary | ICD-10-CM | POA: Diagnosis not present

## 2018-04-18 MED ORDER — TRAMADOL HCL 50 MG PO TABS: 50.0000 mg | ORAL_TABLET | Freq: Two times a day (BID) | ORAL | 0 refills | Status: DC | PRN

## 2018-04-18 MED ORDER — NAPROXEN 500 MG PO TABS: 500.0000 mg | ORAL_TABLET | Freq: Every day | ORAL | 5 refills | Status: DC

## 2018-04-18 MED ORDER — HYDROCHLOROTHIAZIDE 25 MG PO TABS: 25.0000 mg | ORAL_TABLET | Freq: Every day | ORAL | 2 refills | Status: DC

## 2018-04-18 NOTE — Assessment & Plan Note (Signed)
Discussed avoiding foods that trigger your reflux Continue Tums prn Let me know if occurs > 3 x week

## 2018-04-18 NOTE — Assessment & Plan Note (Signed)
Discussed how weight loss can help improve joint pain Continue Naproxen and Tramadol, refilled today Referral to orthopedics for further evaluation

## 2018-04-18 NOTE — Patient Instructions (Signed)
Edema Edema is when you have too much fluid in your body or under your skin. Edema may make your legs, feet, and ankles swell up. Swelling is also common in looser tissues, like around your eyes. This is a common condition. It gets more common as you get older. There are many possible causes of edema. Eating too much salt (sodium) and being on your feet or sitting for a long time can cause edema in your legs, feet, and ankles. Hot weather may make edema worse. Edema is usually painless. Your skin may look swollen or shiny. Follow these instructions at home:  Keep the swollen body part raised (elevated) above the level of your heart when you are sitting or lying down.  Do not sit still or stand for a long time.  Do not wear tight clothes. Do not wear garters on your upper legs.  Exercise your legs. This can help the swelling go down.  Wear elastic bandages or support stockings as told by your doctor.  Eat a low-salt (low-sodium) diet to reduce fluid as told by your doctor.  Depending on the cause of your swelling, you may need to limit how much fluid you drink (fluid restriction).  Take over-the-counter and prescription medicines only as told by your doctor. Contact a doctor if:  Treatment is not working.  You have heart, liver, or kidney disease and have symptoms of edema.  You have sudden and unexplained weight gain. Get help right away if:  You have shortness of breath or chest pain.  You cannot breathe when you lie down.  You have pain, redness, or warmth in the swollen areas.  You have heart, liver, or kidney disease and get edema all of a sudden.  You have a fever and your symptoms get worse all of a sudden. Summary  Edema is when you have too much fluid in your body or under your skin.  Edema may make your legs, feet, and ankles swell up. Swelling is also common in looser tissues, like around your eyes.  Raise (elevate) the swollen body part above the level of your  heart when you are sitting or lying down.  Follow your doctor's instructions about diet and how much fluid you can drink (fluid restriction). This information is not intended to replace advice given to you by your health care provider. Make sure you discuss any questions you have with your health care provider. Document Released: 04/13/2008 Document Revised: 11/13/2016 Document Reviewed: 11/13/2016 Elsevier Interactive Patient Education  2017 [employer]  

## 2018-04-18 NOTE — Progress Notes (Signed)
Subjective:    Patient ID: Valerie Andrews, female    DOB: 05/12/1966, 52 y.o.   MRN: [id number]  HPI  Pt presents to the clinic today to follow up chronic conditions.  Osteoarthritis: Mainly in her knees. She is taking Naproxen daily and Ultram as needed for severe pain. She has gotten knee injections by Dr. Eliberto Iv[facility] at [facility]. She would like a referral to a different orthopedist at this time.  GERD: Triggered by spicy foods. She takes Tums as needed with good relief.  Review of Systems      Past Medical History:  Diagnosis Date  . Arthritis   . Chicken pox   . GERD (gastroesophageal reflux disease)     Current Outpatient Medications  Medication Sig Dispense Refill  . Acetaminophen 500 MG coapsule Take 500 mg by mouth every 4 (four) hours as needed for pain. Reported on 03/25/2016    . naproxen (NAPROSYN) 500 MG tablet TAKE 1 TABLET BY MOUTH TWO TIMES DAILY WITH A MEAL 30 tablet 0  . predniSONE (DELTASONE) 20 MG tablet Take 2 tablets for 4 days, then 1 tablet for 4 days. 12 tablet 0  . traMADol (ULTRAM) 50 MG tablet TAKE 1 TABLET BY MOUTH EVERY 12 HOURS AS NEEDED     Current Facility-Administered Medications  Medication Dose Route Frequency Provider Last Rate Last Dose  . dexamethasone (DECADRON) injection 10 mg  10 mg Intramuscular Once Versie Starks, PA-C        Allergies  Allergen Reactions  . Methocarbamol Other (See Comments)    headache    Family History  Problem Relation Age of Onset  . Hypertension Mother   . Stroke Father   . Diabetes Paternal Grandmother   . Cancer Neg Hx   . Breast cancer Neg Hx     Social History   Socioeconomic History  . Marital status: Single    Spouse name: Not on file  . Number of children: Not on file  . Years of education: Not on file  . Highest education level: Not on file  Occupational History  . Not on file  Social Needs  . Financial resource strain: Not on file  . Food insecurity:    Worry: Not on file   Inability: Not on file  . Transportation needs:    Medical: Not on file    Non-medical: Not on file  Tobacco Use  . Smoking status: Never Smoker  . Smokeless tobacco: Never Used  Substance and Sexual Activity  . Alcohol use: No    Alcohol/week: 0.0 oz  . Drug use: No  . Sexual activity: Not Currently  Lifestyle  . Physical activity:    Days per week: Not on file    Minutes per session: Not on file  . Stress: Not on file  Relationships  . Social connections:    Talks on phone: Not on file    Gets together: Not on file    Attends religious service: Not on file    Active member of club or organization: Not on file    Attends meetings of clubs or organizations: Not on file    Relationship status: Not on file  . Intimate partner violence:    Fear of current or ex partner: Not on file    Emotionally abused: Not on file    Physically abused: Not on file    Forced sexual activity: Not on file  Other Topics Concern  . Not on file  Social  History Narrative  . Not on file     Constitutional: Denies fever, malaise, fatigue, headache or abrupt weight changes.  Respiratory: Denies difficulty breathing, shortness of breath, cough or sputum production.   Cardiovascular: Pt reports swelling of feet. Denies chest pain, chest tightness, palpitations or swelling in the hands Gastrointestinal: Pt reports intermittent reflux. Denies abdominal pain, bloating, constipation, diarrhea or blood in the stool.  GU: Denies urgency, frequency, pain with urination, burning sensation, blood in urine, odor or discharge. Musculoskeletal: Pt reports joint pain. Denies decrease in range of motion, difficulty with gait, muscle pain or joint swelling.  Skin: Denies redness, rashes, lesions or ulcercations.   No other specific complaints in a complete review of systems (except as listed in HPI above).  Objective:   Physical Exam   BP 120/78   Pulse 96   Temp 98.9 F (37.2 C) (Oral)   Ht 5' 5.75" (1.67  m)   Wt 288 lb 4 oz (130.7 kg)   LMP 04/07/2018 (Exact Date)   BMI 46.88 kg/m  Wt Readings from Last 3 Encounters:  04/18/18 288 lb 4 oz (130.7 kg)  03/10/18 284 lb 8 oz (129 kg)  12/22/17 250 lb (113.4 kg)    General: Appears her stated age, obese in NAD. Cardiovascular: Normal rate and rhythm. S1,S2 noted.  No murmur, rubs or gallops noted. Thick calves, trace BLE edema.  Pulmonary/Chest: Normal effort and positive vesicular breath sounds. No respiratory distress. No wheezes, rales or ronchi noted.  Abdomen: Soft and nontender. Normal bowel sounds.  Musculoskeletal: Normal flexion and extension of bilateral knees. No joint effusion noted. Gait slow and steady without device.  Neurological: Alert and oriented.    BMET    Component Value Date/Time   NA 136 02/22/2017 1423   K 4.1 02/22/2017 1423   CL 103 02/22/2017 1423   CO2 28 02/22/2017 1423   GLUCOSE 92 02/22/2017 1423   BUN 19 02/22/2017 1423   CREATININE 0.91 02/22/2017 1423   CALCIUM 9.1 02/22/2017 1423   GFRNONAA >60 04/17/2015 0937   GFRAA >60 04/17/2015 0937    Lipid Panel     Component Value Date/Time   CHOL 162 02/22/2017 1423   TRIG 74.0 02/22/2017 1423   HDL 50.80 02/22/2017 1423   CHOLHDL 3 02/22/2017 1423   VLDL 14.8 02/22/2017 1423   LDLCALC 96 02/22/2017 1423    CBC    Component Value Date/Time   WBC 5.9 02/22/2017 1423   RBC 6.11 (H) 02/22/2017 1423   HGB 14.0 02/22/2017 1423   HCT 43.7 02/22/2017 1423   PLT 171.0 02/22/2017 1423   MCV 71.5 (L) 02/22/2017 1423   MCH 23.0 (L) 04/17/2015 0937   MCHC 32.0 02/22/2017 1423   RDW 14.6 02/22/2017 1423   LYMPHSABS 2.2 04/17/2015 0937   MONOABS 0.3 04/17/2015 0937   EOSABS 0.1 04/17/2015 0937   BASOSABS 0.0 04/17/2015 0937    Hgb A1C Lab Results  Component Value Date   HGBA1C 5.4 02/22/2017           Assessment & Plan:   Peripheral Edema:  Encouraged elevation and exercise for weight loss Discussed low sodium diet Will trial  HCTZ 25 mg daily If you experience muscle cramping, RTC to check potassium levels  Make an appt for your annual exam Webb Silversmith, NP

## 2018-04-22 ENCOUNTER — Telehealth: Payer: Self-pay

## 2018-04-22 NOTE — Telephone Encounter (Signed)
Left message for patient to call Valerie back in regards to a referral-Valerie Andrews, RMA   

## 2018-05-01 ENCOUNTER — Encounter (HOSPITAL_COMMUNITY): Payer: Self-pay | Admitting: Emergency Medicine

## 2018-05-01 ENCOUNTER — Emergency Department (HOSPITAL_COMMUNITY)
Admission: EM | Admit: 2018-05-01 | Discharge: 2018-05-01 | Disposition: A | Discharge status: Home / Self Care | Payer: 59 | Attending: Emergency Medicine | Admitting: Emergency Medicine

## 2018-05-01 DIAGNOSIS — Z79899 Other long term (current) drug therapy: Secondary | ICD-10-CM | POA: Diagnosis not present

## 2018-05-01 DIAGNOSIS — R300 Dysuria: Secondary | ICD-10-CM | POA: Diagnosis present

## 2018-05-01 DIAGNOSIS — N3001 Acute cystitis with hematuria: Secondary | ICD-10-CM | POA: Insufficient documentation

## 2018-05-01 LAB — URINALYSIS, ROUTINE W REFLEX MICROSCOPIC
BILIRUBIN URINE: NEGATIVE
Glucose, UA: NEGATIVE mg/dL
Ketones, ur: NEGATIVE mg/dL
Nitrite: POSITIVE — AB
PROTEIN: NEGATIVE mg/dL
Specific Gravity, Urine: 1.023 (ref 1.005–1.030)
WBC, UA: >50 WBC/hpf — ABNORMAL HIGH (ref 0–5)
pH: 5 (ref 5.0–8.0)

## 2018-05-01 LAB — I-STAT BETA HCG BLOOD, ED (MC, WL, AP ONLY)

## 2018-05-01 MED ORDER — CEPHALEXIN 500 MG PO CAPS: 500.0000 mg | ORAL_CAPSULE | Freq: Three times a day (TID) | ORAL | 0 refills | Status: DC

## 2018-05-01 MED ORDER — LIDOCAINE HCL (PF) 1 % IJ SOLN
INTRAMUSCULAR | Status: AC
  Administered 2018-05-01: 5 mL
  Filled 2018-05-01: qty 5

## 2018-05-01 MED ORDER — CEFTRIAXONE SODIUM 1 G IJ SOLR
1.0000 g | Freq: Once | INTRAMUSCULAR | Status: AC
  Administered 2018-05-01: 1 g via INTRAMUSCULAR
  Filled 2018-05-01: qty 10

## 2018-05-01 NOTE — ED Notes (Signed)
Pt unable to void at this time. 

## 2018-05-01 NOTE — ED Provider Notes (Signed)
[facility] EMERGENCY DEPARTMENT Provider Note   CSN: [id number] Arrival date & time: 05/01/18  0008     History   Chief Complaint Chief Complaint  Patient presents with  . Urinary Tract Infection    HPI Valerie Andrews is a 52 y.o. female.  HPI Patient is a 52 year old female who presents to the emergency department complaints of dysuria and hematuria as well as foul-smelling urine over the past 2 days.  She denies nausea vomiting diarrhea.  Denies flank pain.  Denies abdominal pain.  She has had urinary tract infections before and this feels similar.  No fevers or chills.  No other complaints.  Symptoms are mild to moderate in severity.   Past Medical History:  Diagnosis Date  . Arthritis   . Chicken pox   . GERD (gastroesophageal reflux disease)     Patient Active Problem List   Diagnosis Date Noted  . Esophageal reflux 04/25/2015  . Arthritis of both knees 04/25/2015    History reviewed. No pertinent surgical history.   OB History   None      Home Medications    Prior to Admission medications   Medication Sig Start Date End Date Taking? Authorizing Provider  Acetaminophen 500 MG coapsule Take 500 mg by mouth every 4 (four) hours as needed for pain. Reported on 03/25/2016    [provider]  cephALEXin (KEFLEX) 500 MG capsule Take 1 capsule (500 mg total) by mouth 3 (three) times daily. 05/01/18   Jola Schmidt, MD  hydrochlorothiazide (HYDRODIURIL) 25 MG tablet Take 1 tablet (25 mg total) by mouth daily. 04/18/18   Jearld Fenton, NP  naproxen (NAPROSYN) 500 MG tablet Take 1 tablet (500 mg total) by mouth daily. 04/18/18   Jearld Fenton, NP  traMADol (ULTRAM) 50 MG tablet Take 1 tablet (50 mg total) by mouth every 12 (twelve) hours as needed. 04/18/18   Jearld Fenton, NP    Family History Family History  Problem Relation Age of Onset  . Hypertension Mother   . Stroke Father   . Diabetes Paternal Grandmother   . Cancer Neg Hx     . Breast cancer Neg Hx     Social History Social History   Tobacco Use  . Smoking status: Never Smoker  . Smokeless tobacco: Never Used  Substance Use Topics  . Alcohol use: No    Alcohol/week: 0.0 oz  . Drug use: No     Allergies   Methocarbamol   Review of Systems Review of Systems  All other systems reviewed and are negative.    Physical Exam Updated Vital Signs BP 113/62   Pulse 77   Temp 97.6 F (36.4 C) (Oral)   Resp 15   Ht 5\' 7"  (1.702 m)   Wt 127 kg (280 lb)   LMP 04/07/2018 (Exact Date)   SpO2 99%   BMI 43.85 kg/m   Physical Exam  Constitutional: She is oriented to person, place, and time. She appears well-developed and well-nourished.  HENT:  Head: Normocephalic.  Eyes: EOM are normal.  Neck: Normal range of motion.  Cardiovascular: Normal rate.  Pulmonary/Chest: Effort normal and breath sounds normal.  Abdominal: Soft. She exhibits no distension. There is no tenderness.  Musculoskeletal: Normal range of motion.  Neurological: She is alert and oriented to person, place, and time.  Psychiatric: She has a normal mood and affect.  Nursing note and vitals reviewed.    ED Treatments / Results  Labs (all  labs ordered are listed, but only abnormal results are displayed) Labs Reviewed  URINALYSIS, ROUTINE W REFLEX MICROSCOPIC - Abnormal; Notable for the following components:      Result Value   APPearance HAZY (*)    Hgb urine dipstick SMALL (*)    Nitrite POSITIVE (*)    Leukocytes, UA LARGE (*)    WBC, UA >50 (*)    Bacteria, UA MANY (*)    All other components within normal limits  URINE CULTURE  I-STAT BETA HCG BLOOD, ED ([facility], [facility], [facility] ONLY)    EKG None  Radiology No results found.  Procedures Procedures (including critical care time)  Medications Ordered in ED Medications  cefTRIAXone (ROCEPHIN) injection 1 g (1 g Intramuscular Given 05/01/18 0258)  lidocaine (PF) (XYLOCAINE) 1 % injection (5 mLs  Given 05/01/18 0258)      Initial Impression / Assessment and Plan / ED Course  I have reviewed the triage vital signs and the nursing notes.  Pertinent labs & imaging results that were available during my care of the patient were reviewed by me and considered in my medical decision making (see chart for details).     Acute cystitis.  Patient given Rocephin here in the emergency department.  Discharged home with Keflex.  Urine culture sent.  Overall well-appearing.  Nontoxic.  Patient understands return to the emergency department for new or worsening symptoms  Final Clinical Impressions(s) / ED Diagnoses   Final diagnoses:  Acute cystitis with hematuria    ED Discharge Orders        Ordered    cephALEXin (KEFLEX) 500 MG capsule  3 times daily     05/01/18 0335       Jola Schmidt, MD 05/01/18 [id number]

## 2018-05-01 NOTE — ED Triage Notes (Signed)
Pt c/o dysuria, hematuria, malodorous urine x 2 days. Denies fever, denies n/v/d.

## 2018-05-03 LAB — URINE CULTURE

## 2018-05-04 ENCOUNTER — Telehealth: Payer: Self-pay | Admitting: Emergency Medicine

## 2018-05-04 NOTE — Telephone Encounter (Signed)
Post ED Visit - Positive Culture Follow-up  Culture report reviewed by antimicrobial stewardship pharmacist:  []  Elenor Quinones, Pharm.D. []  Heide Guile, Pharm.D., BCPS AQ-ID []  Parks Neptune, Pharm.D., BCPS []  Alycia Rossetti, Pharm.D., BCPS []  [place], Pharm.D., BCPS, AAHIVP []  Legrand Como, Pharm.D., BCPS, AAHIVP []  Salome Arnt, PharmD, BCPS []  Wynell Balloon, PharmD []  Vincenza Hews, PharmD, BCPS [facility] PharmD  Positive urine culture Treated with cephalexin, organism sensitive to the same and no further patient follow-up is required at this time.  Hazle Nordmann 05/04/2018, 10:09 AM

## 2018-05-05 DIAGNOSIS — M25561 Pain in right knee: Secondary | ICD-10-CM | POA: Diagnosis not present

## 2018-05-05 DIAGNOSIS — M25562 Pain in left knee: Secondary | ICD-10-CM | POA: Diagnosis not present

## 2018-05-06 ENCOUNTER — Encounter (HOSPITAL_COMMUNITY): Payer: Self-pay | Admitting: Emergency Medicine

## 2018-05-06 ENCOUNTER — Emergency Department (HOSPITAL_COMMUNITY)
Admission: EM | Admit: 2018-05-06 | Discharge: 2018-05-06 | Disposition: A | Discharge status: Home / Self Care | Payer: 59 | Attending: Emergency Medicine | Admitting: Emergency Medicine

## 2018-05-06 ENCOUNTER — Other Ambulatory Visit: Payer: Self-pay

## 2018-05-06 DIAGNOSIS — R3129 Other microscopic hematuria: Secondary | ICD-10-CM

## 2018-05-06 DIAGNOSIS — Z79899 Other long term (current) drug therapy: Secondary | ICD-10-CM | POA: Insufficient documentation

## 2018-05-06 DIAGNOSIS — N760 Acute vaginitis: Secondary | ICD-10-CM | POA: Insufficient documentation

## 2018-05-06 DIAGNOSIS — B9689 Other specified bacterial agents as the cause of diseases classified elsewhere: Secondary | ICD-10-CM | POA: Diagnosis not present

## 2018-05-06 DIAGNOSIS — R350 Frequency of micturition: Secondary | ICD-10-CM | POA: Diagnosis present

## 2018-05-06 DIAGNOSIS — R3121 Asymptomatic microscopic hematuria: Secondary | ICD-10-CM | POA: Diagnosis not present

## 2018-05-06 LAB — URINALYSIS, ROUTINE W REFLEX MICROSCOPIC
BACTERIA UA: NONE SEEN
BILIRUBIN URINE: NEGATIVE
Glucose, UA: NEGATIVE mg/dL
Ketones, ur: NEGATIVE mg/dL
LEUKOCYTES UA: NEGATIVE
NITRITE: NEGATIVE
PH: 7 (ref 5.0–8.0)
Protein, ur: NEGATIVE mg/dL
SPECIFIC GRAVITY, URINE: 1.019 (ref 1.005–1.030)

## 2018-05-06 LAB — WET PREP, GENITAL
SPERM: NONE SEEN
Trich, Wet Prep: NONE SEEN
Yeast Wet Prep HPF POC: NONE SEEN

## 2018-05-06 LAB — GC/CHLAMYDIA PROBE AMP (~~LOC~~) NOT AT ARMC
CHLAMYDIA, DNA PROBE: NEGATIVE
Neisseria Gonorrhea: NEGATIVE

## 2018-05-06 MED ORDER — METRONIDAZOLE 500 MG PO TABS
500.0000 mg | ORAL_TABLET | Freq: Once | ORAL | Status: AC
  Administered 2018-05-06: 500 mg via ORAL
  Filled 2018-05-06: qty 1

## 2018-05-06 MED ORDER — METRONIDAZOLE 500 MG PO TABS: 500.0000 mg | ORAL_TABLET | Freq: Two times a day (BID) | ORAL | 0 refills | Status: DC

## 2018-05-06 MED ORDER — PHENAZOPYRIDINE HCL 100 MG PO TABS
200.0000 mg | ORAL_TABLET | Freq: Three times a day (TID) | ORAL | Status: DC
  Administered 2018-05-06: 200 mg via ORAL
  Filled 2018-05-06: qty 2

## 2018-05-06 NOTE — ED Provider Notes (Signed)
[facility] DEPARTMENT Provider Note   CSN: [id number] Arrival date & time: 05/06/18  3329     History   Chief Complaint Chief Complaint  Patient presents with  . Urinary Frequency    HPI Valerie Andrews is a 52 y.o. female.  The history is provided by the patient.  Urinary Frequency  This is a recurrent problem. The current episode started 3 to 5 hours ago. The problem occurs constantly. The problem has not changed since onset.Pertinent negatives include no chest pain, no abdominal pain, no headaches and no shortness of breath. Nothing aggravates the symptoms. Nothing relieves the symptoms. Treatments tried: keflex. The treatment provided no relief.    Past Medical History:  Diagnosis Date  . Arthritis   . Chicken pox   . GERD (gastroesophageal reflux disease)     Patient Active Problem List   Diagnosis Date Noted  . Esophageal reflux 04/25/2015  . Arthritis of both knees 04/25/2015    History reviewed. No pertinent surgical history.   OB History   None      Home Medications    Prior to Admission medications   Medication Sig Start Date End Date Taking? Authorizing Provider  Acetaminophen 500 MG coapsule Take 500 mg by mouth every 4 (four) hours as needed for pain. Reported on 03/25/2016    [provider]  cephALEXin (KEFLEX) 500 MG capsule Take 1 capsule (500 mg total) by mouth 3 (three) times daily. 05/01/18   Jola Schmidt, MD  hydrochlorothiazide (HYDRODIURIL) 25 MG tablet Take 1 tablet (25 mg total) by mouth daily. 04/18/18   Jearld Fenton, NP  naproxen (NAPROSYN) 500 MG tablet Take 1 tablet (500 mg total) by mouth daily. 04/18/18   Jearld Fenton, NP  traMADol (ULTRAM) 50 MG tablet Take 1 tablet (50 mg total) by mouth every 12 (twelve) hours as needed. 04/18/18   Jearld Fenton, NP    Family History Family History  Problem Relation Age of Onset  . Hypertension Mother   . Stroke Father   . Diabetes Paternal  Grandmother   . Cancer Neg Hx   . Breast cancer Neg Hx     Social History Social History   Tobacco Use  . Smoking status: Never Smoker  . Smokeless tobacco: Never Used  Substance Use Topics  . Alcohol use: No    Alcohol/week: 0.0 oz  . Drug use: No     Allergies   Methocarbamol   Review of Systems Review of Systems  Respiratory: Negative for shortness of breath.   Cardiovascular: Negative for chest pain.  Gastrointestinal: Negative for abdominal pain, nausea and vomiting.  Genitourinary: Positive for frequency. Negative for decreased urine volume, dysuria, flank pain, menstrual problem, pelvic pain, vaginal bleeding and vaginal pain.  Neurological: Negative for headaches.  All other systems reviewed and are negative.    Physical Exam Updated Vital Signs BP 132/78 (BP Location: Right Arm)   Pulse 72   Temp 98.4 F (36.9 C) (Oral)   Resp 16   Ht 5\' 7"  (1.702 m)   Wt 133.4 kg (294 lb)   LMP 04/07/2018 (Exact Date)   SpO2 98%   BMI 46.05 kg/m   Physical Exam  Constitutional: She is oriented to person, place, and time. She appears well-developed and well-nourished. No distress.  HENT:  Head: Normocephalic and atraumatic.  Mouth/Throat: No oropharyngeal exudate.  Eyes: Conjunctivae and EOM are normal.  Neck: Normal range of motion. Neck supple.  Cardiovascular: Normal  rate, regular rhythm, normal heart sounds and intact distal pulses.  Pulmonary/Chest: Effort normal and breath sounds normal. No respiratory distress.  Abdominal: Soft. Bowel sounds are normal. She exhibits no distension and no mass. There is no tenderness. There is no rebound and no guarding.  Genitourinary: Vaginal discharge found.  Genitourinary Comments: Chaperone present scant white discharge.  No CMT or adnexal tenderness  Musculoskeletal: Normal range of motion.  Neurological: She is alert and oriented to person, place, and time.  Skin: Skin is warm and dry. Capillary refill takes less than  2 seconds.  Psychiatric: She has a normal mood and affect.     ED Treatments / Results  Labs (all labs ordered are listed, but only abnormal results are displayed) Results for orders placed or performed during the hospital encounter of 05/06/18  Urinalysis, Routine w reflex microscopic  Result Value Ref Range   Color, Urine YELLOW YELLOW   APPearance CLEAR CLEAR   Specific Gravity, Urine 1.019 1.005 - 1.030   pH 7.0 5.0 - 8.0   Glucose, UA NEGATIVE NEGATIVE mg/dL   Hgb urine dipstick SMALL (A) NEGATIVE   Bilirubin Urine NEGATIVE NEGATIVE   Ketones, ur NEGATIVE NEGATIVE mg/dL   Protein, ur NEGATIVE NEGATIVE mg/dL   Nitrite NEGATIVE NEGATIVE   Leukocytes, UA NEGATIVE NEGATIVE   RBC / HPF 6-10 0 - 5 RBC/hpf   WBC, UA 0-5 0 - 5 WBC/hpf   Bacteria, UA NONE SEEN NONE SEEN   Squamous Epithelial / LPF 6-10 0 - 5   Mucus PRESENT    No results found.  EKG None  Radiology No results found.  Procedures Procedures (including critical care time)  Medications Ordered in ED Medications  phenazopyridine (PYRIDIUM) tablet 200 mg (has no administration in time range)      Final Clinical Impressions(s) / ED Diagnoses   Will start pyridium.  Follow up with your GYN for ongoing care of frequency.  Follow up with urology for microscopic hematuria.    Return for leg or calf swelling or pain, numbness, changes in vision or speech, fevers >100.4 unrelieved by medication, shortness of breath, intractable vomiting, or diarrhea, abdominal pain, Inability to tolerate liquids or food, cough, altered mental status or any concerns. No signs of systemic illness or infection. The patient is nontoxic-appearing on exam and vital signs are within normal limits. Will refer to urology for microscopy hematuria as patient is asymptomatic.  I have reviewed the triage vital signs and the nursing notes. Pertinent labs &imaging results that were available during my care of the patient were reviewed by  me and considered in my medical decision making (see chart for details).  After history, exam, and medical workup I feel the patient has been appropriately medically screened and is safe for discharge home. Pertinent diagnoses were discussed with the patient. Patient was given return precautions.   Lavene Penagos, MD 05/06/18 0981

## 2018-05-06 NOTE — ED Notes (Signed)
Pt reports urinary frequency all night. Pt was seen here Sunday for same, dx with UTI and given PO Keflex and IM Rocephin

## 2018-05-08 LAB — URINE CULTURE

## 2018-05-09 ENCOUNTER — Telehealth: Payer: Self-pay | Admitting: Emergency Medicine

## 2018-05-09 NOTE — Telephone Encounter (Signed)
Post ED Visit - Positive Culture Follow-up  Culture report reviewed by antimicrobial stewardship pharmacist:  []  Elenor Quinones, Pharm.D. []  Heide Guile, Pharm.D., BCPS AQ-ID []  Parks Neptune, Pharm.D., BCPS []  Alycia Rossetti, Pharm.D., BCPS []  [place], Pharm.D., BCPS, AAHIVP []  Legrand Como, Pharm.D., BCPS, AAHIVP []  Salome Arnt, PharmD, BCPS []  Wynell Balloon, PharmD []  Vincenza Hews, PharmD, BCPS   Positive urine culture Treated with metronidazole , symptom check, if symptomatic, start macrobid 100mg  po bid x 7 days   Attempting to contact patient   Hazle Nordmann 05/09/2018, 11:43 AM

## 2018-05-09 NOTE — Progress Notes (Signed)
ED Antimicrobial Stewardship Positive Culture Follow Up   Valerie Andrews is an 52 y.o. female who presented to [facility] on 05/06/2018 with a chief complaint of  Chief Complaint  Patient presents with  . Urinary Frequency    Recent Results (from the past 720 hour(s))  Urine C&S     Status: Abnormal   Collection Time: 05/01/18  2:07 AM  Result Value Ref Range Status   Specimen Description URINE, CLEAN CATCH  Final   Special Requests   Final    NONE Performed at [facility], [address] [place], [place] [address]    Culture >=100,000 COLONIES/mL ESCHERICHIA COLI (A)  Final   Report Status 05/03/2018 FINAL  Final   Organism ID, Bacteria ESCHERICHIA COLI (A)  Final      Susceptibility   Escherichia coli - MIC*    AMPICILLIN >=32 RESISTANT Resistant     CEFAZOLIN <=4 SENSITIVE Sensitive     CEFTRIAXONE <=1 SENSITIVE Sensitive     CIPROFLOXACIN >=4 RESISTANT Resistant     GENTAMICIN >=16 RESISTANT Resistant     IMIPENEM <=0.25 SENSITIVE Sensitive     NITROFURANTOIN <=16 SENSITIVE Sensitive     TRIMETH/SULFA >=320 RESISTANT Resistant     AMPICILLIN/SULBACTAM >=32 RESISTANT Resistant     PIP/TAZO 8 SENSITIVE Sensitive     Extended ESBL NEGATIVE Sensitive     * >=100,000 COLONIES/mL ESCHERICHIA COLI  Urine culture     Status: Abnormal   Collection Time: 05/06/18  4:37 AM  Result Value Ref Range Status   Specimen Description URINE, RANDOM  Final   Special Requests NONE  Final   Culture (A)  Final    10,000 COLONIES/mL ESCHERICHIA COLI Confirmed Extended Spectrum Beta-Lactamase Producer (ESBL).  In bloodstream infections from ESBL organisms, carbapenems are preferred over piperacillin/tazobactam. They are shown to have a lower risk of mortality. Performed at [facility], [place] [address] [place], [place] [address]    Report Status 05/08/2018 FINAL  Final   Organism ID, Bacteria ESCHERICHIA COLI (A)  Final      Susceptibility   Escherichia coli - MIC*   AMPICILLIN >=32 RESISTANT Resistant     CEFAZOLIN >=64 RESISTANT Resistant     CEFTRIAXONE >=64 RESISTANT Resistant     CIPROFLOXACIN >=4 RESISTANT Resistant     GENTAMICIN >=16 RESISTANT Resistant     IMIPENEM <=0.25 SENSITIVE Sensitive     NITROFURANTOIN <=16 SENSITIVE Sensitive     TRIMETH/SULFA >=320 RESISTANT Resistant     AMPICILLIN/SULBACTAM >=32 RESISTANT Resistant     PIP/TAZO 8 SENSITIVE Sensitive     Extended ESBL POSITIVE Resistant     * 10,000 COLONIES/mL ESCHERICHIA COLI  Wet prep, genital     Status: Abnormal   Collection Time: 05/06/18  5:10 AM  Result Value Ref Range Status   Yeast Wet Prep HPF POC NONE SEEN NONE SEEN Final   Trich, Wet Prep NONE SEEN NONE SEEN Final   Clue Cells Wet Prep HPF POC PRESENT (A) NONE SEEN Final   WBC, Wet Prep HPF POC FEW (A) NONE SEEN Final   Sperm NONE SEEN  Final    Comment: Performed at [facility], [place] [address] [place], [place] [address]    [x]  Treated with cephalexin and metronidazole, organism resistant to prescribed antimicrobial []  Patient discharged originally without antimicrobial agent and treatment is now indicated  New antibiotic prescription: If symptomatic, start macrobid 100mg  PO BID x 7 days  ED Provider: Raquel Sarna  Luanna Cole, PA  Kaitlan Bin, Rande Lawman 05/09/2018, 9:07 AM Clinical Pharmacist Phone# [phone]

## 2018-05-14 ENCOUNTER — Telehealth: Payer: Self-pay

## 2018-05-14 NOTE — Telephone Encounter (Signed)
Called for symptom check. Pt states no further problems.

## 2018-05-17 ENCOUNTER — Inpatient Hospital Stay: Payer: 59 | Admitting: Internal Medicine

## 2018-06-02 ENCOUNTER — Other Ambulatory Visit: Payer: Self-pay | Admitting: Internal Medicine

## 2018-06-02 NOTE — Telephone Encounter (Signed)
Copied from [place] [id number]. Topic: Quick Communication - See Telephone Encounter >> Jun 02, 2018  2:08 PM Ivar Drape wrote: CRM for notification. See Telephone encounter for: 06/02/18. Patient would like a refill on her naproxen (NAPROSYN) 500 MG tablet medication and have it sent to her preferred pharmacy [facility].  Patient also stated the dosage should be 2 times a day, instead of one time a day.

## 2018-06-03 MED ORDER — NAPROXEN 500 MG PO TABS: 500.0000 mg | ORAL_TABLET | Freq: Every day | ORAL | 5 refills | Status: DC

## 2018-06-03 NOTE — Telephone Encounter (Signed)
Naprosyn  LRF 04/18/18  #30  5 refills  LOV 04/18/18 R. [place].    Patient states the dosage should be 2 times a day, instead of one time a day.

## 2018-07-04 ENCOUNTER — Encounter: Payer: Self-pay | Admitting: Internal Medicine

## 2018-08-09 ENCOUNTER — Encounter: Payer: 59 | Admitting: Internal Medicine

## 2018-08-09 DIAGNOSIS — Z0289 Encounter for other administrative examinations: Secondary | ICD-10-CM

## 2018-08-25 ENCOUNTER — Encounter: Payer: Self-pay | Admitting: Internal Medicine

## 2018-08-25 ENCOUNTER — Ambulatory Visit: Payer: 59 | Admitting: Internal Medicine

## 2018-08-25 VITALS — BP 116/74 | HR 83 | Temp 97.9°F | Wt 277.0 lb

## 2018-08-25 DIAGNOSIS — R8290 Unspecified abnormal findings in urine: Secondary | ICD-10-CM

## 2018-08-25 DIAGNOSIS — R822 Biliuria: Secondary | ICD-10-CM

## 2018-08-25 DIAGNOSIS — R350 Frequency of micturition: Secondary | ICD-10-CM

## 2018-08-25 LAB — POC URINALSYSI DIPSTICK (AUTOMATED)
GLUCOSE UA: NEGATIVE
Leukocytes, UA: NEGATIVE
Nitrite, UA: NEGATIVE
Protein, UA: POSITIVE — AB
RBC UA: NEGATIVE
UROBILINOGEN UA: 1 U/dL
pH, UA: 6 (ref 5.0–8.0)

## 2018-08-25 LAB — COMPREHENSIVE METABOLIC PANEL
ALT: 10 U/L (ref 0–35)
AST: 10 U/L (ref 0–37)
Albumin: 3.7 g/dL (ref 3.5–5.2)
Alkaline Phosphatase: 65 U/L (ref 39–117)
BILIRUBIN TOTAL: 0.7 mg/dL (ref 0.2–1.2)
BUN: 18 mg/dL (ref 6–23)
CALCIUM: 9 mg/dL (ref 8.4–10.5)
CO2: 28 meq/L (ref 19–32)
CREATININE: 1.09 mg/dL (ref 0.40–1.20)
Chloride: 103 mEq/L (ref 96–112)
GFR: 68.23 mL/min (ref >60.00)
GLUCOSE: 99 mg/dL (ref 70–99)
Potassium: 4.1 mEq/L (ref 3.5–5.1)
SODIUM: 137 meq/L (ref 135–145)
Total Protein: 6.5 g/dL (ref 6.0–8.3)

## 2018-08-25 LAB — CBC
HCT: 42.5 % (ref 36.0–46.0)
Hemoglobin: 13.4 g/dL (ref 12.0–15.0)
MCHC: 31.6 g/dL (ref 30.0–36.0)
MCV: 71.4 fl — ABNORMAL LOW (ref 78.0–100.0)
PLATELETS: 185 10*3/uL (ref 150.0–400.0)
RBC: 5.95 Mil/uL — ABNORMAL HIGH (ref 3.87–5.11)
RDW: 14.7 % (ref 11.5–15.5)
WBC: 4.8 10*3/uL (ref 4.0–10.5)

## 2018-08-25 NOTE — Progress Notes (Signed)
HPI  Pt presents to the clinic today with c/o urinary frequency and odor. She reports this started 1 week ago. She denies urgency, dysuria or blood in her urine. She denies pelvic pain or low back pain. She denies vaginal discharge, itching, irritation, odor or abnormal bleeding. She denies fever, chills or nausea. She has not take anything OTC for her symptoms.   Review of Systems  Past Medical History:  Diagnosis Date  . Arthritis   . Chicken pox   . GERD (gastroesophageal reflux disease)     Family History  Problem Relation Age of Onset  . Hypertension Mother   . Stroke Father   . Diabetes Paternal Grandmother   . Cancer Neg Hx   . Breast cancer Neg Hx     Social History   Socioeconomic History  . Marital status: Single    Spouse name: Not on file  . Number of children: Not on file  . Years of education: Not on file  . Highest education level: Not on file  Occupational History  . Not on file  Social Needs  . Financial resource strain: Not on file  . Food insecurity:    Worry: Not on file    Inability: Not on file  . Transportation needs:    Medical: Not on file    Non-medical: Not on file  Tobacco Use  . Smoking status: Never Smoker  . Smokeless tobacco: Never Used  Substance and Sexual Activity  . Alcohol use: No    Alcohol/week: 0.0 standard drinks  . Drug use: No  . Sexual activity: Not Currently  Lifestyle  . Physical activity:    Days per week: Not on file    Minutes per session: Not on file  . Stress: Not on file  Relationships  . Social connections:    Talks on phone: Not on file    Gets together: Not on file    Attends religious service: Not on file    Active member of club or organization: Not on file    Attends meetings of clubs or organizations: Not on file    Relationship status: Not on file  . Intimate partner violence:    Fear of current or ex partner: Not on file    Emotionally abused: Not on file    Physically abused: Not on file   Forced sexual activity: Not on file  Other Topics Concern  . Not on file  Social History Narrative  . Not on file    Allergies  Allergen Reactions  . Methocarbamol Other (See Comments)    headache     Constitutional: Denies fever, malaise, fatigue, headache or abrupt weight changes.   GU: Pt reports frequency and odor. Denies urgency, dysuria, burning sensation, blood in urine, or discharge. Skin: Denies redness, rashes, lesions or ulcercations.   No other specific complaints in a complete review of systems (except as listed in HPI above).    Objective:   Physical Exam  BP 116/74   Pulse 83   Temp 97.9 F (36.6 C) (Oral)   Wt 277 lb (125.6 kg)   LMP 08/11/2018   SpO2 98%   BMI 43.38 kg/m   Wt Readings from Last 3 Encounters:  05/06/18 294 lb (133.4 kg)  05/01/18 280 lb (127 kg)  04/18/18 288 lb 4 oz (130.7 kg)    General: Appears her stated age, obese, in NAD. Cardiovascular: Normal rate and rhythm. S1,S2 noted.   Pulmonary/Chest: Normal effort and positive vesicular breath  sounds. No respiratory distress. No wheezes, rales or ronchi noted.  Abdomen: Soft, nontender. Normal bowel sounds. No distention or masses noted.  No CVA tenderness.        Assessment & Plan:   Frequency, Urine Odor:  Urinalysis: positive protein, positive urobilinogen Will send urine culture Will check CBC and CMET today Drink plenty of fluids  RTC as needed or if symptoms persist. Webb Silversmith, NP

## 2018-08-26 ENCOUNTER — Encounter: Payer: Self-pay | Admitting: Internal Medicine

## 2018-08-26 NOTE — Patient Instructions (Signed)

## 2018-08-27 LAB — URINE CULTURE
MICRO NUMBER:: 91249766
SPECIMEN QUALITY:: ADEQUATE

## 2018-08-31 NOTE — Addendum Note (Signed)
Addended by: Lurlean Nanny on: 08/31/2018 11:45 AM   Modules accepted: Orders

## 2018-09-02 ENCOUNTER — Other Ambulatory Visit: Payer: 59

## 2018-09-04 DIAGNOSIS — L539 Erythematous condition, unspecified: Secondary | ICD-10-CM | POA: Diagnosis not present

## 2018-09-04 DIAGNOSIS — M79674 Pain in right toe(s): Secondary | ICD-10-CM | POA: Diagnosis not present

## 2018-09-04 DIAGNOSIS — L03031 Cellulitis of right toe: Secondary | ICD-10-CM | POA: Diagnosis not present

## 2018-09-05 DIAGNOSIS — M79674 Pain in right toe(s): Secondary | ICD-10-CM | POA: Diagnosis not present

## 2018-09-09 ENCOUNTER — Other Ambulatory Visit: Payer: 59

## 2018-09-09 ENCOUNTER — Telehealth: Payer: Self-pay | Admitting: Internal Medicine

## 2018-09-09 NOTE — Telephone Encounter (Signed)
Melanie Please call pt  She stated that someone called her and made and appointment for UA and CPX with [facility] today @ 7:45.  I r/s the 10/25 lab appointment to today.  But pt stated she had a 7:45 appointment today with Rollene Fare.  I tried to explain to her that Rollene Fare was not in the office till after lunch.  But pt insisted that she had an appointment today. Marland Kitchen  She wanted to wait to talk to you.  I told her you wouldn't be in till 8 and you would have to take the patients back before coming out to talk to you.   Pt was a little upset  Best number [phone]

## 2018-09-09 NOTE — Telephone Encounter (Signed)
Left detailed msg on VM per HIPAA  Pt had CPE scheduled for 08/09/2018 that she no showed and I scheduled a repeat urinalysis lab appt which she never showed up for. Webb Silversmith never works Friday AM so she would have never been able to have an 8am appt for CPE on a Fri

## 2018-09-13 NOTE — Telephone Encounter (Signed)
I spoke with pt and pt wants to discuss with Threasa Beards the appt that was supposed to be scheduled on 09/09/18.

## 2018-09-13 NOTE — Telephone Encounter (Signed)
I have scheduled a lab appt for 09/14/2018 for recheck UA and pt is aware that she needs to schedule CPE

## 2018-09-14 ENCOUNTER — Other Ambulatory Visit: Payer: 59

## 2018-09-15 DIAGNOSIS — L03031 Cellulitis of right toe: Secondary | ICD-10-CM | POA: Diagnosis not present

## 2018-12-08 ENCOUNTER — Telehealth: Payer: Self-pay | Admitting: Internal Medicine

## 2018-12-08 NOTE — Telephone Encounter (Signed)
Patient scheduled cpx on 01/24/19. Patient will go to the health department for her tb test.

## 2018-12-08 NOTE — Telephone Encounter (Signed)
Patient needs to get a tb test done for work.  Patient works at [employer] at [employer].  Does patient just need to schedule a nurse visit or see Rollene Fare?  Patient said she doesn't know of any form she needs filled out.

## 2018-12-08 NOTE — Telephone Encounter (Signed)
Pt is overdue for CPE, pt has canceled CPE 2 times, pt needs to get this scheduled

## 2018-12-18 DIAGNOSIS — J209 Acute bronchitis, unspecified: Secondary | ICD-10-CM | POA: Diagnosis not present

## 2019-01-24 ENCOUNTER — Encounter: Payer: 59 | Admitting: Internal Medicine

## 2019-01-24 NOTE — Telephone Encounter (Signed)
She needs to be dismissed due to multiple no shows.

## 2019-01-24 NOTE — Progress Notes (Deleted)
Subjective:    Patient ID: Valerie Andrews, female    DOB: November 04, 1966, 53 y.o.   MRN: [id number]  HPI  Pt presents to the clinic today for her annual exam. She is also due to follow up chronic conditions.  GERD: Triggered by. She takes as needed with good relief. There is no upper GI on file.  Osteoarthritis: Mainly in her knees. She takes Naproxen and Tramadol as prescribed. She is not following with ortho.  Flu:  Tetanus: 09/2008 Pap Smear: Mammogram: 08/2015 Colon Screening: never Vision Screening: Dentist:  Diet: Exercise:   Review of Systems      Past Medical History:  Diagnosis Date  . Arthritis   . Chicken pox   . GERD (gastroesophageal reflux disease)     Current Outpatient Medications  Medication Sig Dispense Refill  . Acetaminophen 500 MG coapsule Take 500 mg by mouth every 4 (four) hours as needed for pain. Reported on 03/25/2016    . hydrochlorothiazide (HYDRODIURIL) 25 MG tablet Take 1 tablet (25 mg total) by mouth daily. 30 tablet 2  . naproxen (NAPROSYN) 500 MG tablet Take 1 tablet (500 mg total) by mouth daily. 30 tablet 5  . traMADol (ULTRAM) 50 MG tablet Take 1 tablet (50 mg total) by mouth every 12 (twelve) hours as needed. 30 tablet 0   Current Facility-Administered Medications  Medication Dose Route Frequency Provider Last Rate Last Dose  . dexamethasone (DECADRON) injection 10 mg  10 mg Intramuscular Once Versie Starks, PA-C        Allergies  Allergen Reactions  . Methocarbamol Other (See Comments)    headache    Family History  Problem Relation Age of Onset  . Hypertension Mother   . Stroke Father   . Diabetes Paternal Grandmother   . Cancer Neg Hx   . Breast cancer Neg Hx     Social History   Socioeconomic History  . Marital status: Single    Spouse name: Not on file  . Number of children: Not on file  . Years of education: Not on file  . Highest education level: Not on file  Occupational History  . Not on file  Social  Needs  . Financial resource strain: Not on file  . Food insecurity:    Worry: Not on file    Inability: Not on file  . Transportation needs:    Medical: Not on file    Non-medical: Not on file  Tobacco Use  . Smoking status: Never Smoker  . Smokeless tobacco: Never Used  Substance and Sexual Activity  . Alcohol use: No    Alcohol/week: 0.0 standard drinks  . Drug use: No  . Sexual activity: Not Currently  Lifestyle  . Physical activity:    Days per week: Not on file    Minutes per session: Not on file  . Stress: Not on file  Relationships  . Social connections:    Talks on phone: Not on file    Gets together: Not on file    Attends religious service: Not on file    Active member of club or organization: Not on file    Attends meetings of clubs or organizations: Not on file    Relationship status: Not on file  . Intimate partner violence:    Fear of current or ex partner: Not on file    Emotionally abused: Not on file    Physically abused: Not on file    Forced sexual activity: Not on  file  Other Topics Concern  . Not on file  Social History Narrative  . Not on file     Constitutional: Denies fever, malaise, fatigue, headache or abrupt weight changes.  HEENT: Denies eye pain, eye redness, ear pain, ringing in the ears, wax buildup, runny nose, nasal congestion, bloody nose, or sore throat. Respiratory: Denies difficulty breathing, shortness of breath, cough or sputum production.   Cardiovascular: Denies chest pain, chest tightness, palpitations or swelling in the hands or feet.  Gastrointestinal: Denies abdominal pain, bloating, constipation, diarrhea or blood in the stool.  GU: Denies urgency, frequency, pain with urination, burning sensation, blood in urine, odor or discharge. Musculoskeletal: Denies decrease in range of motion, difficulty with gait, muscle pain or joint pain and swelling.  Skin: Denies redness, rashes, lesions or ulcercations.  Neurological: Denies  dizziness, difficulty with memory, difficulty with speech or problems with balance and coordination.  Psych: Denies anxiety, depression, SI/HI.  No other specific complaints in a complete review of systems (except as listed in HPI above).  Objective:   Physical Exam        Assessment & Plan:

## 2019-01-24 NOTE — Telephone Encounter (Signed)
Pt has no showed her CPE for today as well, FYI

## 2019-01-27 ENCOUNTER — Encounter: Payer: Self-pay | Admitting: Internal Medicine

## 2019-01-30 ENCOUNTER — Telehealth: Payer: Self-pay | Admitting: Internal Medicine

## 2019-01-30 NOTE — Telephone Encounter (Signed)
Patient dismissed from [facility] at [facility] by Webb Silversmith NP, effective 01/27/19. Dismissal Letter sent out by 1st class mail. [employer]

## 2019-02-09 DIAGNOSIS — J029 Acute pharyngitis, unspecified: Secondary | ICD-10-CM | POA: Diagnosis not present

## 2019-03-07 ENCOUNTER — Telehealth: Payer: Self-pay | Admitting: General Practice

## 2019-03-07 ENCOUNTER — Ambulatory Visit: Payer: 59 | Admitting: Internal Medicine

## 2019-03-07 NOTE — Telephone Encounter (Signed)
Called and left message on pts voicemail (OK per DPR to leave detailed message on cell).  Advised that appt today needs to be cancelled since she was dismissed from the practice and all [facility] practices.  Letter was mailed out on 01/27/19.

## 2019-05-03 ENCOUNTER — Encounter: Payer: Self-pay | Admitting: Internal Medicine

## 2019-05-18 DIAGNOSIS — M2041 Other hammer toe(s) (acquired), right foot: Secondary | ICD-10-CM | POA: Diagnosis not present

## 2019-05-18 DIAGNOSIS — M2042 Other hammer toe(s) (acquired), left foot: Secondary | ICD-10-CM | POA: Diagnosis not present

## 2019-05-18 DIAGNOSIS — D485 Neoplasm of uncertain behavior of skin: Secondary | ICD-10-CM | POA: Diagnosis not present

## 2019-06-29 ENCOUNTER — Other Ambulatory Visit: Payer: Self-pay

## 2019-06-29 DIAGNOSIS — Z20822 Contact with and (suspected) exposure to covid-19: Secondary | ICD-10-CM

## 2019-06-30 LAB — NOVEL CORONAVIRUS, NAA: SARS-CoV-2, NAA: NOT DETECTED

## 2019-07-04 ENCOUNTER — Telehealth: Payer: Self-pay | Admitting: General Practice

## 2019-07-04 NOTE — Telephone Encounter (Signed)
Negative COVID results given. Patient results "NOT Detected." Caller expressed understanding. ° °

## 2019-09-04 ENCOUNTER — Ambulatory Visit (HOSPITAL_COMMUNITY)
Admission: EM | Admit: 2019-09-04 | Discharge: 2019-09-04 | Disposition: A | Discharge status: Home / Self Care | Payer: 59 | Attending: Family Medicine | Admitting: Family Medicine

## 2019-09-04 ENCOUNTER — Other Ambulatory Visit: Payer: Self-pay

## 2019-09-04 ENCOUNTER — Encounter (HOSPITAL_COMMUNITY): Payer: Self-pay

## 2019-09-04 DIAGNOSIS — R350 Frequency of micturition: Secondary | ICD-10-CM | POA: Insufficient documentation

## 2019-09-04 LAB — POCT URINALYSIS DIP (DEVICE)
Bilirubin Urine: NEGATIVE
Glucose, UA: NEGATIVE mg/dL
Ketones, ur: NEGATIVE mg/dL
Nitrite: NEGATIVE
Protein, ur: NEGATIVE mg/dL
Specific Gravity, Urine: 1.025 (ref 1.005–1.030)
Urobilinogen, UA: 0.2 mg/dL (ref 0.0–1.0)
pH: 7 (ref 5.0–8.0)

## 2019-09-04 MED ORDER — CEPHALEXIN 500 MG PO CAPS: 500.0000 mg | ORAL_CAPSULE | Freq: Two times a day (BID) | ORAL | 0 refills | Status: AC

## 2019-09-04 NOTE — ED Triage Notes (Signed)
Pt presents with urinary tract symptoms; frequent urination X 5 days.

## 2019-09-04 NOTE — ED Provider Notes (Signed)
[facility]    CSN: [id number] Arrival date & time: 09/04/19  1059      History   Chief Complaint Chief Complaint  Patient presents with  . Urinary Tract Infection    HPI Valerie Andrews is a 53 y.o. female.   Patient is a 53 year old female with past medical history of arthritis, chickenpox, GERD.  She presents today with urinary frequency x5 days.  Symptoms have been constant, waxing and waning.  Denies any dysuria, hematuria.  Mild suprapubic pressure.  She is not taking thing for symptoms.  Denies increased thirst.  Patient is not a diabetic.  No vaginal discharge, itching or irritation.  No flank pain or fever.  ROS per HPI      Past Medical History:  Diagnosis Date  . Arthritis   . Chicken pox   . GERD (gastroesophageal reflux disease)     Patient Active Problem List   Diagnosis Date Noted  . Esophageal reflux 04/25/2015  . Arthritis of both knees 04/25/2015    History reviewed. No pertinent surgical history.  OB History   No obstetric history on file.      Home Medications    Prior to Admission medications   Medication Sig Start Date End Date Taking? Authorizing Provider  cephALEXin (KEFLEX) 500 MG capsule Take 1 capsule (500 mg total) by mouth 2 (two) times daily for 5 days. 09/04/19 09/09/19  Loura Halt A, NP  hydrochlorothiazide (HYDRODIURIL) 25 MG tablet Take 1 tablet (25 mg total) by mouth daily. 04/18/18   Jearld Fenton, NP    Family History Family History  Problem Relation Age of Onset  . Hypertension Mother   . Stroke Father   . Diabetes Paternal Grandmother   . Cancer Neg Hx   . Breast cancer Neg Hx     Social History Social History   Tobacco Use  . Smoking status: Never Smoker  . Smokeless tobacco: Never Used  Substance Use Topics  . Alcohol use: No    Alcohol/week: 0.0 standard drinks  . Drug use: No     Allergies   Methocarbamol   Review of Systems Review of Systems   Physical Exam Triage Vital  Signs ED Triage Vitals  Enc Vitals Group     BP 09/04/19 1201 139/87     Pulse Rate 09/04/19 1201 (!) 102     Resp 09/04/19 1201 18     Temp 09/04/19 1201 98.5 F (36.9 C)     Temp Source 09/04/19 1201 Oral     SpO2 09/04/19 1201 100 %     Weight --      Height --      Head Circumference --      Peak Flow --      Pain Score 09/04/19 1203 4     Pain Loc --      Pain Edu? --      Excl. in [place]? --    No data found.  Updated Vital Signs BP 139/87 (BP Location: Right Arm)   Pulse (!) 102   Temp 98.5 F (36.9 C) (Oral)   Resp 18   LMP 08/15/2019   SpO2 100%   Visual Acuity Right Eye Distance:   Left Eye Distance:   Bilateral Distance:    Right Eye Near:   Left Eye Near:    Bilateral Near:     Physical Exam Vitals signs and nursing note reviewed.  Constitutional:      General: She is not  in acute distress.    Appearance: Normal appearance. She is not ill-appearing, toxic-appearing or diaphoretic.  HENT:     Head: Normocephalic.     Nose: Nose normal.     Mouth/Throat:     Pharynx: Oropharynx is clear.  Eyes:     Conjunctiva/sclera: Conjunctivae normal.  Neck:     Musculoskeletal: Normal range of motion.  Pulmonary:     Effort: Pulmonary effort is normal.  Abdominal:     Palpations: Abdomen is soft.     Tenderness: There is no abdominal tenderness.  Musculoskeletal: Normal range of motion.  Skin:    General: Skin is warm and dry.     Findings: No rash.  Neurological:     Mental Status: She is alert.  Psychiatric:        Mood and Affect: Mood normal.      UC Treatments / Results  Labs (all labs ordered are listed, but only abnormal results are displayed) Labs Reviewed  POCT URINALYSIS DIP (DEVICE) - Abnormal; Notable for the following components:      Result Value   Hgb urine dipstick SMALL (*)    Leukocytes,Ua TRACE (*)    All other components within normal limits  URINE CULTURE    EKG   Radiology No results found.  Procedures Procedures  (including critical care time)  Medications Ordered in UC Medications - No data to display  Initial Impression / Assessment and Plan / UC Course  I have reviewed the triage vital signs and the nursing notes.  Pertinent labs & imaging results that were available during my care of the patient were reviewed by me and considered in my medical decision making (see chart for details).     Urinary frequency-urine with trace leuks and hemoglobin.  Will send for culture. Opting to treat with Keflex in the meantime Recommended push fluids Follow up as needed for continued or worsening symptoms  Final Clinical Impressions(s) / UC Diagnoses   Final diagnoses:  Urinary frequency     Discharge Instructions     We will go ahead and treat you for urinary tract infection.  Keflex twice a day for 5 days Make sure you are drinking plenty of fluids.  We will send the urine for culture and call you if we need to change anything. Follow up as needed for continued or worsening symptoms     ED Prescriptions    Medication Sig Dispense Auth. Provider   cephALEXin (KEFLEX) 500 MG capsule Take 1 capsule (500 mg total) by mouth 2 (two) times daily for 5 days. 10 capsule Loura Halt A, NP     PDMP not reviewed this encounter.   Orvan July, NP 09/04/19 1258

## 2019-09-04 NOTE — Discharge Instructions (Addendum)
We will go ahead and treat you for urinary tract infection.  Keflex twice a day for 5 days Make sure you are drinking plenty of fluids.  We will send the urine for culture and call you if we need to change anything. Follow up as needed for continued or worsening symptoms

## 2019-09-05 LAB — URINE CULTURE: Culture: NO GROWTH

## 2019-10-10 ENCOUNTER — Other Ambulatory Visit: Payer: Self-pay

## 2019-10-10 ENCOUNTER — Ambulatory Visit (HOSPITAL_COMMUNITY)
Admission: EM | Admit: 2019-10-10 | Discharge: 2019-10-10 | Disposition: A | Discharge status: Home / Self Care | Payer: Self-pay | Attending: Family Medicine | Admitting: Family Medicine

## 2019-10-10 ENCOUNTER — Encounter (HOSPITAL_COMMUNITY): Payer: Self-pay

## 2019-10-10 DIAGNOSIS — R300 Dysuria: Secondary | ICD-10-CM | POA: Insufficient documentation

## 2019-10-10 LAB — POCT URINALYSIS DIP (DEVICE)
Bilirubin Urine: NEGATIVE
Glucose, UA: NEGATIVE mg/dL
Ketones, ur: NEGATIVE mg/dL
Leukocytes,Ua: NEGATIVE
Nitrite: NEGATIVE
Protein, ur: NEGATIVE mg/dL
Specific Gravity, Urine: >=1.03 (ref 1.005–1.030)
Urobilinogen, UA: 1 mg/dL (ref 0.0–1.0)
pH: 7 (ref 5.0–8.0)

## 2019-10-10 NOTE — ED Triage Notes (Signed)
Pt states she thinks she has a UTI. Pt states she has pressure when voiding x 5 days.

## 2019-10-10 NOTE — ED Provider Notes (Signed)
Valerie Andrews    CSN: [id number] Arrival date & time: 10/10/19  1137      History   Chief Complaint Chief Complaint  Patient presents with  . Urinary Tract Infection    HPI Valerie Andrews is a 53 y.o. female.   The history is provided by the patient. No language interpreter was used.  Urinary Tract Infection Pain quality:  Burning Pain severity:  Moderate Onset quality:  Sudden Duration:  5 days Timing:  Constant Progression:  Worsening Chronicity:  New Recent urinary tract infections: no   Relieved by:  Nothing Worsened by:  Nothing Ineffective treatments:  None tried Urinary symptoms: no discolored urine, no foul-smelling urine, no frequent urination and no bladder incontinence   Associated symptoms: abdominal pain   Associated symptoms: no fever, no flank pain, no nausea, no vaginal discharge and no vomiting     Past Medical History:  Diagnosis Date  . Arthritis   . Chicken pox   . GERD (gastroesophageal reflux disease)     Patient Active Problem List   Diagnosis Date Noted  . Esophageal reflux 04/25/2015  . Arthritis of both knees 04/25/2015    History reviewed. No pertinent surgical history.  OB History   No obstetric history on file.      Home Medications    Prior to Admission medications   Medication Sig Start Date End Date Taking? Authorizing Provider  hydrochlorothiazide (HYDRODIURIL) 25 MG tablet Take 1 tablet (25 mg total) by mouth daily. 04/18/18   Jearld Fenton, NP    Family History Family History  Problem Relation Age of Onset  . Hypertension Mother   . Stroke Father   . Diabetes Paternal Grandmother   . Cancer Neg Hx   . Breast cancer Neg Hx     Social History Social History   Tobacco Use  . Smoking status: Never Smoker  . Smokeless tobacco: Never Used  Substance Use Topics  . Alcohol use: No    Alcohol/week: 0.0 standard drinks  . Drug use: No     Allergies   Methocarbamol   Review of Systems Review  of Systems  Constitutional: Negative for activity change, appetite change, chills, diaphoresis, fatigue and fever.  Respiratory: Negative.   Cardiovascular: Negative.   Gastrointestinal: Positive for abdominal pain. Negative for nausea and vomiting.  Genitourinary: Negative for flank pain and vaginal discharge.  ROS: All are negatives   Physical Exam Triage Vital Signs ED Triage Vitals  Enc Vitals Group     BP 10/10/19 1210 126/74     Pulse --      Resp 10/10/19 1210 18     Temp 10/10/19 1210 98.2 F (36.8 C)     Temp Source 10/10/19 1210 Oral     SpO2 10/10/19 1210 92 %     Weight 10/10/19 1208 (!) 301 lb (136.5 kg)     Height --      Head Circumference --      Peak Flow --      Pain Score 10/10/19 1208 7     Pain Loc --      Pain Edu? --      Excl. in [place]? --    No data found.  Updated Vital Signs BP 126/74 (BP Location: Right Arm)   Temp 98.2 F (36.8 C) (Oral)   Resp 18   Wt (!) 301 lb (136.5 kg)   LMP 09/13/2019   SpO2 92%   BMI 47.14 kg/m   Visual  Acuity Right Eye Distance:   Left Eye Distance:   Bilateral Distance:    Right Eye Near:   Left Eye Near:    Bilateral Near:     Physical Exam Vitals signs and nursing note reviewed.  Constitutional:      General: She is not in acute distress.    Appearance: Normal appearance. She is normal weight. She is not ill-appearing or toxic-appearing.  Cardiovascular:     Rate and Rhythm: Normal rate and regular rhythm.     Pulses: Normal pulses.     Heart sounds: Normal heart sounds. No murmur.  Pulmonary:     Effort: Pulmonary effort is normal. No respiratory distress.     Breath sounds: Normal breath sounds.  Chest:     Chest wall: No tenderness.  Abdominal:     General: Abdomen is flat. Bowel sounds are normal. There is no distension.     Palpations: Abdomen is soft. There is no mass.     Tenderness: There is no abdominal tenderness.     Hernia: No hernia is present.  Neurological:     Mental Status:  She is alert and oriented to person, place, and time.      UC Treatments / Results  Labs (all labs ordered are listed, but only abnormal results are displayed) Labs Reviewed  POCT URINALYSIS DIP (DEVICE) - Abnormal; Notable for the following components:      Result Value   Hgb urine dipstick TRACE (*)    All other components within normal limits  URINE CULTURE  CERVICOVAGINAL ANCILLARY ONLY    EKG   Radiology No results found.  Procedures Procedures (including critical care time)  Medications Ordered in UC Medications - No data to display  Initial Impression / Assessment and Plan / UC Course  I have reviewed the triage vital signs and the nursing notes.  Pertinent labs & imaging results that were available during my care of the patient were reviewed by me and considered in my medical decision making (see chart for details).  Clinical Course as of Oct 09 1330  Tue Oct 10, 2019  1300 POCT Urinalysis, Dipstick [KA]    Clinical Course User Index [KA] Emerson Monte, FNP   UA was negative and showed trace of blood. We will further investigate  To rule out candida, BV and trichomonas infection. Was previously treated for UTI a month ago. Final Clinical Impressions(s) / UC Diagnoses   Final diagnoses:  Dysuria     Discharge Instructions     Increased water intake Will send urine for culture and will call if positive Follow up with OB-GYN  Return if symptom get worse    ED Prescriptions    None     PDMP not reviewed this encounter.   Emerson Monte, [place] 10/10/19 1335

## 2019-10-10 NOTE — Discharge Instructions (Addendum)
Increased water intake Will send urine for culture and will call if positive Lab collected will be sent and will call if positive Follow up with OB-GYN  Return if symptom get worse

## 2019-10-10 NOTE — ED Notes (Signed)
Cervicovaginal ancillary order completed. I sent to main lab for processing.

## 2019-10-11 LAB — URINE CULTURE

## 2019-10-12 ENCOUNTER — Telehealth: Payer: Self-pay | Admitting: Emergency Medicine

## 2019-10-12 LAB — CERVICOVAGINAL ANCILLARY ONLY
Bacterial vaginitis: POSITIVE — AB
Candida vaginitis: POSITIVE — AB
Trichomonas: NEGATIVE

## 2019-10-12 MED ORDER — FLUCONAZOLE 150 MG PO TABS: 150.0000 mg | ORAL_TABLET | Freq: Once | ORAL | 0 refills | Status: AC

## 2019-10-12 MED ORDER — METRONIDAZOLE 500 MG PO TABS: 500.0000 mg | ORAL_TABLET | Freq: Two times a day (BID) | ORAL | 0 refills | Status: AC

## 2019-10-12 NOTE — Telephone Encounter (Signed)
Bacterial vaginosis is positive. This was not treated at the urgent care visit.  Flagyl 500 mg BID x 7 days #14 no refills sent to patients pharmacy of choice.    Test for candida (yeast) was positive.  Prescription for fluconazole 150mg  po now, repeat dose in 3d if needed, #2 no refills, sent to the pharmacy of record.  Recheck or followup with PCP for further evaluation if symptoms are not improving.    Patient contacted by phone and made aware of    results. Pt verbalized understanding and had all questions answered.

## 2020-02-08 ENCOUNTER — Emergency Department: Payer: Self-pay

## 2020-02-08 ENCOUNTER — Emergency Department
Admission: EM | Admit: 2020-02-08 | Discharge: 2020-02-08 | Disposition: A | Discharge status: Home / Self Care | Payer: Self-pay | Attending: Emergency Medicine | Admitting: Emergency Medicine

## 2020-02-08 ENCOUNTER — Other Ambulatory Visit: Payer: Self-pay

## 2020-02-08 DIAGNOSIS — K219 Gastro-esophageal reflux disease without esophagitis: Secondary | ICD-10-CM | POA: Insufficient documentation

## 2020-02-08 DIAGNOSIS — R2243 Localized swelling, mass and lump, lower limb, bilateral: Secondary | ICD-10-CM | POA: Insufficient documentation

## 2020-02-08 DIAGNOSIS — M549 Dorsalgia, unspecified: Secondary | ICD-10-CM

## 2020-02-08 DIAGNOSIS — I10 Essential (primary) hypertension: Secondary | ICD-10-CM | POA: Insufficient documentation

## 2020-02-08 DIAGNOSIS — M546 Pain in thoracic spine: Secondary | ICD-10-CM | POA: Insufficient documentation

## 2020-02-08 LAB — COMPREHENSIVE METABOLIC PANEL
ALT: 13 U/L (ref 0–44)
AST: 16 U/L (ref 15–41)
Albumin: 3.4 g/dL — ABNORMAL LOW (ref 3.5–5.0)
Alkaline Phosphatase: 65 U/L (ref 38–126)
Anion gap: 6 (ref 5–15)
BUN: 15 mg/dL (ref 6–20)
CO2: 28 mmol/L (ref 22–32)
Calcium: 8.7 mg/dL — ABNORMAL LOW (ref 8.9–10.3)
Chloride: 105 mmol/L (ref 98–111)
Creatinine, Ser: 1.11 mg/dL — ABNORMAL HIGH (ref 0.44–1.00)
GFR calc Af Amer: >60 mL/min (ref >60)
GFR calc non Af Amer: 57 mL/min — ABNORMAL LOW (ref >60)
Glucose, Bld: 101 mg/dL — ABNORMAL HIGH (ref 70–99)
Potassium: 3.9 mmol/L (ref 3.5–5.1)
Sodium: 139 mmol/L (ref 135–145)
Total Bilirubin: 1 mg/dL (ref 0.3–1.2)
Total Protein: 6.6 g/dL (ref 6.5–8.1)

## 2020-02-08 LAB — CBC WITH DIFFERENTIAL/PLATELET
Abs Immature Granulocytes: 0.01 10*3/uL (ref 0.00–0.07)
Basophils Absolute: 0 10*3/uL (ref 0.0–0.1)
Basophils Relative: 1 %
Eosinophils Absolute: 0.2 10*3/uL (ref 0.0–0.5)
Eosinophils Relative: 4 %
HCT: 40.5 % (ref 36.0–46.0)
Hemoglobin: 12.8 g/dL (ref 12.0–15.0)
Immature Granulocytes: 0 %
Lymphocytes Relative: 42 %
Lymphs Abs: 2 10*3/uL (ref 0.7–4.0)
MCH: 22.5 pg — ABNORMAL LOW (ref 26.0–34.0)
MCHC: 31.6 g/dL (ref 30.0–36.0)
MCV: 71.3 fL — ABNORMAL LOW (ref 80.0–100.0)
Monocytes Absolute: 0.4 10*3/uL (ref 0.1–1.0)
Monocytes Relative: 8 %
Neutro Abs: 2.1 10*3/uL (ref 1.7–7.7)
Neutrophils Relative %: 45 %
Platelets: 157 10*3/uL (ref 150–400)
RBC: 5.68 MIL/uL — ABNORMAL HIGH (ref 3.87–5.11)
RDW: 15.5 % (ref 11.5–15.5)
WBC: 4.7 10*3/uL (ref 4.0–10.5)
nRBC: 0 % (ref 0.0–0.2)

## 2020-02-08 LAB — FIBRIN DERIVATIVES D-DIMER (ARMC ONLY): Fibrin derivatives D-dimer (ARMC): 499.36 ng/mL (FEU) — ABNORMAL HIGH (ref 0.00–499.00)

## 2020-02-08 LAB — TROPONIN I (HIGH SENSITIVITY): Troponin I (High Sensitivity): 5 ng/L (ref <18)

## 2020-02-08 MED ORDER — TRAMADOL HCL 50 MG PO TABS: 50.0000 mg | ORAL_TABLET | Freq: Four times a day (QID) | ORAL | 0 refills | Status: DC | PRN

## 2020-02-08 MED ORDER — BACLOFEN 10 MG PO TABS: 10.0000 mg | ORAL_TABLET | Freq: Every day | ORAL | 1 refills | Status: AC

## 2020-02-08 NOTE — ED Triage Notes (Signed)
Pt comes via POV from home with c/o up[per back pain. Pt states this started a few days ago. Pt states no recent injuries.

## 2020-02-08 NOTE — ED Provider Notes (Signed)
[facility] Emergency Department Provider Note  ____________________________________________   First MD Initiated Contact with Patient 02/08/20 1805     (approximate)  I have reviewed the triage vital signs and the nursing notes.   HISTORY  Chief Complaint Back Pain    HPI Valerie Andrews is a 54 y.o. female's emergency department with upper mid back pain.  No known injury.  States pain is not reproduced with movement.  No tenderness when she touches it.  She states just odd feeling.  She denies chest pain/shortness of breath.  She denies abdominal pain.  Patient does have a history of hypertension.  States that she was given a blood pressure medicine but does not take it on a regular basis.  Does not have a family doctor.  She did have a physical 1 year ago which everything was normal.    Past Medical History:  Diagnosis Date  . Arthritis   . Chicken pox   . GERD (gastroesophageal reflux disease)     Patient Active Problem List   Diagnosis Date Noted  . Esophageal reflux 04/25/2015  . Arthritis of both knees 04/25/2015    History reviewed. No pertinent surgical history.  Prior to Admission medications   Medication Sig Start Date End Date Taking? Authorizing Provider  baclofen (LIORESAL) 10 MG tablet Take 1 tablet (10 mg total) by mouth daily. 02/08/20 02/07/21  Maleka Contino, Linden Dolin, PA-C  hydrochlorothiazide (HYDRODIURIL) 25 MG tablet Take 1 tablet (25 mg total) by mouth daily. 04/18/18   Jearld Fenton, NP  traMADol (ULTRAM) 50 MG tablet Take 1 tablet (50 mg total) by mouth every 6 (six) hours as needed. 02/08/20   Caryn Section Linden Dolin, PA-C    Allergies Methocarbamol  Family History  Problem Relation Age of Onset  . Hypertension Mother   . Stroke Father   . Diabetes Paternal Grandmother   . Cancer Neg Hx   . Breast cancer Neg Hx     Social History Social History   Tobacco Use  . Smoking status: Never Smoker  . Smokeless tobacco: Never Used    Substance Use Topics  . Alcohol use: No    Alcohol/week: 0.0 standard drinks  . Drug use: No    Review of Systems  Constitutional: No fever/chills Eyes: No visual changes. ENT: No sore throat. Respiratory: Denies cough Cardiovascular: Denies chest pain Gastrointestinal: Denies abdominal pain Genitourinary: Negative for dysuria. Musculoskeletal: Positive for back pain. Skin: Negative for rash. Psychiatric: no mood changes,     ____________________________________________   PHYSICAL EXAM:  VITAL SIGNS: ED Triage Vitals  Enc Vitals Group     BP 02/08/20 1711 (!) 141/79     Pulse Rate 02/08/20 1711 82     Resp 02/08/20 1711 18     Temp 02/08/20 1711 98.3 F (36.8 C)     Temp src --      SpO2 02/08/20 1711 99 %     Weight 02/08/20 1709 255 lb (115.7 kg)     Height 02/08/20 1709 5\' 7"  (1.702 m)     Head Circumference --      Peak Flow --      Pain Score 02/08/20 1709 8     Pain Loc --      Pain Edu? --      Excl. in [place]? --     Constitutional: Alert and oriented. Well appearing and in no acute distress. Eyes: Conjunctivae are normal.  Head: Atraumatic. Nose: No congestion/rhinnorhea. Mouth/Throat: Mucous  membranes are moist.   Neck:  supple no lymphadenopathy noted Cardiovascular: Normal rate, regular rhythm. Heart sounds are normal Respiratory: Normal respiratory effort.  No retractions, lungs c t a  GU: deferred Musculoskeletal: FROM all extremities, warm and well perfused, spine is nontender, shoulders are nontender, no tenderness is noted on the entire back, some swelling in the extremities but patient states this is normal for her. Neurologic:  Normal speech and language.  Skin:  Skin is warm, dry and intact. No rash noted. Psychiatric: Mood and affect are normal. Speech and behavior are normal.  ____________________________________________   LABS (all labs ordered are listed, but only abnormal results are displayed)  Labs Reviewed  COMPREHENSIVE  METABOLIC PANEL - Abnormal; Notable for the following components:      Result Value   Glucose, Bld 101 (*)    Creatinine, Ser 1.11 (*)    Calcium 8.7 (*)    Albumin 3.4 (*)    GFR calc non Af Amer 57 (*)    All other components within normal limits  CBC WITH DIFFERENTIAL/PLATELET - Abnormal; Notable for the following components:   RBC 5.68 (*)    MCV 71.3 (*)    MCH 22.5 (*)    All other components within normal limits  FIBRIN DERIVATIVES D-DIMER ([facility] ONLY) - Abnormal; Notable for the following components:   Fibrin derivatives D-dimer ([facility]) 499.36 (*)    All other components within normal limits  TROPONIN I (HIGH SENSITIVITY)  TROPONIN I (HIGH SENSITIVITY)   ____________________________________________   ____________________________________________  RADIOLOGY  Chest x-ray is normal  ____________________________________________   PROCEDURES  Procedure(s) performed: EKG shows normal sinus rhythm   Procedures    ____________________________________________   INITIAL IMPRESSION / ASSESSMENT AND PLAN / ED COURSE  Pertinent labs & imaging results that were available during my care of the patient were reviewed by me and considered in my medical decision making (see chart for details).   Patient is a 54 year old female presents emergency department with mid upper back pain.  See HPI  Physical exam shows patient to appear well.  Back is nontender.  Due to the lack of physical findings for musculoskeletal pain I do feel that the patient should have a cardiac work-up.  Due to her age, weight, and elevated blood pressure.  DDx: Musculoskeletal pain, MI, PE, pneumonia  BC is basically normal, comprehensive metabolic panel [place] increased creatinine of 1.11 which is minimal, troponin is normal, D-dimer is normal when evaluated with her age.  I did explain all of the findings to the patient.  She is to follow-up with her regular doctor or return to emergency department if  worsening.  Is given a prescription for baclofen and tramadol.  She states she understands all of the instructions.  She will return if worsening.  She is discharged stable condition.    Valerie Andrews was evaluated in Emergency Department on 02/08/2020 for the symptoms described in the history of present illness. She was evaluated in the context of the global COVID-19 pandemic, which necessitated consideration that the patient might be at risk for infection with the SARS-CoV-2 virus that causes COVID-19. Institutional protocols and algorithms that pertain to the evaluation of patients at risk for COVID-19 are in a state of rapid change based on information released by regulatory bodies including the CDC and federal and state organizations. These policies and algorithms were followed during the patient's care in the ED.   As part of my medical decision making, I reviewed the  following data within the [facility] notes reviewed and incorporated, Labs reviewed , EKG interpreted NSR, Old chart reviewed, Radiograph reviewed , Notes from prior ED visits and [place] Controlled Substance Database  ____________________________________________   FINAL CLINICAL IMPRESSION(S) / ED DIAGNOSES  Final diagnoses:  Mid back pain      NEW MEDICATIONS STARTED DURING THIS VISIT:  New Prescriptions   BACLOFEN (LIORESAL) 10 MG TABLET    Take 1 tablet (10 mg total) by mouth daily.   TRAMADOL (ULTRAM) 50 MG TABLET    Take 1 tablet (50 mg total) by mouth every 6 (six) hours as needed.     Note:  This document was prepared using Dragon voice recognition software and may include unintentional dictation errors.    Versie Starks, PA-C 02/08/20 2019    Earleen Newport, MD 02/08/20 2231

## 2020-02-08 NOTE — ED Notes (Signed)
Physical copy of dc paperwork signed and sent to records.

## 2020-02-08 NOTE — Discharge Instructions (Addendum)
Follow-up with your regular doctor if not improving in 3 to 5 days.  Return emergency department worsening.  Take your medications as prescribed.

## 2020-04-18 ENCOUNTER — Ambulatory Visit: Payer: Self-pay | Admitting: Podiatry

## 2020-10-25 ENCOUNTER — Emergency Department
Admission: EM | Admit: 2020-10-25 | Discharge: 2020-10-25 | Disposition: A | Discharge status: Home / Self Care | Payer: Self-pay | Attending: Emergency Medicine | Admitting: Emergency Medicine

## 2020-10-25 ENCOUNTER — Other Ambulatory Visit: Payer: Self-pay

## 2020-10-25 DIAGNOSIS — R102 Pelvic and perineal pain: Secondary | ICD-10-CM | POA: Insufficient documentation

## 2020-10-25 MED ORDER — VALACYCLOVIR HCL 1 G PO TABS: 2000.0000 mg | ORAL_TABLET | Freq: Three times a day (TID) | ORAL | 0 refills | Status: AC

## 2020-10-25 MED ORDER — VALACYCLOVIR HCL 1 G PO TABS: 2000.0000 mg | ORAL_TABLET | Freq: Three times a day (TID) | ORAL | 0 refills | Status: DC

## 2020-10-25 NOTE — Discharge Instructions (Signed)
Take Valtrex three times daily for the next seven days.

## 2020-10-25 NOTE — ED Notes (Signed)
Provider Sherral Hammers assessing pt currently; this RN remains at bedside. Pt denies itching.

## 2020-10-25 NOTE — ED Provider Notes (Signed)
Emergency Department Provider Note  ____________________________________________  Time seen: Approximately 11:02 PM  I have reviewed the triage vital signs and the nursing notes.   HISTORY  Chief Complaint Vaginal Pain   Historian Patient     HPI Valerie Andrews is a 54 y.o. female presents to the emergency department with vaginal burning.  Patient has a history of genital herpes and states that her current symptoms feel similar.  She denies knowledge of rash.  No vaginal pruritus.  No changes in vaginal odor.  No dyspareunia.  No concern for STDs.  Patient denies dysuria, hematuria or increased urinary frequency.  No other alleviating measures have been attempted.   Past Medical History:  Diagnosis Date  . Arthritis   . Chicken pox   . GERD (gastroesophageal reflux disease)      Immunizations up to date:  Yes.     Past Medical History:  Diagnosis Date  . Arthritis   . Chicken pox   . GERD (gastroesophageal reflux disease)     Patient Active Problem List   Diagnosis Date Noted  . Esophageal reflux 04/25/2015  . Arthritis of both knees 04/25/2015    History reviewed. No pertinent surgical history.  Prior to Admission medications   Medication Sig Start Date End Date Taking? Authorizing Provider  baclofen (LIORESAL) 10 MG tablet Take 1 tablet (10 mg total) by mouth daily. 02/08/20 02/07/21  Fisher, Linden Dolin, PA-C  hydrochlorothiazide (HYDRODIURIL) 25 MG tablet Take 1 tablet (25 mg total) by mouth daily. 04/18/18   Jearld Fenton, NP  traMADol (ULTRAM) 50 MG tablet Take 1 tablet (50 mg total) by mouth every 6 (six) hours as needed. 02/08/20   Fisher, Linden Dolin, PA-C  valACYclovir (VALTREX) 1000 MG tablet Take 2 tablets (2,000 mg total) by mouth 3 (three) times daily for 7 days. 10/25/20 11/01/20  Lannie Fields, PA-C    Allergies Methocarbamol  Family History  Problem Relation Age of Onset  . Hypertension Mother   . Stroke Father   . Diabetes Paternal Grandmother    . Cancer Neg Hx   . Breast cancer Neg Hx     Social History Social History   Tobacco Use  . Smoking status: Never Smoker  . Smokeless tobacco: Never Used  Substance Use Topics  . Alcohol use: No    Alcohol/week: 0.0 standard drinks  . Drug use: No     Review of Systems  Constitutional: No fever/chills Eyes:  No discharge ENT: No upper respiratory complaints. Respiratory: no cough. No SOB/ use of accessory muscles to breath Gastrointestinal:   No nausea, no vomiting.  No diarrhea.  No constipation. Genitourinary: Patient has vaginal burning. Musculoskeletal: Negative for musculoskeletal pain. Skin: Negative for rash, abrasions, lacerations, ecchymosis.  ____________________________________________   PHYSICAL EXAM:  VITAL SIGNS: ED Triage Vitals [10/25/20 2037]  Enc Vitals Group     BP (!) 158/76     Pulse Rate 95     Resp 18     Temp 97.7 F (36.5 C)     Temp Source Oral     SpO2 99 %     Weight 230 lb (104.3 kg)     Height 5\' 8"  (1.727 m)     Head Circumference      Peak Flow      Pain Score 7     Pain Loc      Pain Edu?      Excl. in [place]?      Constitutional: Alert and  oriented. Well appearing and in no acute distress. Eyes: Conjunctivae are normal. PERRL. EOMI. Head: Atraumatic. Cardiovascular: Normal rate, regular rhythm. Normal S1 and S2.  Good peripheral circulation. Respiratory: Normal respiratory effort without tachypnea or retractions. Lungs CTAB. Good air entry to the bases with no decreased or absent breath sounds Gastrointestinal: Bowel sounds x 4 quadrants. Soft and nontender to palpation. No guarding or rigidity. No distention. Musculoskeletal: Full range of motion to all extremities. No obvious deformities noted Neurologic:  Normal for age. No gross focal neurologic deficits are appreciated.  Skin: Patient denies no vaginal rash.  No edema of the labia. Psychiatric: Mood and affect are normal for age. Speech and behavior are normal.    ____________________________________________   LABS (all labs ordered are listed, but only abnormal results are displayed)  Labs Reviewed - No data to display ____________________________________________  EKG   ____________________________________________  RADIOLOGY  No results found.  ____________________________________________    PROCEDURES  Procedure(s) performed:     Procedures     Medications - No data to display   ____________________________________________   INITIAL IMPRESSION / ASSESSMENT AND PLAN / ED COURSE  Pertinent labs & imaging results that were available during my care of the patient were reviewed by me and considered in my medical decision making (see chart for details).      Assessment and plan Vaginal pain 54 year old female presents to the emergency department with vaginal burning for the past 3 days.  Patient was hypertensive at triage but vital signs were otherwise reassuring.  On exam, patient had no rash but I feel that given patient's history of genital herpes, patient is probably currently experiencing a prodrome for an outbreak.  Will treat with Valtrex 3 times daily for the next 7 days.  All patient questions were answered.    ____________________________________________  FINAL CLINICAL IMPRESSION(S) / ED DIAGNOSES  Final diagnoses:  Vaginal pain      NEW MEDICATIONS STARTED DURING THIS VISIT:  ED Discharge Orders         Ordered    valACYclovir (VALTREX) 1000 MG tablet  3 times daily        10/25/20 2259              This chart was dictated using voice recognition software/Dragon. Despite best efforts to proofread, errors can occur which can change the meaning. Any change was purely unintentional.     Lannie Fields, PA-C 10/25/20 2306    Delman Kitten, MD 10/28/20 1534

## 2020-10-25 NOTE — ED Triage Notes (Signed)
PT to ED for pain in genital region. States she started using a new kind of pads and since it has been burning and swollen on her skin. No bumps, no pain inside, no discharge.

## 2020-10-25 NOTE — ED Notes (Signed)
See triage note. Pt denies changes in sexual activity or that she has had any new partners. States burning in vaginal area is constant; denies that it is triggered while urinating.

## 2021-01-26 ENCOUNTER — Emergency Department (HOSPITAL_COMMUNITY)
Admission: EM | Admit: 2021-01-26 | Discharge: 2021-01-26 | Disposition: A | Discharge status: Home / Self Care | Payer: Self-pay | Attending: Emergency Medicine | Admitting: Emergency Medicine

## 2021-01-26 ENCOUNTER — Encounter (HOSPITAL_COMMUNITY): Payer: Self-pay | Admitting: Emergency Medicine

## 2021-01-26 DIAGNOSIS — H1131 Conjunctival hemorrhage, right eye: Secondary | ICD-10-CM | POA: Insufficient documentation

## 2021-01-26 NOTE — ED Provider Notes (Deleted)
  [facility] EMERGENCY DEPARTMENT Provider Note   CSN: [id number] Arrival date & time: 01/26/21  1600     History Chief Complaint  Patient presents with  . Eye Problem    Valerie Andrews is a 55 y.o. female.  HPI Disregard this blank note.  There is a complete note on chart.  Unable to delete this note at this time.    Past Medical History:  Diagnosis Date  . Arthritis   . Chicken pox   . GERD (gastroesophageal reflux disease)     Patient Active Problem List   Diagnosis Date Noted  . Esophageal reflux 04/25/2015  . Arthritis of both knees 04/25/2015    History reviewed. No pertinent surgical history.   OB History   No obstetric history on file.     Family History  Problem Relation Age of Onset  . Hypertension Mother   . Stroke Father   . Diabetes Paternal Grandmother   . Cancer Neg Hx   . Breast cancer Neg Hx     Social History   Tobacco Use  . Smoking status: Never Smoker  . Smokeless tobacco: Never Used  Substance Use Topics  . Alcohol use: No    Alcohol/week: 0.0 standard drinks  . Drug use: No    Home Medications Prior to Admission medications   Medication Sig Start Date End Date Taking? Authorizing Provider  baclofen (LIORESAL) 10 MG tablet Take 1 tablet (10 mg total) by mouth daily. 02/08/20 02/07/21  Fisher, Linden Dolin, PA-C  hydrochlorothiazide (HYDRODIURIL) 25 MG tablet Take 1 tablet (25 mg total) by mouth daily. 04/18/18   Jearld Fenton, NP  traMADol (ULTRAM) 50 MG tablet Take 1 tablet (50 mg total) by mouth every 6 (six) hours as needed. 02/08/20   Caryn Section Linden Dolin, PA-C    Allergies    Methocarbamol  Review of Systems   Review of Systems  Physical Exam Updated Vital Signs BP (!) 115/94 (BP Location: Left Arm)   Pulse (!) 111   Temp 99 F (37.2 C)   Resp 16   SpO2 99%   Physical Exam  ED Results / Procedures / Treatments   Labs (all labs ordered are listed, but only abnormal results are displayed) Labs Reviewed  - No data to display  EKG None  Radiology No results found.  Procedures Procedures   Medications Ordered in ED Medications - No data to display  ED Course  I have reviewed the triage vital signs and the nursing notes.  Pertinent labs & imaging results that were available during my care of the patient were reviewed by me and considered in my medical decision making (see chart for details).    MDM Rules/Calculators/A&P                         Final Clinical Impression(s) / ED Diagnoses Final diagnoses:  None    Rx / DC Orders ED Discharge Orders    None       Charlesetta Shanks, MD 01/31/21 [phone]

## 2021-01-26 NOTE — Discharge Instructions (Signed)
1.  A subconjunctival hemorrhage occurs from a broken blood vessel on the surface of the eye.  It is similar to a bruise.  It should heal on its own.  It needs no particular treatment.  Try to protect the eye from any trauma or injury. 2.  If it is not going away, if you are getting any problems with your vision, if you are getting pain it should be rechecked by an ophthalmologist or in the emergency department.

## 2021-01-26 NOTE — ED Provider Notes (Signed)
[facility] EMERGENCY DEPARTMENT Provider Note   CSN: [id number] Arrival date & time: 01/26/21  1600     History Chief Complaint  Patient presents with  . Eye Problem    Valerie Andrews is a 55 y.o. female.  HPI Patient reports that she woke up yesterday morning and family members pointed out that she had a bright red area in her eye.  There were no associated symptoms.  No changes in vision, no pain, no blurred vision.  No eye drainage.  No known trauma that she is aware.  She does not wear contact lenses.  Thought she try some over-the-counter drops but it did not change anything.    Past Medical History:  Diagnosis Date  . Arthritis   . Chicken pox   . GERD (gastroesophageal reflux disease)     Patient Active Problem List   Diagnosis Date Noted  . Esophageal reflux 04/25/2015  . Arthritis of both knees 04/25/2015    History reviewed. No pertinent surgical history.   OB History   No obstetric history on file.     Family History  Problem Relation Age of Onset  . Hypertension Mother   . Stroke Father   . Diabetes Paternal Grandmother   . Cancer Neg Hx   . Breast cancer Neg Hx     Social History   Tobacco Use  . Smoking status: Never Smoker  . Smokeless tobacco: Never Used  Substance Use Topics  . Alcohol use: No    Alcohol/week: 0.0 standard drinks  . Drug use: No    Home Medications Prior to Admission medications   Medication Sig Start Date End Date Taking? Authorizing Provider  baclofen (LIORESAL) 10 MG tablet Take 1 tablet (10 mg total) by mouth daily. 02/08/20 02/07/21  Fisher, Linden Dolin, PA-C  hydrochlorothiazide (HYDRODIURIL) 25 MG tablet Take 1 tablet (25 mg total) by mouth daily. 04/18/18   Jearld Fenton, NP  traMADol (ULTRAM) 50 MG tablet Take 1 tablet (50 mg total) by mouth every 6 (six) hours as needed. 02/08/20   Fisher, Linden Dolin, PA-C    Allergies    Methocarbamol  Review of Systems   Review of Systems Contusional: No  fever no chills Neurologic: No headache no visual disturbance Physical Exam Updated Vital Signs BP (!) 115/94 (BP Location: Left Arm)   Pulse (!) 111   Temp 99 F (37.2 C)   Resp 16   SpO2 99%   Physical Exam Constitutional:      Appearance: Normal appearance.  HENT:     Head: Normocephalic and atraumatic.     Nose: Nose normal.     Mouth/Throat:     Mouth: Mucous membranes are moist.     Pharynx: Oropharynx is clear.  Eyes:     Extraocular Movements: Extraocular movements intact.     Pupils: Pupils are equal, round, and reactive to light.     Comments: Patient has a moderate sized subconjunctival hemorrhage medial right eye.  Does not cross the midline.  Pupil is brisk and symmetric with normal light response.  Extraocular motions normal.  Lids normal without swelling  Pulmonary:     Effort: Pulmonary effort is normal.  Skin:    General: Skin is warm and dry.  Neurological:     General: No focal deficit present.     Mental Status: She is alert and oriented to person, place, and time.  Psychiatric:        Mood and Affect: Mood  normal.     ED Results / Procedures / Treatments   Labs (all labs ordered are listed, but only abnormal results are displayed) Labs Reviewed - No data to display  EKG None  Radiology No results found.  Procedures Procedures   Medications Ordered in ED Medications - No data to display  ED Course  I have reviewed the triage vital signs and the nursing notes.  Pertinent labs & imaging results that were available during my care of the patient were reviewed by me and considered in my medical decision making (see chart for details).    MDM Rules/Calculators/A&P                          Patient is normotensive.  She does not take any anticoagulants.  She has a uncomplicated appearing subconjunctival hemorrhage.  No associated symptoms.  Conservative treatment recommended.  Return precautions reviewed. Final Clinical Impression(s) / ED  Diagnoses Final diagnoses:  Subconjunctival hemorrhage of right eye    Rx / DC Orders ED Discharge Orders    None       Charlesetta Shanks, MD 01/26/21 [phone]

## 2021-01-26 NOTE — ED Triage Notes (Signed)
States she woke up yesterday with blood in R eye.  Denies pain or vision problems.

## 2021-01-29 ENCOUNTER — Emergency Department
Admission: EM | Admit: 2021-01-29 | Discharge: 2021-01-29 | Disposition: A | Discharge status: Home / Self Care | Payer: Self-pay | Attending: Emergency Medicine | Admitting: Emergency Medicine

## 2021-01-29 ENCOUNTER — Other Ambulatory Visit: Payer: Self-pay

## 2021-01-29 ENCOUNTER — Other Ambulatory Visit: Payer: Self-pay | Admitting: Emergency Medicine

## 2021-01-29 DIAGNOSIS — H1031 Unspecified acute conjunctivitis, right eye: Secondary | ICD-10-CM | POA: Insufficient documentation

## 2021-01-29 DIAGNOSIS — Z79899 Other long term (current) drug therapy: Secondary | ICD-10-CM | POA: Insufficient documentation

## 2021-01-29 MED ORDER — CETIRIZINE HCL 10 MG PO TABS: 10.0000 mg | ORAL_TABLET | Freq: Every day | ORAL | 2 refills | Status: DC

## 2021-01-29 MED ORDER — MOXIFLOXACIN HCL 0.5 % OP SOLN: 1.0000 [drp] | Freq: Three times a day (TID) | OPHTHALMIC | 0 refills | Status: AC

## 2021-01-29 NOTE — ED Triage Notes (Signed)
Pt comes pov with right eye redness, burning, itching since Sunday.

## 2021-01-29 NOTE — Discharge Instructions (Signed)
Please 1 drop of moxifloxacin into right eye 3 times daily for the next 7 days. Please take cetirizine, 1 mg tablet before bed.

## 2021-01-29 NOTE — ED Provider Notes (Signed)
[facility]-EMERGENCY DEPARTMENT  ____________________________________________  Time seen: Approximately 5:22 PM  I have reviewed the triage vital signs and the nursing notes.   HISTORY  Chief Complaint Eye Pain   Historian Patient   HPI Valerie Andrews is a 55 y.o. female presents to the emergency department with right eye conjunctivitis that started on Sunday.  Patient states that she has had increased tearing, redness and pruritus of the right eye.  She states that her left eye has been unaffected.  She is concerned that someone at work had [place].  She wears reading glasses but does not use contact lenses.  She has had no major changes in vision.  No associated cough.  She has had some rhinorrhea and nasal congestion.   Past Medical History:  Diagnosis Date  . Arthritis   . Chicken pox   . GERD (gastroesophageal reflux disease)      Immunizations up to date:  Yes.     Past Medical History:  Diagnosis Date  . Arthritis   . Chicken pox   . GERD (gastroesophageal reflux disease)     Patient Active Problem List   Diagnosis Date Noted  . Esophageal reflux 04/25/2015  . Arthritis of both knees 04/25/2015    History reviewed. No pertinent surgical history.  Prior to Admission medications   Medication Sig Start Date End Date Taking? Authorizing Provider  cetirizine (ZYRTEC ALLERGY) 10 MG tablet Take 1 tablet (10 mg total) by mouth daily for 10 days. 01/29/21 02/08/21 Yes Vallarie Mare M, PA-C  moxifloxacin (VIGAMOX) 0.5 % ophthalmic solution Place 1 drop into the right eye 3 (three) times daily for 7 days. 01/29/21 02/05/21 Yes Vallarie Mare M, PA-C  baclofen (LIORESAL) 10 MG tablet Take 1 tablet (10 mg total) by mouth daily. 02/08/20 02/07/21  Fisher, Linden Dolin, PA-C  hydrochlorothiazide (HYDRODIURIL) 25 MG tablet Take 1 tablet (25 mg total) by mouth daily. 04/18/18   Jearld Fenton, NP  traMADol (ULTRAM) 50 MG tablet Take 1 tablet (50 mg total) by mouth every 6 (six) hours as  needed. 02/08/20   Caryn Section Linden Dolin, PA-C    Allergies Methocarbamol  Family History  Problem Relation Age of Onset  . Hypertension Mother   . Stroke Father   . Diabetes Paternal Grandmother   . Cancer Neg Hx   . Breast cancer Neg Hx     Social History Social History   Tobacco Use  . Smoking status: Never Smoker  . Smokeless tobacco: Never Used  Substance Use Topics  . Alcohol use: No    Alcohol/week: 0.0 standard drinks  . Drug use: No     Review of Systems  Constitutional: No fever/chills Eyes: Patient has right eye conjunctivitis.  ENT: No upper respiratory complaints. Respiratory: no cough. No SOB/ use of accessory muscles to breath Gastrointestinal:   No nausea, no vomiting.  No diarrhea.  No constipation. Musculoskeletal: Negative for musculoskeletal pain. Skin: Negative for rash, abrasions, lacerations, ecchymosis.    ____________________________________________   PHYSICAL EXAM:  VITAL SIGNS: ED Triage Vitals  Enc Vitals Group     BP 01/29/21 1520 (!) 152/86     Pulse Rate 01/29/21 1520 88     Resp 01/29/21 1520 18     Temp 01/29/21 1520 98.1 F (36.7 C)     Temp Source 01/29/21 1520 Oral     SpO2 01/29/21 1520 100 %     Weight 01/29/21 1518 285 lb (129.3 kg)     Height 01/29/21 1518 5'  7" (1.702 m)     Head Circumference --      Peak Flow --      Pain Score 01/29/21 1518 7     Pain Loc --      Pain Edu? --      Excl. in [place]? --      Constitutional: Alert and oriented. Well appearing and in no acute distress. Eyes: Patient has conjunctivitis of right eye.  PERRL. EOMI. Head: Atraumatic. ENT:      Nose: No congestion/rhinnorhea.      Mouth/Throat: Mucous membranes are moist.  Neck: No stridor.  No cervical spine tenderness to palpation. Cardiovascular: Normal rate, regular rhythm. Normal S1 and S2.  Good peripheral circulation. Respiratory: Normal respiratory effort without tachypnea or retractions. Lungs CTAB. Good air entry to the bases  with no decreased or absent breath sounds Gastrointestinal: Bowel sounds x 4 quadrants. Soft and nontender to palpation. No guarding or rigidity. No distention. Musculoskeletal: Full range of motion to all extremities. No obvious deformities noted Neurologic:  Normal for age. No gross focal neurologic deficits are appreciated.  Skin:  Skin is warm, dry and intact. No rash noted. Psychiatric: Mood and affect are normal for age. Speech and behavior are normal.   ____________________________________________   LABS (all labs ordered are listed, but only abnormal results are displayed)  Labs Reviewed - No data to display ____________________________________________  EKG   ____________________________________________  RADIOLOGY   No results found.  ____________________________________________    PROCEDURES  Procedure(s) performed:     Procedures     Medications - No data to display   ____________________________________________   INITIAL IMPRESSION / ASSESSMENT AND PLAN / ED COURSE  Pertinent labs & imaging results that were available during my care of the patient were reviewed by me and considered in my medical decision making (see chart for details).      Assessment and plan Right eye conjunctivitis 55 year old female presents to the emergency department with right eye conjunctivitis that started on Sunday.  Patient was hypertensive at triage but vital signs were otherwise reassuring.  Differential includes bacterial conjunctivitis versus allergic.  Unilateral involvement increases suspicion for bacterial conjunctivitis.  Will treat with moxifloxacin 3 times daily for the next 7 days we will add on Zyrtec 1 daily to cover for an allergic component.     ____________________________________________  FINAL CLINICAL IMPRESSION(S) / ED DIAGNOSES  Final diagnoses:  Acute conjunctivitis of right eye, unspecified acute conjunctivitis type      NEW  MEDICATIONS STARTED DURING THIS VISIT:  ED Discharge Orders         Ordered    moxifloxacin (VIGAMOX) 0.5 % ophthalmic solution  3 times daily        01/29/21 1723    cetirizine (ZYRTEC ALLERGY) 10 MG tablet  Daily        03 /23/22 1725              This chart was dictated using voice recognition software/Dragon. Despite best efforts to proofread, errors can occur which can change the meaning. Any change was purely unintentional.     Lannie Fields, PA-C 01/29/21 1747    Nance Pear, MD 01/29/21 [phone]

## 2021-03-24 ENCOUNTER — Other Ambulatory Visit: Payer: Self-pay

## 2021-04-15 ENCOUNTER — Ambulatory Visit (INDEPENDENT_AMBULATORY_CARE_PROVIDER_SITE_OTHER): Payer: 59 | Admitting: Podiatry

## 2021-04-15 ENCOUNTER — Other Ambulatory Visit: Payer: Self-pay

## 2021-04-15 DIAGNOSIS — L989 Disorder of the skin and subcutaneous tissue, unspecified: Secondary | ICD-10-CM

## 2021-04-17 ENCOUNTER — Encounter: Payer: Self-pay | Admitting: Podiatry

## 2021-04-17 NOTE — Progress Notes (Signed)
  Subjective:  Patient ID: Valerie Andrews, female    DOB: [birth year],  MRN: [id number]  Chief Complaint  Patient presents with   Callouses    Bilateral calluses causing discomfort     55 y.o. female presents with the above complaint.  Patient presents with bilateral submetatarsal 3 hyperkeratotic lesion/porokeratosis/benign skin lesion.  Patient states is painful to touch.  She states hurts with ambulation.  She denies any other acute complaints she has not seen anyone else prior to seeing me.  She is try some shoe gear modification none of which has helped.  She has not done any over-the-counter treatment options for it.  She denies any other acute complaints.   Review of Systems: Negative except as noted in the HPI. Denies N/V/F/Ch.  Past Medical History:  Diagnosis Date   Arthritis    Chicken pox    GERD (gastroesophageal reflux disease)     Current Outpatient Medications:    cetirizine (ZYRTEC ALLERGY) 10 MG tablet, Take 1 tablet (10 mg total) by mouth daily for 10 days., Disp: 30 tablet, Rfl: 2   cetirizine (ZYRTEC) 10 MG tablet, TAKE 1 TABLET (10 MG TOTAL) BY MOUTH DAILY FOR 10 DAYS., Disp: 30 tablet, Rfl: 2   hydrochlorothiazide (HYDRODIURIL) 25 MG tablet, Take 1 tablet (25 mg total) by mouth daily., Disp: 30 tablet, Rfl: 2   moxifloxacin (VIGAMOX) 0.5 % ophthalmic solution, PLACE 1 DROP INTO THE RIGHT EYE 3 (THREE) TIMES DAILY FOR 7 DAYS., Disp: 3 mL, Rfl: 0   traMADol (ULTRAM) 50 MG tablet, Take 1 tablet (50 mg total) by mouth every 6 (six) hours as needed., Disp: 15 tablet, Rfl: 0  Social History   Tobacco Use  Smoking Status Never  Smokeless Tobacco Never    Allergies  Allergen Reactions   Methocarbamol Other (See Comments)    headache   Objective:  There were no vitals filed for this visit. There is no height or weight on file to calculate BMI. Constitutional Well developed. Well nourished.  Vascular Dorsalis pedis pulses palpable bilaterally. Posterior tibial  pulses palpable bilaterally. Capillary refill normal to all digits.  No cyanosis or clubbing noted. Pedal hair growth normal.  Neurologic Normal speech. Oriented to person, place, and time. Epicritic sensation to light touch grossly present bilaterally.  Dermatologic N hyperkeratotic lesion without pinpoint bleeding noted.  Central nucleated core noted.  Pain on palpation to the skin lesion.  Orthopedic: Normal joint ROM without pain or crepitus bilaterally. No visible deformities. No bony tenderness.   Radiographs: None Assessment:   1. Benign skin lesion    Plan:  Patient was evaluated and treated and all questions answered.  Benign skin lesion bilateral submetatarsal 3 -I explained the patient the etiology of his benign skin lesion and various treatment options were discussed.  Given the amount of pain she is having I believe she would benefit from debridement of the lesion.  Using chisel blade to handle the lesion was debrided down to healthy striated tissue followed by excision of central nucleated core.  She had immediate relief.  No complication noted no pinpoint bleeding noted. -Offloading pads were dispensed -We can do to discuss orthotics and offloading the next clinical visit.  No follow-ups on file.

## 2021-04-30 ENCOUNTER — Other Ambulatory Visit: Payer: Self-pay

## 2021-04-30 ENCOUNTER — Encounter: Payer: Self-pay | Admitting: Emergency Medicine

## 2021-04-30 ENCOUNTER — Emergency Department
Admission: EM | Admit: 2021-04-30 | Discharge: 2021-04-30 | Disposition: A | Discharge status: Home / Self Care | Payer: 59 | Attending: Emergency Medicine | Admitting: Emergency Medicine

## 2021-04-30 DIAGNOSIS — W503XXA Accidental bite by another person, initial encounter: Secondary | ICD-10-CM | POA: Insufficient documentation

## 2021-04-30 DIAGNOSIS — Y990 Civilian activity done for income or pay: Secondary | ICD-10-CM | POA: Insufficient documentation

## 2021-04-30 DIAGNOSIS — S21051A Open bite of right breast, initial encounter: Secondary | ICD-10-CM | POA: Diagnosis not present

## 2021-04-30 MED ORDER — NEOSPORIN PLUS PAIN RELIEF MS 3.5-10000-10 EX CREA
TOPICAL_CREAM | Freq: Two times a day (BID) | CUTANEOUS | 0 refills | Status: DC
  Filled 2021-04-30: qty 14.2, fill #0

## 2021-04-30 MED ORDER — AMOXICILLIN-POT CLAVULANATE 875-125 MG PO TABS
1.0000 | ORAL_TABLET | Freq: Two times a day (BID) | ORAL | 0 refills | Status: DC
  Filled 2021-04-30: qty 20, 10d supply, fill #0

## 2021-04-30 NOTE — Discharge Instructions (Addendum)
Read and follow discharge care instructions.  Apply topical medication as directed.
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
## 2021-05-27 ENCOUNTER — Other Ambulatory Visit: Payer: Self-pay

## 2021-05-27 ENCOUNTER — Ambulatory Visit (INDEPENDENT_AMBULATORY_CARE_PROVIDER_SITE_OTHER): Payer: 59 | Admitting: Podiatry

## 2021-05-27 ENCOUNTER — Encounter: Payer: Self-pay | Admitting: Podiatry

## 2021-05-27 DIAGNOSIS — L989 Disorder of the skin and subcutaneous tissue, unspecified: Secondary | ICD-10-CM

## 2021-05-27 NOTE — Progress Notes (Signed)
  Subjective:  Patient ID: Valerie Andrews, female    DOB: 27-Aug-1966,  MRN: [id number]  Chief Complaint  Patient presents with   Callouses    Bilateral callouses still causing discomfort     55 y.o. female presents with the above complaint.  Patient presents with bilateral submetatarsal 3 hyperkeratotic lesion/porokeratosis/benign skin lesion.  She would like to have them debrided down.  Painful to ambulate on.  He keeps coming back.  She denies any other acute complaints  Review of Systems: Negative except as noted in the HPI. Denies N/V/F/Ch.  Past Medical History:  Diagnosis Date   Arthritis    Chicken pox    GERD (gastroesophageal reflux disease)     Current Outpatient Medications:    cetirizine (ZYRTEC ALLERGY) 10 MG tablet, Take 1 tablet (10 mg total) by mouth daily for 10 days., Disp: 30 tablet, Rfl: 2   cetirizine (ZYRTEC) 10 MG tablet, TAKE 1 TABLET (10 MG TOTAL) BY MOUTH DAILY FOR 10 DAYS., Disp: 30 tablet, Rfl: 2   hydrochlorothiazide (HYDRODIURIL) 25 MG tablet, Take 1 tablet (25 mg total) by mouth daily., Disp: 30 tablet, Rfl: 2   moxifloxacin (VIGAMOX) 0.5 % ophthalmic solution, PLACE 1 DROP INTO THE RIGHT EYE 3 (THREE) TIMES DAILY FOR 7 DAYS., Disp: 3 mL, Rfl: 0   neomycin-polymyxin-pramoxine (NEOSPORIN PLUS) 1 % cream, Apply topically 2 (two) times daily., Disp: 14.2 g, Rfl: 0   traMADol (ULTRAM) 50 MG tablet, Take 1 tablet (50 mg total) by mouth every 6 (six) hours as needed., Disp: 15 tablet, Rfl: 0  Social History   Tobacco Use  Smoking Status Never  Smokeless Tobacco Never    Allergies  Allergen Reactions   Methocarbamol Other (See Comments)    headache   Objective:  There were no vitals filed for this visit. There is no height or weight on file to calculate BMI. Constitutional Well developed. Well nourished.  Vascular Dorsalis pedis pulses palpable bilaterally. Posterior tibial pulses palpable bilaterally. Capillary refill normal to all digits.  No  cyanosis or clubbing noted. Pedal hair growth normal.  Neurologic Normal speech. Oriented to person, place, and time. Epicritic sensation to light touch grossly present bilaterally.  Dermatologic N hyperkeratotic lesion without pinpoint bleeding noted.  Central nucleated core noted.  Pain on palpation to the skin lesion.  Orthopedic: Normal joint ROM without pain or crepitus bilaterally. No visible deformities. No bony tenderness.   Radiographs: None Assessment:   1. Benign skin lesion     Plan:  Patient was evaluated and treated and all questions answered.  Benign skin lesion bilateral submetatarsal 3 -I explained the patient the etiology of his benign skin lesion and various treatment options were discussed.  Given the amount of pain she is having I believe she would benefit from debridement of the lesion.  Using chisel blade to handle the lesion was debrided down to healthy striated tissue followed by excision of central nucleated core.  She had immediate relief.  No complication noted no pinpoint bleeding noted.  -She states that she will look into prior authorization for the insurance company to obtain orthotics.  No follow-ups on file.

## 2021-08-28 ENCOUNTER — Ambulatory Visit: Payer: Self-pay | Admitting: Podiatry

## 2021-10-10 ENCOUNTER — Other Ambulatory Visit: Payer: Self-pay

## 2021-10-10 ENCOUNTER — Emergency Department: Payer: Self-pay

## 2021-10-10 ENCOUNTER — Encounter: Payer: Self-pay | Admitting: Emergency Medicine

## 2021-10-10 ENCOUNTER — Emergency Department
Admission: EM | Admit: 2021-10-10 | Discharge: 2021-10-10 | Disposition: A | Discharge status: Home / Self Care | Payer: Self-pay | Attending: Emergency Medicine | Admitting: Emergency Medicine

## 2021-10-10 DIAGNOSIS — M1712 Unilateral primary osteoarthritis, left knee: Secondary | ICD-10-CM | POA: Insufficient documentation

## 2021-10-10 MED ORDER — MELOXICAM 15 MG PO TABS: 15.0000 mg | ORAL_TABLET | Freq: Every day | ORAL | 2 refills | Status: DC

## 2021-10-10 NOTE — ED Notes (Signed)
Dc ppw provided. Pt verbalized consent for dc. No questions at this time. Pt assisted off unit on foot. Followup information provided
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
## 2021-10-10 NOTE — Discharge Instructions (Signed)
Your x-ray shows that you have severe arthritis in the left knee.  Advised [facility].
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
## 2021-12-06 ENCOUNTER — Encounter: Payer: Self-pay | Admitting: Emergency Medicine

## 2021-12-06 ENCOUNTER — Emergency Department: Payer: No Typology Code available for payment source

## 2021-12-06 ENCOUNTER — Emergency Department
Admission: EM | Admit: 2021-12-06 | Discharge: 2021-12-06 | Disposition: A | Discharge status: Home / Self Care | Payer: No Typology Code available for payment source | Attending: Emergency Medicine | Admitting: Emergency Medicine

## 2021-12-06 ENCOUNTER — Other Ambulatory Visit: Payer: Self-pay

## 2021-12-06 DIAGNOSIS — N838 Other noninflammatory disorders of ovary, fallopian tube and broad ligament: Secondary | ICD-10-CM

## 2021-12-06 DIAGNOSIS — R102 Pelvic and perineal pain: Secondary | ICD-10-CM | POA: Diagnosis not present

## 2021-12-06 DIAGNOSIS — Z20822 Contact with and (suspected) exposure to covid-19: Secondary | ICD-10-CM | POA: Insufficient documentation

## 2021-12-06 DIAGNOSIS — R350 Frequency of micturition: Secondary | ICD-10-CM | POA: Insufficient documentation

## 2021-12-06 DIAGNOSIS — R1031 Right lower quadrant pain: Secondary | ICD-10-CM | POA: Insufficient documentation

## 2021-12-06 LAB — COMPREHENSIVE METABOLIC PANEL
ALT: 15 U/L (ref 0–44)
AST: 18 U/L (ref 15–41)
Albumin: 3.7 g/dL (ref 3.5–5.0)
Alkaline Phosphatase: 83 U/L (ref 38–126)
Anion gap: 5 (ref 5–15)
BUN: 18 mg/dL (ref 6–20)
CO2: 26 mmol/L (ref 22–32)
Calcium: 8.6 mg/dL — ABNORMAL LOW (ref 8.9–10.3)
Chloride: 106 mmol/L (ref 98–111)
Creatinine, Ser: 0.98 mg/dL (ref 0.44–1.00)
GFR, Estimated: >60 mL/min (ref >60)
Glucose, Bld: 73 mg/dL (ref 70–99)
Potassium: 3.7 mmol/L (ref 3.5–5.1)
Sodium: 137 mmol/L (ref 135–145)
Total Bilirubin: 0.6 mg/dL (ref 0.3–1.2)
Total Protein: 6.7 g/dL (ref 6.5–8.1)

## 2021-12-06 LAB — URINALYSIS, ROUTINE W REFLEX MICROSCOPIC
Bilirubin Urine: NEGATIVE
Glucose, UA: NEGATIVE mg/dL
Leukocytes,Ua: NEGATIVE
Nitrite: NEGATIVE
Protein, ur: NEGATIVE mg/dL
Specific Gravity, Urine: 1.025 (ref 1.005–1.030)
pH: 5.5 (ref 5.0–8.0)

## 2021-12-06 LAB — PREGNANCY, URINE: Preg Test, Ur: NEGATIVE

## 2021-12-06 LAB — CBC
HCT: 42.9 % (ref 36.0–46.0)
Hemoglobin: 13.6 g/dL (ref 12.0–15.0)
MCH: 23 pg — ABNORMAL LOW (ref 26.0–34.0)
MCHC: 31.7 g/dL (ref 30.0–36.0)
MCV: 72.5 fL — ABNORMAL LOW (ref 80.0–100.0)
Platelets: 178 10*3/uL (ref 150–400)
RBC: 5.92 MIL/uL — ABNORMAL HIGH (ref 3.87–5.11)
RDW: 15.2 % (ref 11.5–15.5)
WBC: 5.3 10*3/uL (ref 4.0–10.5)
nRBC: 0 % (ref 0.0–0.2)

## 2021-12-06 LAB — RESP PANEL BY RT-PCR (FLU A&B, COVID) ARPGX2
Influenza A by PCR: NEGATIVE
Influenza B by PCR: NEGATIVE
SARS Coronavirus 2 by RT PCR: NEGATIVE

## 2021-12-06 LAB — LIPASE, BLOOD: Lipase: 25 U/L (ref 11–51)

## 2021-12-06 MED ORDER — IOHEXOL 350 MG/ML SOLN
100.0000 mL | Freq: Once | INTRAVENOUS | Status: AC | PRN
  Administered 2021-12-06: 100 mL via INTRAVENOUS
  Filled 2021-12-06: qty 100

## 2021-12-06 MED ORDER — ACETAMINOPHEN 500 MG PO TABS
1000.0000 mg | ORAL_TABLET | Freq: Once | ORAL | Status: AC
  Administered 2021-12-06: 1000 mg via ORAL
  Filled 2021-12-06: qty 2

## 2021-12-06 MED ORDER — HYDROCODONE-ACETAMINOPHEN 5-325 MG PO TABS: 1.0000 | ORAL_TABLET | ORAL | 0 refills | Status: DC | PRN

## 2021-12-06 NOTE — Discharge Instructions (Addendum)
° °  IMPRESSION: 1. A 6.7 cm RIGHT adnexal mixed density mass is nonspecific. This may reflect a hemorrhagic cyst, ovarian cystic neoplasm or sequela of RIGHT ovarian torsion. Recommend further dedicated evaluation with pelvic ultrasound versus pelvic MRI and consideration of gynecologic consultation. 2. Appendix is unremarkable. 3. 2 mm left solid pulmonary nodule. No routine follow-up imaging is recommended per Fleischner Society Guidelines. These guidelines do not apply to immunocompromised patients and patients with cancer. Follow up in patients with significant comorbidities as clinically warranted. For lung cancer screening, adhere to Lung-RADS guidelines. Reference: Radiology. 2017; 284(1):228-43.   Please call the number for OB/GYN to arrange a follow-up appointment as soon as possible.  Please take your pain medication as needed, but only as prescribed.  Do not drive or drink alcohol while taking this medication.  Please take your entire course of antibiotics as prescribed for possible urinary tract infection.  Return to the emergency department for any significant worsening of pain or any other symptom personally concerning to yourself.

## 2021-12-06 NOTE — ED Triage Notes (Signed)
Pt via POV from home. Pt c/o RLQ abd pain that radiates to her R side that has been intermittent for the past couple of days but was worse this AM. Denies NVD. Denies fever. Pt states she is peeing more often. Pt is A&OX4 and NAD.

## 2021-12-06 NOTE — ED Provider Notes (Signed)
[facility] Provider Note    Event Date/Time   First MD Initiated Contact with Patient 12/06/21 1140     (approximate)   History   Abdominal Pain   HPI  Valerie Andrews is a 56 y.o. female with arthritis, GERD who comes in with right lower quadrant abdominal pain.  Patient reports some pain that radiates into her right side has been going on for the past couple days but is getting worse this morning.  Denies any other associated symptoms.  She does report a little bit of increased urination.  She denies any vaginal discharge or concern for new sexual partners.  She reports being with 1 partner for 40 years.  Denies any chest pain, shortness of breath or other concerns.  Patient does report that she is currently menstruating  Physical Exam   Triage Vital Signs: ED Triage Vitals  Enc Vitals Group     BP 12/06/21 1011 138/88     Pulse Rate 12/06/21 1011 94     Resp 12/06/21 1011 17     Temp 12/06/21 1011 98 F (36.7 C)     Temp Source 12/06/21 1011 Oral     SpO2 12/06/21 1011 95 %     Weight 12/06/21 1025 280 lb (127 kg)     Height 12/06/21 1025 5\' 7"  (1.702 m)     Head Circumference --      Peak Flow --      Pain Score 12/06/21 1025 8     Pain Loc --      Pain Edu? --      Excl. in [place]? --     Most recent vital signs: Vitals:   12/06/21 1011  BP: 138/88  Pulse: 94  Resp: 17  Temp: 98 F (36.7 C)  SpO2: 95%     General: Awake, no distress.  CV:  Good peripheral perfusion.  Resp:  Normal effort. Abd:  No distention.  Other:  Tender in the right mid to lower abdomen without rebound or guarding   ED Results / Procedures / Treatments   Labs (all labs ordered are listed, but only abnormal results are displayed) Labs Reviewed  COMPREHENSIVE METABOLIC PANEL - Abnormal; Notable for the following components:      Result Value   Calcium 8.6 (*)    All other components within normal limits  CBC - Abnormal; Notable for the following  components:   RBC 5.92 (*)    MCV 72.5 (*)    MCH 23.0 (*)    All other components within normal limits  URINALYSIS, ROUTINE W REFLEX MICROSCOPIC - Abnormal; Notable for the following components:   Hgb urine dipstick MODERATE (*)    Ketones, ur TRACE (*)    Bacteria, UA MANY (*)    All other components within normal limits  URINE CULTURE  LIPASE, BLOOD  POC URINE PREG, ED      RADIOLOGY I have reviewed the CT personally and agree with radiology read pt with right adnexal mixed density.  Appendix is normal.  Patient has incidentally noted nodule which can follow-up with her PCP   PROCEDURES:  Critical Care performed: No  Procedures   MEDICATIONS ORDERED IN ED: Medications  acetaminophen (TYLENOL) tablet 1,000 mg (has no administration in time range)     IMPRESSION / MDM / ASSESSMENT AND PLAN / ED COURSE  I reviewed the triage vital signs and the nursing notes.   Differential diagnosis includes, but is not limited to,  appendicitis, diverticulitis, kindey stone, perforation. Will get CT to evaluate.  Pt is driving home so will start with tylenol for pain.    Cbc norml white count  Cmp normal kidneys Lipase normal  I also discussed incidental nodule on the CT and given copy of report  CT scan concerning for mass versus torsion.  Discussed the case with Dr. Glennon Mac from OB/GYN so they are aware in case this was ovarian torsion.  They recommended getting the ultrasound because if this is positive patient will need immediate surgery but if is more concerning for a mass that she would need outpatient follow-up with MRI to further evaluate/oncology.  Patient will be handed off to oncoming team pending these results.  Patient is aware of these concerns as well.      FINAL CLINICAL IMPRESSION(S) / ED DIAGNOSES   Final diagnoses:  Right lower quadrant pain     Rx / DC Orders   ED Discharge Orders     None        Note:  This document was prepared using Dragon  voice recognition software and may include unintentional dictation errors.   Vanessa Sheffield, MD 12/06/21 1430

## 2021-12-06 NOTE — ED Provider Notes (Signed)
----------------------------------------- °  4:43 PM on 12/06/2021 ----------------------------------------- Patient care assumed from Dr. Jari Pigg.  Patient's ultrasound is most consistent with ovarian mass.  I spoke to Dr. Glennon Mac of OB/GYN who recommends obtaining a ROMA blood test to evaluate ovarian tumor markers.  He will follow-up in the office early this week for likely referral to gynecology oncology for further work-up treatment and likely removal.  I spoke to the patient regarding this plan of care she is agreeable.  We will discharge her with pain medication to be used if needed as well as a short course of antibiotics for possible urinary tract infection given many bacteria in the urine.  Patient agreeable plan of care.   Harvest Dark, MD 12/06/21 [phone]

## 2021-12-08 LAB — OVARIAN MALIGNANCY RISK-ROMA
Cancer Antigen (CA) 125: 13.3 U/mL (ref 0.0–38.1)
HE4: 69.9 pmol/L (ref 0.0–105.2)
Postmenopausal ROMA: 1.44
Premenopausal ROMA: 1.51 — ABNORMAL HIGH

## 2021-12-08 LAB — POSTMENOPAUSAL INTERP: LOW

## 2021-12-08 LAB — PREMENOPAUSAL INTERP: HIGH

## 2021-12-09 LAB — URINE CULTURE: Culture: >=100000 — AB

## 2022-01-05 ENCOUNTER — Encounter (HOSPITAL_COMMUNITY): Payer: Self-pay | Admitting: Radiology

## 2022-01-07 DIAGNOSIS — Z124 Encounter for screening for malignant neoplasm of cervix: Secondary | ICD-10-CM | POA: Diagnosis not present

## 2022-01-07 DIAGNOSIS — Z113 Encounter for screening for infections with a predominantly sexual mode of transmission: Secondary | ICD-10-CM | POA: Diagnosis not present

## 2022-01-07 DIAGNOSIS — R69 Illness, unspecified: Secondary | ICD-10-CM | POA: Diagnosis not present

## 2022-01-07 DIAGNOSIS — N83201 Unspecified ovarian cyst, right side: Secondary | ICD-10-CM | POA: Diagnosis not present

## 2022-01-14 ENCOUNTER — Other Ambulatory Visit: Payer: Self-pay

## 2022-01-14 ENCOUNTER — Inpatient Hospital Stay: Payer: 59 | Attending: Obstetrics and Gynecology | Admitting: Obstetrics and Gynecology

## 2022-01-14 VITALS — BP 148/86 | HR 77 | Temp 98.7°F | Resp 20 | Wt 291.7 lb

## 2022-01-14 DIAGNOSIS — Z6841 Body Mass Index (BMI) 40.0 and over, adult: Secondary | ICD-10-CM | POA: Diagnosis not present

## 2022-01-14 DIAGNOSIS — Z780 Asymptomatic menopausal state: Secondary | ICD-10-CM | POA: Insufficient documentation

## 2022-01-14 DIAGNOSIS — K219 Gastro-esophageal reflux disease without esophagitis: Secondary | ICD-10-CM | POA: Insufficient documentation

## 2022-01-14 DIAGNOSIS — Z9889 Other specified postprocedural states: Secondary | ICD-10-CM | POA: Insufficient documentation

## 2022-01-14 DIAGNOSIS — R911 Solitary pulmonary nodule: Secondary | ICD-10-CM | POA: Insufficient documentation

## 2022-01-14 DIAGNOSIS — R978 Other abnormal tumor markers: Secondary | ICD-10-CM | POA: Insufficient documentation

## 2022-01-14 DIAGNOSIS — Z79899 Other long term (current) drug therapy: Secondary | ICD-10-CM | POA: Insufficient documentation

## 2022-01-14 DIAGNOSIS — D3911 Neoplasm of uncertain behavior of right ovary: Secondary | ICD-10-CM | POA: Insufficient documentation

## 2022-01-14 DIAGNOSIS — Z791 Long term (current) use of non-steroidal anti-inflammatories (NSAID): Secondary | ICD-10-CM | POA: Diagnosis not present

## 2022-01-14 DIAGNOSIS — M170 Bilateral primary osteoarthritis of knee: Secondary | ICD-10-CM | POA: Diagnosis not present

## 2022-01-14 DIAGNOSIS — N9489 Other specified conditions associated with female genital organs and menstrual cycle: Secondary | ICD-10-CM

## 2022-01-14 NOTE — Progress Notes (Signed)
Gynecologic Oncology Consult Visit   Referring Provider: Prentice Docker, MD  Chief Concern: right adnexal mass  Subjective:  Valerie Andrews is a 56 y.o. [id number] who is seen in consultation from Dr. JGlennon Macfor RIGHT adnexal mixed density mass.    She presented to the ER on 12/06/2021 with Pt c/o RLQ abd pain that radiates to her R side that has been intermittent for the past couple of days but was worse.   12/06/2021 CT abdomen and pelvis     IMPRESSION: 1. A 6.7 cm RIGHT adnexal mixed density mass is nonspecific. This may reflect a hemorrhagic cyst, ovarian cystic neoplasm or sequela of RIGHT ovarian torsion. Recommend further dedicated evaluation with pelvic ultrasound versus pelvic MRI and consideration of gynecologic consultation. 2. Appendix is unremarkable. 3. 2 mm left solid pulmonary nodule. No routine follow-up imaging is recommended per Fleischner Society Guidelines. These guidelines do not apply to immunocompromised patients and patients with cancer. Follow up in patients with significant comorbidities as clinically warranted. For lung cancer screening, adhere to Lung-RADS guidelines. Reference: Radiology. 2017; 284(1):228-43.   12/06/2021 Pelvic [place] Right ovary: Measurements: 5.5 x 4.5 x 4.1 cm = volume: 52 mL. There appears to be a predominantly solid but complex and multi septated mass within the right ovary. It appears to have blood flow. Left ovary Not visualized. IMPRESSION: Probable predominantly solid but complex and multi septated mass within the right ovary measuring at least 5 cm. Further evaluation with MRI with and without gadolinium is recommended to evaluate for possible ovarian neoplasm.    Patient's ultrasound is most consistent with ovarian mass.  I spoke to Dr. JGlennon Macof OB/GYN who recommends obtaining a ROMA blood test to evaluate ovarian tumor markers.  He will follow-up in the office early this week for likely referral to gynecology oncology    ROMA:  If the patient is postmenopausal, then the postmenopausal ROMA score  of less than 2.99 is consistent with a low likelihood of finding  a malignancy on surgery.  Cancer Antigen (CA) 125 0.0 - 38.1 U/mL 13.3   Comment: (NOTE)  Roche [employer]ostics Electrochemiluminescence Immunoassay (ECLIA)  Values obtained with different assay methods or kits cannot be  used interchangeably.  Results cannot be interpreted as absolute  evidence of the presence or absence of malignant disease.   HE4 0.0 - 105.2 pmol/L 69.9    Premenopausal Interp: HIGH Comment   Comment: (NOTE)  If the patient is premenopausal, then the premenopausal ROMA score  of greater than or equal to 1.14 is consistent with a high  likelihood of finding a malignancy on surgery.    Her pain has now completely resolved. The pain was described as intermittent over the course is ~24-36 hours. She would prefer to avoid surgery if possible. She is still having menstrual cycles and does not have c/o abnormal vaginal bleeding.   Problem List: Patient Active Problem List   Diagnosis Date Noted   Esophageal reflux 04/25/2015   Arthritis of both knees 04/25/2015   OB History     Gravida  3   Para  1   Term      Preterm      AB  2   Living         SAB  2   IAB      Ectopic      Multiple      Live Births              Past Medical  History: Past Medical History:  Diagnosis Date   Arthritis    Chicken pox    GERD (gastroesophageal reflux disease)     Past Surgical History: History reviewed. No pertinent surgical history.  Past Gynecologic History:  Menarche: 13 Menstrual details: Lasts 5 days Menses regular: yes Last Menstrual Period: Unknown History of Abnormal pap: no,  Last pap: NILM; HRHPV negative 01/07/2022 History of STDs: trichomonas 2017 Contraception: None   OB History:  OB History  Gravida Para Term Preterm AB Living  '3 1     2    '$ SAB IAB Ectopic Multiple Live Births  2             # Outcome Date GA Lbr Len/2nd Weight Sex Delivery Anes PTL Lv  3 SAB           2 SAB           1 Para             Family History: Family History  Problem Relation Age of Onset   Hypertension Mother    Stroke Father    Diabetes Paternal Grandmother    Cancer Neg Hx    Breast cancer Neg Hx     Social History: Social History   Socioeconomic History   Marital status: Single    Spouse name: Not on file   Number of children: Not on file   Years of education: Not on file   Highest education level: Not on file  Occupational History   Not on file  Tobacco Use   Smoking status: Never   Smokeless tobacco: Never  Substance and Sexual Activity   Alcohol use: No    Alcohol/week: 0.0 standard drinks   Drug use: No   Sexual activity: Not Currently    Birth control/protection: None  Other Topics Concern   Not on file  Social History Narrative   Not on file   Social Determinants of Health   Financial Resource Strain: Not on file  Food Insecurity: Not on file  Transportation Needs: Not on file  Physical Activity: Not on file  Stress: Not on file  Social Connections: Not on file  Intimate Partner Violence: Not on file    Allergies: Allergies  Allergen Reactions   Methocarbamol Other (See Comments)    headache    Current Medications: Current Outpatient Medications  Medication Sig Dispense Refill   cetirizine (ZYRTEC ALLERGY) 10 MG tablet Take 1 tablet (10 mg total) by mouth daily for 10 days. 30 tablet 2   cetirizine (ZYRTEC) 10 MG tablet TAKE 1 TABLET (10 MG TOTAL) BY MOUTH DAILY FOR 10 DAYS. 30 tablet 2   ibuprofen (ADVIL) 200 MG tablet Take by mouth.     hydrochlorothiazide (HYDRODIURIL) 25 MG tablet Take 1 tablet (25 mg total) by mouth daily. (Patient not taking: Reported on 01/14/2022) 30 tablet 2   HYDROcodone-acetaminophen (NORCO/VICODIN) 5-325 MG tablet Take 1 tablet by mouth every 4 (four) hours as needed for moderate pain. (Patient not taking: Reported on  01/14/2022) 20 tablet 0   meloxicam (MOBIC) 15 MG tablet Take 1 tablet (15 mg total) by mouth daily. (Patient not taking: Reported on 01/14/2022) 30 tablet 2   moxifloxacin (VIGAMOX) 0.5 % ophthalmic solution PLACE 1 DROP INTO THE RIGHT EYE 3 (THREE) TIMES DAILY FOR 7 DAYS. (Patient not taking: Reported on 01/14/2022) 3 mL 0   neomycin-polymyxin-pramoxine (NEOSPORIN PLUS) 1 % cream Apply topically 2 (two) times daily. (Patient not taking: Reported on 01/14/2022) 14.2  g 0   traMADol (ULTRAM) 50 MG tablet Take 1 tablet (50 mg total) by mouth every 6 (six) hours as needed. (Patient not taking: Reported on 01/14/2022) 15 tablet 0   No current facility-administered medications for this visit.    Review of Systems General: negative for fevers, changes in weight or night sweats Skin: negative for changes in moles or sores or rash Eyes: negative for changes in vision HEENT: negative for change in hearing, tinnitus, voice changes Pulmonary: negative for dyspnea, orthopnea, productive cough, wheezing Cardiac: negative for palpitations, pain Gastrointestinal: negative for nausea, vomiting, constipation, diarrhea, hematemesis, hematochezia Genitourinary/Sexual: negative for dysuria, retention, hematuria, incontinence Ob/Gyn:  negative for abnormal bleeding, or pain Musculoskeletal: leg pain o/w negative for joint pain, back pain Hematology: negative for easy bruising, abnormal bleeding Neurologic/Psych: headaches o/w negative for , seizures, paralysis, weakness, numbness   Objective:  Physical Examination:  BP (!) 148/86    Pulse 77    Temp 98.7 F (37.1 C)    Resp 20    Wt 291 lb 11.2 oz (132.3 kg)    SpO2 100%    BMI 45.69 kg/m    ECOG Performance Status: 0 - Asymptomatic  GENERAL: Patient is a well appearing female in no acute distress HEENT:  PERRL, neck supple with midline trachea.  NODES:  No cervical, supraclavicular, axillary, or inguinal lymphadenopathy palpated.  LUNGS:  Clear to auscultation  bilaterally.   HEART:  Regular rate and rhythm.  ABDOMEN:  Soft, nontender, nondisteneded. No ascites, masses, or hernias.  EXTREMITIES:  No peripheral edema.   NEURO:  Nonfocal. Well oriented.  Appropriate affect.  Pelvic chaperoned by CMA;  Vulva: normal appearing vulva with no masses, tenderness or lesions; Vagina: normal vagina/ Cervix, no lesions. Uterus; unable to determine size due to habitus, nontended. Adnexa/Bimanual: mass 5-6 cm palpable nontender soft right adnexa, unable to assess for mobility, left adnexa not enlarged but exam limited by habitus.  Rectal: deferred    Lab Review 12/06/2021 Lab Results  Component Value Date   WBC 5.3 12/06/2021   HGB 13.6 12/06/2021   HCT 42.9 12/06/2021   MCV 72.5 (L) 12/06/2021   PLT 178 12/06/2021     Chemistry      Component Value Date/Time   NA 137 12/06/2021 1012   K 3.7 12/06/2021 1012   CL 106 12/06/2021 1012   CO2 26 12/06/2021 1012   BUN 18 12/06/2021 1012   CREATININE 0.98 12/06/2021 1012      Component Value Date/Time   CALCIUM 8.6 (L) 12/06/2021 1012   ALKPHOS 83 12/06/2021 1012   AST 18 12/06/2021 1012   ALT 15 12/06/2021 1012   BILITOT 0.6 12/06/2021 1012     Pregnancy test negative Lipase 25 WNL  Radiologic Imaging: 12/06/2021 CT A/P TECHNIQUE: Multidetector CT imaging of the abdomen and pelvis was performed using the standard protocol following bolus administration of intravenous contrast.   RADIATION DOSE REDUCTION: This exam was performed according to the departmental dose-optimization program which includes automated exposure control, adjustment of the mA and/or kV according to patient size and/or use of iterative reconstruction technique.   CONTRAST:  133m OMNIPAQUE IOHEXOL 350 MG/ML SOLN   COMPARISON:  None.   FINDINGS: Lower chest: LEFT lower lobe pulmonary nodule measures 2 mm (series 4, image 10.   Hepatobiliary: No focal liver abnormality is seen. No gallstones, gallbladder wall  thickening, or biliary dilatation.   Pancreas: Unremarkable. No pancreatic ductal dilatation or surrounding inflammatory changes.   Spleen: There  is a 13 mm round cystic density of the splenic hilum, nonspecific but likely a splenic cyst or hemangioma.   Adrenals/Urinary Tract: Adrenal glands are unremarkable. Kidneys enhance symmetrically. No hydronephrosis. No obstructing nephrolithiasis. Bladder is decompressed.   Stomach/Bowel: No evidence of bowel obstruction. Extensive diverticulosis throughout the colon without evidence of acute diverticulitis. Appendix is normal in appearance. Tip terminates near the region of the RIGHT adnexal mass. Small hiatal hernia. Duodenal diverticulum.   Vascular/Lymphatic: Aorta is normal in course and caliber. No suspicious lymphadenopathy identified.   Reproductive: In the RIGHT adnexa, there is a 6.7 x 5.5 x 5.1 cm mixed density mass which contains a combination of low level cystic density and intermediate density. LEFT ovary is unremarkable. Uterus is unremarkable.   Other: No free air.   Musculoskeletal: No acute osseous abnormality.   IMPRESSION: 1. A 6.7 cm RIGHT adnexal mixed density mass is nonspecific. This may reflect a hemorrhagic cyst, ovarian cystic neoplasm or sequela of RIGHT ovarian torsion. Recommend further dedicated evaluation with pelvic ultrasound versus pelvic MRI and consideration of gynecologic consultation. 2. Appendix is unremarkable. 3. 2 mm left solid pulmonary nodule. No routine follow-up imaging is recommended per Fleischner Society Guidelines. These guidelines do not apply to immunocompromised patients and patients with cancer. Follow up in patients with significant comorbidities as clinically warranted. For lung cancer screening, adhere to Lung-RADS guidelines. Reference: Radiology. 2017; 284(1):228-43.  12/06/2021 Pelvic US FINDINGS: Uterus   Measurements: 6.6 x 4.6 x 3.9 cm = volume: 62 mL. No  fibroids or other mass visualized.   Endometrium   Thickness: 5 mm which is within normal limits. No focal abnormality visualized.   Right ovary   Measurements: 5.5 x 4.5 x 4.1 cm = volume: 52 mL. There appears to be a predominantly solid but complex and multi septated mass within the right ovary. It appears to have blood flow.   Left ovary   Not visualized.   Pulsed Doppler evaluation of right ovary demonstrates normal low-resistance arterial and venous waveforms.   Other findings   No abnormal free fluid.   IMPRESSION: Probable predominantly solid but complex and multi septated mass within the right ovary measuring at least 5 cm. Further evaluation with MRI with and without gadolinium is recommended to evaluate for possible ovarian neoplasm.   Left ovary is not visualized.   Assessment:  Valerie Andrews is a 56 y.o. female diagnosed with complex right adnexal mass with normal CA125 and HE4, but elevated ROMA score based on premenopausal status. She is not currently symptomatic. She would like to avoid surgery.  Differential diagnosis includes benign etiology (ie. endometrioma) or malignancy with probably episode of intermittent torsion.   Medical co-morbidities complicating care: obesity and prior abdominal surgery.  Plan:   Problem List Items Addressed This Visit   None Visit Diagnoses     Adnexal mass    -  Primary   Relevant Orders   MR PELVIS W WO CONTRAST   Follicle stimulating hormone       We discussed options for management, we recommend definitive surgical evaluation versus repeat imaging with MRI for further characterization and FSH. She will see Dr. Glennon Mac after the MRI and to determine subsequent management.   Suggested return to clinic pending MRI and [facility] results.   The patient's diagnosis, an outline of the further diagnostic and laboratory studies which will be required, the recommendation, and alternatives were discussed.  All questions were  answered to the patient's satisfaction.  A total of  60 minutes were spent with the patient/family today; >50% was spent in education, counseling and coordination of care for omplex right adnexal mass with normal CA125 and HE4, but elevated ROMA score. Ms. Doepke left before we could complete our management counseling. We have tried to reach her several times via telephone and I left 2 messages. Mariea Clonts, RN will also try to reach her. Beckey Rutter, NP will notify Dr. Marisue Brooklyn office when she has the MRI ordered.   I personally had a face to face interaction and evaluated the patient jointly with the NP, Ms. Beckey Rutter.  I have reviewed her history and available records and have performed the key portions of the physical exam including HEENT, general, abdominal exam, pelvic exam with my findings confirming those documented above by the APP.  I have discussed the case with the APP and the patient.  I agree with the above documentation, assessment and plan which was fully formulated by me.  Counseling was completed by me.   I personally saw the patient and performed a substantive portion of this encounter in conjunction with the listed APP as documented above.  Odena Mcquaid Gaetana Michaelis, MD      CC:  Will Bonnet,  MD [address] [place],  [place] [id number] [phone]

## 2022-01-19 ENCOUNTER — Telehealth: Payer: Self-pay | Admitting: *Deleted

## 2022-01-19 NOTE — Telephone Encounter (Signed)
The scheduler for radiology updated the date of this patient's appointment. ?

## 2022-01-19 NOTE — Telephone Encounter (Signed)
01/19/2022 ?Left VM to update pt  ?srw ?

## 2022-01-22 ENCOUNTER — Inpatient Hospital Stay: Payer: 59

## 2022-01-22 ENCOUNTER — Ambulatory Visit: Payer: No Typology Code available for payment source | Admitting: Family Medicine

## 2022-01-22 ENCOUNTER — Other Ambulatory Visit: Payer: Self-pay

## 2022-01-22 ENCOUNTER — Encounter: Payer: Self-pay | Admitting: *Deleted

## 2022-01-22 DIAGNOSIS — R978 Other abnormal tumor markers: Secondary | ICD-10-CM | POA: Diagnosis not present

## 2022-01-22 DIAGNOSIS — Z780 Asymptomatic menopausal state: Secondary | ICD-10-CM | POA: Diagnosis not present

## 2022-01-22 DIAGNOSIS — K219 Gastro-esophageal reflux disease without esophagitis: Secondary | ICD-10-CM | POA: Diagnosis not present

## 2022-01-22 DIAGNOSIS — D3911 Neoplasm of uncertain behavior of right ovary: Secondary | ICD-10-CM | POA: Diagnosis not present

## 2022-01-22 DIAGNOSIS — Z6841 Body Mass Index (BMI) 40.0 and over, adult: Secondary | ICD-10-CM | POA: Diagnosis not present

## 2022-01-22 DIAGNOSIS — Z791 Long term (current) use of non-steroidal anti-inflammatories (NSAID): Secondary | ICD-10-CM | POA: Diagnosis not present

## 2022-01-22 DIAGNOSIS — Z9889 Other specified postprocedural states: Secondary | ICD-10-CM | POA: Diagnosis not present

## 2022-01-22 DIAGNOSIS — M170 Bilateral primary osteoarthritis of knee: Secondary | ICD-10-CM | POA: Diagnosis not present

## 2022-01-22 DIAGNOSIS — R911 Solitary pulmonary nodule: Secondary | ICD-10-CM | POA: Diagnosis not present

## 2022-01-23 LAB — FOLLICLE STIMULATING HORMONE: FSH: 3.1 m[IU]/mL

## 2022-01-27 ENCOUNTER — Ambulatory Visit: Payer: No Typology Code available for payment source

## 2022-01-29 ENCOUNTER — Ambulatory Visit
Admission: RE | Admit: 2022-01-29 | Discharge: 2022-01-29 | Disposition: A | Discharge status: Home / Self Care | Payer: 59 | Source: Ambulatory Visit | Attending: Nurse Practitioner | Admitting: Nurse Practitioner

## 2022-01-29 DIAGNOSIS — N9489 Other specified conditions associated with female genital organs and menstrual cycle: Secondary | ICD-10-CM | POA: Insufficient documentation

## 2022-01-29 DIAGNOSIS — N83201 Unspecified ovarian cyst, right side: Secondary | ICD-10-CM | POA: Diagnosis not present

## 2022-01-29 DIAGNOSIS — K5730 Diverticulosis of large intestine without perforation or abscess without bleeding: Secondary | ICD-10-CM | POA: Diagnosis not present

## 2022-01-29 DIAGNOSIS — R1909 Other intra-abdominal and pelvic swelling, mass and lump: Secondary | ICD-10-CM | POA: Diagnosis not present

## 2022-01-29 MED ORDER — GADOBUTROL 1 MMOL/ML IV SOLN
10.0000 mL | Freq: Once | INTRAVENOUS | Status: AC | PRN
  Administered 2022-01-29: 10 mL via INTRAVENOUS

## 2022-02-02 ENCOUNTER — Telehealth: Payer: Self-pay

## 2022-02-02 ENCOUNTER — Telehealth: Payer: Self-pay | Admitting: *Deleted

## 2022-02-02 NOTE — Telephone Encounter (Signed)
Called report ?IMPRESSION: ?1. Mixed solid cystic mass of the right ovary measuring 7.1 x 6.9 x ?4.9 cm, highly concerning for primary ovarian malignancy. ?2. Normal left ovary. ?3. No evidence of pelvic lymphadenopathy or metastatic disease. ?4. Descending and sigmoid diverticulosis. ?  ?These results will be called to the ordering clinician or ?representative by the Radiologist Assistant, and communication ?documented in the PACS or [employer]. ?  ?  ?Electronically Signed ?  By: Delanna Ahmadi M.D. ?  On: 01/30/2022 17:21 ?  ?

## 2022-02-02 NOTE — Telephone Encounter (Signed)
MRI has resulted. Called and left voicemail with Ms. Hillmann to return call regarding results. Appointment for gyn oncology arranged for 02/04/22 at 1430.  ?

## 2022-02-03 ENCOUNTER — Telehealth: Payer: Self-pay

## 2022-02-03 ENCOUNTER — Telehealth: Payer: Self-pay | Admitting: *Deleted

## 2022-02-03 NOTE — Telephone Encounter (Signed)
Patient returned phone call and left msg on scheduling line to inquire time of her apt tom. I attempted to reach patient back. Left vm that apt was tomorrow 3/29 at 230 pm ?

## 2022-02-03 NOTE — Telephone Encounter (Signed)
Second call placed to Valerie Andrews to discuss MRI results and notify of appointment that has been arranged 02/04/22 with gyn oncology. No answer, left voicemail. ?

## 2022-02-04 ENCOUNTER — Inpatient Hospital Stay (HOSPITAL_BASED_OUTPATIENT_CLINIC_OR_DEPARTMENT_OTHER): Payer: 59 | Admitting: Obstetrics and Gynecology

## 2022-02-04 VITALS — BP 147/93 | HR 79 | Temp 98.3°F | Resp 17 | Ht 67.0 in | Wt 297.0 lb

## 2022-02-04 DIAGNOSIS — Z791 Long term (current) use of non-steroidal anti-inflammatories (NSAID): Secondary | ICD-10-CM | POA: Diagnosis not present

## 2022-02-04 DIAGNOSIS — M170 Bilateral primary osteoarthritis of knee: Secondary | ICD-10-CM | POA: Diagnosis not present

## 2022-02-04 DIAGNOSIS — N9489 Other specified conditions associated with female genital organs and menstrual cycle: Secondary | ICD-10-CM | POA: Insufficient documentation

## 2022-02-04 DIAGNOSIS — D3911 Neoplasm of uncertain behavior of right ovary: Secondary | ICD-10-CM

## 2022-02-04 DIAGNOSIS — Z780 Asymptomatic menopausal state: Secondary | ICD-10-CM | POA: Diagnosis not present

## 2022-02-04 DIAGNOSIS — Z9889 Other specified postprocedural states: Secondary | ICD-10-CM | POA: Diagnosis not present

## 2022-02-04 DIAGNOSIS — K219 Gastro-esophageal reflux disease without esophagitis: Secondary | ICD-10-CM | POA: Diagnosis not present

## 2022-02-04 DIAGNOSIS — Z6841 Body Mass Index (BMI) 40.0 and over, adult: Secondary | ICD-10-CM | POA: Diagnosis not present

## 2022-02-04 DIAGNOSIS — R911 Solitary pulmonary nodule: Secondary | ICD-10-CM | POA: Diagnosis not present

## 2022-02-04 DIAGNOSIS — R978 Other abnormal tumor markers: Secondary | ICD-10-CM | POA: Diagnosis not present

## 2022-02-04 NOTE — Progress Notes (Signed)
Gynecologic Oncology Consult Visit  ? ?Referring Provider: ?Prentice Docker, MD ? ?Chief Concern: right adnexal mass concerning for cancer ? ?Subjective:  ?Valerie Andrews is a 56 y.o. [id number] who is seen in consultation from Dr. JGlennon Macfor RIGHT adnexal mixed density mass.  ? ?Patient returns today to discuss MRI findings in view of her right ovarian mass.   ? ?MRI pelvis 01/30/22 ?FINDINGS: ?Urinary Tract:  No abnormality visualized. ?Bowel:  Descending and sigmoid diverticulosis. ?Vascular/Lymphatic: No pathologically enlarged lymph nodes. No ?significant vascular abnormality seen. ?  ?Reproductive: Mixed solid cystic mass of the right ovary measuring ?7.1 x 6.9 x 4.9 cm, with heterogeneous associated contrast ?enhancement (series 3, image 15). The left ovary is normal in size ?and appearance. ?  ?Other:  None. ?  ?Musculoskeletal: No suspicious bone lesions identified. ?  ?IMPRESSION: ?1. Mixed solid cystic mass of the right ovary measuring 7.1 x 6.9 x ?4.9 cm, highly concerning for primary ovarian malignancy. ?2. Normal left ovary. ?3. No evidence of pelvic lymphadenopathy or metastatic disease. ?4. Descending and sigmoid diverticulosis. ? ?Gyn Oncology history ?She presented to the ER on 12/06/2021 with Pt c/o RLQ abd pain that radiates to her R side that has been intermittent for the past couple of days but was worse.  ? ?12/06/2021 CT abdomen and pelvis   ?  ?IMPRESSION: ?1. A 6.7 cm RIGHT adnexal mixed density mass is nonspecific. This may reflect a hemorrhagic cyst, ovarian cystic neoplasm or sequela of RIGHT ovarian torsion. Recommend further dedicated evaluation ?with pelvic ultrasound versus pelvic MRI and consideration of ?gynecologic consultation. ?2. Appendix is unremarkable. ?3. 2 mm left solid pulmonary nodule. No routine follow-up imaging is ?recommended per Fleischner Society Guidelines. ?These guidelines do not apply to immunocompromised patients and ?patients with cancer. Follow up in  patients with significant ?comorbidities as clinically warranted. For lung cancer screening, ?adhere to Lung-RADS guidelines. Reference: Radiology. 2017; ?284(1):228-43. ? ? ?12/06/2021 Pelvic [place]? Right ovary: Measurements: 5.5 x 4.5 x 4.1 cm = volume: 52 mL. There appears to ?be a predominantly solid but complex and multi septated mass within the right ovary. It appears to have blood flow. ?Left ovary Not visualized. ?IMPRESSION: Probable predominantly solid but complex and multi septated mass within the right ovary measuring at least 5 cm. Further evaluation with MRI with and without gadolinium is recommended to evaluate for possible ovarian neoplasm. ?  ? ?Patient's ultrasound is most consistent with ovarian mass.  I spoke to Dr. JGlennon Macof OB/GYN who recommends obtaining a ROMA blood test to evaluate ovarian tumor markers.  He will follow-up in the office early this week for likely referral to gynecology oncology  ? ?ROMA:  ?If the patient is postmenopausal, then the postmenopausal ROMA score  ?of less than 2.99 is consistent with a low likelihood of finding  ?a malignancy on surgery. ? ?Cancer Antigen (CA) 125 0.0 - 38.1 U/mL 13.3   ?Comment: (NOTE)  ?Roche Diagnostics Electrochemiluminescence Immunoassay ([facility]  ?Values obtained with different assay methods or kits cannot be  ?used interchangeably.  Results cannot be interpreted as absolute  ?evidence of the presence or absence of malignant disease.   ?HE4 0.0 - 105.2 pmol/L 69.9   ? ?Premenopausal Interp: HIGH Comment   ?Comment: (NOTE)  ?If the patient is premenopausal, then the premenopausal ROMA score  ?of greater than or equal to 1.14 is consistent with a high  ?likelihood of finding a malignancy on surgery.   ? ?Her pain has now completely resolved. The  pain was described as intermittent over the course is ~24-36 hours. She would prefer to avoid surgery if possible. She is still having menstrual cycles and does not have c/o abnormal vaginal bleeding.   ? ?Problem List: ?Patient Active Problem List  ? Diagnosis Date Noted  ? Esophageal reflux 04/25/2015  ? Arthritis of both knees 04/25/2015  ? ?OB History   ? ? Gravida  ?3  ? Para  ?1  ? Term  ?   ? Preterm  ?   ? AB  ?2  ? Living  ?   ?  ? ? SAB  ?2  ? IAB  ?   ? Ectopic  ?   ? Multiple  ?   ? Live Births  ?   ?   ?  ?  ? ? ?Past Medical History: ?Past Medical History:  ?Diagnosis Date  ? Arthritis   ? BMI 45.0-49.9, adult ([place])   ? Chicken pox   ? GERD (gastroesophageal reflux disease)   ? ? ?Past Surgical History: ?Past Surgical History:  ?Procedure Laterality Date  ? SALPINGECTOMY Left   ? ectopic  ? ? ?Past Gynecologic History:  ?Menarche: 21 ?Menstrual details: Lasts 5 days ?Menses regular: yes ?Last Menstrual Period: Unknown ?History of Abnormal pap: no,  ?Last pap: NILM; HRHPV negative 01/07/2022 ?History of STDs: trichomonas 2017 ?Contraception: None ? ? ?OB History:  ?OB History  ?Gravida Para Term Preterm AB Living  ?'3 1     2    '$ ?SAB IAB Ectopic Multiple Live Births  ?2          ?  ?# Outcome Date GA Lbr Len/2nd Weight Sex Delivery Anes PTL Lv  ?3 SAB           ?2 SAB           ?1 Para           ? ? ?Family History: ?Family History  ?Problem Relation Age of Onset  ? Hypertension Mother   ? Stroke Father   ? Diabetes Paternal Grandmother   ? Cancer Neg Hx   ? Breast cancer Neg Hx   ? ? ?Social History: ?Social History  ? ?Socioeconomic History  ? Marital status: Single  ?  Spouse name: Not on file  ? Number of children: Not on file  ? Years of education: Not on file  ? Highest education level: Not on file  ?Occupational History  ? Not on file  ?Tobacco Use  ? Smoking status: Never  ? Smokeless tobacco: Never  ?Substance and Sexual Activity  ? Alcohol use: No  ?  Alcohol/week: 0.0 standard drinks  ? Drug use: No  ? Sexual activity: Not Currently  ?  Birth control/protection: None  ?Other Topics Concern  ? Not on file  ?Social History Narrative  ? Not on file  ? ?Social Determinants of Health  ? ?Financial  Resource Strain: Not on file  ?Food Insecurity: Not on file  ?Transportation Needs: Not on file  ?Physical Activity: Not on file  ?Stress: Not on file  ?Social Connections: Not on file  ?Intimate Partner Violence: Not on file  ? ? ?Allergies: ?Allergies  ?Allergen Reactions  ? Methocarbamol Other (See Comments)  ?  headache  ? Tramadol   ?  Other reaction(s): Other (See Comments) ?Increased pain and irritability   ? ? ?Current Medications: ?Current Outpatient Medications  ?Medication Sig Dispense Refill  ? cetirizine (ZYRTEC) 10 MG tablet TAKE 1 TABLET (10 MG  TOTAL) BY MOUTH DAILY FOR 10 DAYS. 30 tablet 2  ? ibuprofen (ADVIL) 200 MG tablet Take by mouth.    ? cetirizine (ZYRTEC ALLERGY) 10 MG tablet Take 1 tablet (10 mg total) by mouth daily for 10 days. 30 tablet 2  ? hydrochlorothiazide (HYDRODIURIL) 25 MG tablet Take 1 tablet (25 mg total) by mouth daily. (Patient not taking: Reported on 01/14/2022) 30 tablet 2  ? HYDROcodone-acetaminophen (NORCO/VICODIN) 5-325 MG tablet Take 1 tablet by mouth every 4 (four) hours as needed for moderate pain. (Patient not taking: Reported on 01/14/2022) 20 tablet 0  ? meloxicam (MOBIC) 15 MG tablet Take 1 tablet (15 mg total) by mouth daily. (Patient not taking: Reported on 01/14/2022) 30 tablet 2  ? neomycin-polymyxin-pramoxine (NEOSPORIN PLUS) 1 % cream Apply topically 2 (two) times daily. (Patient not taking: Reported on 01/14/2022) 14.2 g 0  ? traMADol (ULTRAM) 50 MG tablet Take 1 tablet (50 mg total) by mouth every 6 (six) hours as needed. (Patient not taking: Reported on 01/14/2022) 15 tablet 0  ? ?No current facility-administered medications for this visit.  ? ? ?Review of Systems ?General: negative for fevers, changes in weight or night sweats ?Skin: negative for changes in moles or sores or rash ?Eyes: negative for changes in vision ?HEENT: negative for change in hearing, tinnitus, voice changes ?Pulmonary: negative for dyspnea, orthopnea, productive cough, wheezing ?Cardiac:  negative for palpitations, pain ?Gastrointestinal: negative for nausea, vomiting, constipation, diarrhea, hematemesis, hematochezia ?Genitourinary/Sexual: negative for dysuria, retention, hematuria, incontinence

## 2022-02-04 NOTE — Progress Notes (Signed)
Patient here for oncology follow-up appointment, expresses  concerns of fatigue  ?

## 2022-02-04 NOTE — Progress Notes (Signed)
Pre and post operative teaching completed. Provided printed copy of teaching in AVS. ?

## 2022-02-04 NOTE — H&P (View-Only) (Signed)
Gynecologic Oncology H and P ?  ?Referring Provider: ?Prentice Docker, MD ?  ?Chief Concern: right adnexal mass concerning for cancer ?  ?Subjective:  ?Valerie Andrews is a 56 y.o. [id number] who is seen in consultation from Dr. JGlennon Macfor RIGHT adnexal mixed density mass.  ?  ?Patient returns today to discuss MRI findings in view of her right ovarian mass.   ?  ?MRI pelvis 01/30/22 ?FINDINGS: ?Urinary Tract:  No abnormality visualized. ?Bowel:  Descending and sigmoid diverticulosis. ?Vascular/Lymphatic: No pathologically enlarged lymph nodes. No ?significant vascular abnormality seen. ?  ?Reproductive: Mixed solid cystic mass of the right ovary measuring ?7.1 x 6.9 x 4.9 cm, with heterogeneous associated contrast ?enhancement (series 3, image 15). The left ovary is normal in size ?and appearance. ?  ?Other:  None. ?  ?Musculoskeletal: No suspicious bone lesions identified. ?  ?IMPRESSION: ?1. Mixed solid cystic mass of the right ovary measuring 7.1 x 6.9 x ?4.9 cm, highly concerning for primary ovarian malignancy. ?2. Normal left ovary. ?3. No evidence of pelvic lymphadenopathy or metastatic disease. ?4. Descending and sigmoid diverticulosis. ?  ?Gyn Oncology history ?She presented to the ER on 12/06/2021 with Pt c/o RLQ abd pain that radiates to her R side that has been intermittent for the past couple of days but was worse.  ?  ?12/06/2021 CT abdomen and pelvis   ?  ?IMPRESSION: ?1. A 6.7 cm RIGHT adnexal mixed density mass is nonspecific. This may reflect a hemorrhagic cyst, ovarian cystic neoplasm or sequela of RIGHT ovarian torsion. Recommend further dedicated evaluation ?with pelvic ultrasound versus pelvic MRI and consideration of ?gynecologic consultation. ?2. Appendix is unremarkable. ?3. 2 mm left solid pulmonary nodule. No routine follow-up imaging is ?recommended per Fleischner Society Guidelines. ?These guidelines do not apply to immunocompromised patients and ?patients with cancer. Follow up in  patients with significant ?comorbidities as clinically warranted. For lung cancer screening, ?adhere to Lung-RADS guidelines. Reference: Radiology. 2017; ?284(1):228-43. ?  ?  ?12/06/2021 Pelvic [place]? Right ovary: Measurements: 5.5 x 4.5 x 4.1 cm = volume: 52 mL. There appears to ?be a predominantly solid but complex and multi septated mass within the right ovary. It appears to have blood flow. ?Left ovary Not visualized. ?IMPRESSION: Probable predominantly solid but complex and multi septated mass within the right ovary measuring at least 5 cm. Further evaluation with MRI with and without gadolinium is recommended to evaluate for possible ovarian neoplasm. ?  ?  ?Patient's ultrasound is most consistent with ovarian mass.  I spoke to Dr. JGlennon Macof OB/GYN who recommends obtaining a ROMA blood test to evaluate ovarian tumor markers.  He will follow-up in the office early this week for likely referral to gynecology oncology  ?  ?ROMA:  ?If the patient is postmenopausal, then the postmenopausal ROMA score  ?of less than 2.99 is consistent with a low likelihood of finding  ?a malignancy on surgery. ?  ?Cancer Antigen (CA) 125 0.0 - 38.1 U/mL 13.3   ?Comment: (NOTE)  ?Roche Diagnostics Electrochemiluminescence Immunoassay ([facility]  ?Values obtained with different assay methods or kits cannot be  ?used interchangeably.  Results cannot be interpreted as absolute  ?evidence of the presence or absence of malignant disease.   ?HE4 0.0 - 105.2 pmol/L 69.9   ?  ?Premenopausal Interp: HIGH Comment   ?Comment: (NOTE)  ?If the patient is premenopausal, then the premenopausal ROMA score  ?of greater than or equal to 1.14 is consistent with a high  ?likelihood of finding a  malignancy on surgery.   ?  ?Her pain has now completely resolved. The pain was described as intermittent over the course is ~24-36 hours. She would prefer to avoid surgery if possible. She is still having menstrual cycles and does not have c/o abnormal vaginal  bleeding.  ?  ?Problem List: ?    ?Patient Active Problem List  ?  Diagnosis Date Noted  ? Esophageal reflux 04/25/2015  ? Arthritis of both knees 04/25/2015  ?  ?OB History   ?  ?  Gravida  ?3  ? Para  ?1  ? Term  ?   ? Preterm  ?   ? AB  ?2  ? Living  ?   ?  ?  ?  SAB  ?2  ? IAB  ?   ? Ectopic  ?   ? Multiple  ?   ? Live Births  ?   ?    ?  ?   ?  ?  ?Past Medical History: ?    ?Past Medical History:  ?Diagnosis Date  ? Arthritis    ? BMI 45.0-49.9, adult ([place])    ? Chicken pox    ? GERD (gastroesophageal reflux disease)    ?  ?  ?Past Surgical History: ?     ?Past Surgical History:  ?Procedure Laterality Date  ? SALPINGECTOMY Left    ?  ectopic  ?  ?  ?Past Gynecologic History:  ?Menarche: 34 ?Menstrual details: Lasts 5 days ?Menses regular: yes ?Last Menstrual Period: Unknown ?History of Abnormal pap: no,  ?Last pap: NILM; HRHPV negative 01/07/2022 ?History of STDs: trichomonas 2017 ?Contraception: None ?  ?  ?OB History:  ?                ?OB History  ?Gravida Para Term Preterm AB Living  ?'3 1     2    '$ ?SAB IAB Ectopic Multiple Live Births     ?2             ?   ?# Outcome Date GA Lbr Len/2nd Weight Sex Delivery Anes PTL Lv  ?3 SAB                    ?2 SAB                    ?1 Para                    ?  ?  ?Family History: ?     ?Family History  ?Problem Relation Age of Onset  ? Hypertension Mother    ? Stroke Father    ? Diabetes Paternal Grandmother    ? Cancer Neg Hx    ? Breast cancer Neg Hx    ?  ?  ?Social History: ?Social History  ?  ?     ?Socioeconomic History  ? Marital status: Single  ?    Spouse name: Not on file  ? Number of children: Not on file  ? Years of education: Not on file  ? Highest education level: Not on file  ?Occupational History  ? Not on file  ?Tobacco Use  ? Smoking status: Never  ? Smokeless tobacco: Never  ?Substance and Sexual Activity  ? Alcohol use: No  ?    Alcohol/week: 0.0 standard drinks  ? Drug use: No  ? Sexual activity: Not Currently  ?    Birth control/protection: None   ?Other  Topics Concern  ? Not on file  ?Social History Narrative  ? Not on file  ?  ?Social Determinants of Health  ?  ?Financial Resource Strain: Not on file  ?Food Insecurity: Not on file  ?Transportation Needs: Not on file  ?Physical Activity: Not on file  ?Stress: Not on file  ?Social Connections: Not on file  ?Intimate Partner Violence: Not on file  ?  ?  ?Allergies: ?     ?Allergies  ?Allergen Reactions  ? Methocarbamol Other (See Comments)  ?    headache  ? Tramadol    ?    Other reaction(s): Other (See Comments) ?Increased pain and irritability   ?  ?  ?Current Medications: ?      ?Current Outpatient Medications  ?Medication Sig Dispense Refill  ? cetirizine (ZYRTEC) 10 MG tablet TAKE 1 TABLET (10 MG TOTAL) BY MOUTH DAILY FOR 10 DAYS. 30 tablet 2  ? ibuprofen (ADVIL) 200 MG tablet Take by mouth.      ? cetirizine (ZYRTEC ALLERGY) 10 MG tablet Take 1 tablet (10 mg total) by mouth daily for 10 days. 30 tablet 2  ? hydrochlorothiazide (HYDRODIURIL) 25 MG tablet Take 1 tablet (25 mg total) by mouth daily. (Patient not taking: Reported on 01/14/2022) 30 tablet 2  ? HYDROcodone-acetaminophen (NORCO/VICODIN) 5-325 MG tablet Take 1 tablet by mouth every 4 (four) hours as needed for moderate pain. (Patient not taking: Reported on 01/14/2022) 20 tablet 0  ? meloxicam (MOBIC) 15 MG tablet Take 1 tablet (15 mg total) by mouth daily. (Patient not taking: Reported on 01/14/2022) 30 tablet 2  ? neomycin-polymyxin-pramoxine (NEOSPORIN PLUS) 1 % cream Apply topically 2 (two) times daily. (Patient not taking: Reported on 01/14/2022) 14.2 g 0  ? traMADol (ULTRAM) 50 MG tablet Take 1 tablet (50 mg total) by mouth every 6 (six) hours as needed. (Patient not taking: Reported on 01/14/2022) 15 tablet 0  ?  ?No current facility-administered medications for this visit.  ?  ?  ?Review of Systems ?General: negative for fevers, changes in weight or night sweats ?Skin: negative for changes in moles or sores or rash ?Eyes: negative for changes in  vision ?HEENT: negative for change in hearing, tinnitus, voice changes ?Pulmonary: negative for dyspnea, orthopnea, productive cough, wheezing ?Cardiac: negative for palpitations, pain ?Gastrointestinal: negati

## 2022-02-04 NOTE — Patient Instructions (Signed)
You will receive a call for a pre-admission appointment. This will be a phone call. We will see you 4-6 weeks after surgery.      ?  ?DIVISION OF GYNECOLOGIC ONCOLOGY BOWEL PREP ?  ?The following instructions are extremely important to prepare for your surgery. ?Please follow them carefully ?  ?Step 1: Liquid Diet Instructions ? ?            Clear Liquid Diet for GYN Oncology Patients Day Before Surgery ?The day before your scheduled surgery DO NOT EAT any solid foods.  We do want you to drink enough liquids, but NO MILK products.  We do not want you to be dehydrated.  ?Clear liquids are defined as no milk products and no pieces of any solid food. Drink at least 64 oz. of fluid. ? ?The following are all approved for you to drink the day before you surgery. ?Chicken, Beef or Vegetable Broth (bouillon or consomm?) - NO BROTH AFTER MIDNIGHT ?Plain Jello  (no fruit) ?Water ?Strained lemonade or fruit punch ?Gatorade (any flavor) ?CLEAR Ensure or Boost Breeze ?Fruit juices without pulp, such as apple, grape, or cranberry juice ?Clear sodas - NO SODA AFTER MIDNIGHT ?Ice Pops without bits of fruit or fruit pulp ?Honey ?Tea or coffee without milk or cream ?                Any foods not on the above list should be avoided                      ?                                                                    ?   ?Step 2: Laxatives         ?  ?The evening before surgery:   Time: around 5pm ?  ?Follow these instructions carefully. ?  ?Administer 1 Dulcolax suppository according to manufacturer instructions on the box. You will need to purchase this laxative at a pharmacy or grocery store.  ?  ?Individual responses to laxatives vary; this prep may cause multiple bowel movements. It often works in 30 minutes and may take as long as 3 hours. Stay near an available bathroom.  ?  ?It is important to stay hydrated. Ensure you are still drinking clear liquids. ?  ?  ?IMPORTANT: FOR YOUR SAFETY, WE WILL HAVE TO CANCEL YOUR  SURGERY IF YOU DO NOT FOLLOW THESE INSTRUCTIONS. ?  ?Do not eat anything after midnight (including gum or candy) prior to your surgery. ?Avoid drinking carbonated beverages after midnight. ?You can have clear liquids up until one hour before you arrive at the hospital. ?Nothing by mouth? means no liquids, gum, candy, etc for one hour before your arrival time. ?  ?  ? Laparoscopy ?Laparoscopy is a procedure to diagnose diseases in the abdomen. During the procedure, a thin, lighted, pencil-sized instrument called a laparoscope is inserted into the abdomen through an incision. The laparoscope allows your health care provider to look at the organs inside your body. ?LET [facility] CARE PROVIDER KNOW ABOUT: ?Any allergies you have. ?All medicines you are taking, including vitamins, herbs, eye drops, creams, and over-the-counter medicines. ?Previous problems you or members  of your family have had with the use of anesthetics. ?Any blood disorders you have. ?Previous surgeries you have had. ?Medical conditions you have. ?RISKS AND COMPLICATIONS  ?Generally, this is a safe procedure. However, problems can occur, which may include: ?Infection. ?Bleeding. ?Damage to other organs. ?Allergic reaction to the anesthetics used during the procedure. ?BEFORE THE PROCEDURE ?Do not eat or drink anything after midnight on the night before the procedure or as directed by your health care provider. ?Ask your health care provider about: ?Changing or stopping your regular medicines. ?Taking medicines such as aspirin and ibuprofen. These medicines can thin your blood. Do not take these medicines before your procedure if your health care provider instructs you not to. ?Plan to have someone take you home after the procedure. ?PROCEDURE ?You may be given a medicine to help you relax (sedative). ?You will be given a medicine to make you sleep (general anesthetic). ?Your abdomen will be inflated with a gas. This will make your organs easier to  see. ?Small incisions will be made in your abdomen. ?A laparoscope and other small instruments will be inserted into the abdomen through the incisions. ?A tissue sample may be removed from an organ in the abdomen for examination. ?The instruments will be removed from the abdomen. ?The gas will be released. ?The incisions will be closed with stitches (sutures). ?AFTER THE PROCEDURE  ?Your blood pressure, heart rate, breathing rate, and blood oxygen level will be monitored often until the medicines you were given have worn off. ?  ?This information is not intended to replace advice given to you by your health care provider. Make sure you discuss any questions you have with your health care provider. ? ?              ?  ?  ?                         ?Bowel Symptoms After Surgery ?After gynecologic surgery, women often have temporary changes in bowel function (constipation and gas pain).  Following are tips to help prevent and treat common bowel problems.  It also tells you when to call the doctor.  This is important because some symptoms might be a sign of a more serious bowel problem such as obstruction (bowel blockage).  These problems are rare but can happen after gynecologic surgery. ?  ?Besides surgery, what can temporarily affect bowel function? ?1. Dietary changes   2. Decreased physical activity   3.Antibiotics   4. Pain medication ?  ?How can I prevent constipation (three days or more without a stool)? ?Include fiber in your diet: whole grains, raw or dried fruits & vegetables, prunes, prune/pear juiceDrink at least 8 glasses of liquid (preferably water) every day ?Avoid: ?Gas forming foods such as broccoli, beans, peas, salads, cabbage, sweet potatoes ?Greasy, fatty, or fried foods ?Activity helps bowel function return to normal, walk around the house at least 3-4 times each day for 15 minutes or longer, if tolerated.  Rocking in a rocking chair is preferable to sitting still. ?Stool softeners: these are not  laxatives, but serve to soften the stool to avoid straining.  Take 2-4 times a day until normal bowel function returns  ?       Examples: Colace or generic equivalent (Docusate) ?Bulk laxatives: provide a concentrated source of fiber.  They do not stimulate the bowel.  Take 1-2 times each day until normal bowel function return. ?  Examples: Citrucel, Metamucil, Fiberal, Fibercon ?  ?What can I take for ?Gas Pains?? ?Simethicone (Mylicon, Gas-X, Maalox-Gas, Mylanta-Gas) take 3-4 times a day ?Maalox Regular - take 3-4 times a day ?Mylanta Regular - take 3-4 times a day ?  ?What can I take if I become constipated? ?Start with stool softeners and add additional laxatives below as needed to have a bowel movement every 1-2 days  ?Stool softeners 1-2 tablets, 2 times a day ?Senokot 1-2 tablets, 1-2 times a day ?Glycerin suppository can soften hard stool take once a day ?Bisacodyl suppository once a day  ?Milk of Magnesia 30 mL 1-2 times a day ?Fleets or tap water enema  ?  ?What can I do for nausea?  ?Limit most solid foods for 24-48 hours ?Continue eating small frequent amounts of liquids and/or bland soft foods ?Toast, crackers, cooked cereal (grits, cream of wheat, rice) ?Benadryl: a mild anti-nausea medicine can be obtained without a prescription. May cause drowsiness, especially if taken with narcotic pain medicines ?Contact provider for prescription nausea medication   ?  ?What can I do, or take for diarrhea (more than five loose stools per day)? ?Drink plenty of clear fluids to prevent dehydration ?May take Kaopectate, Pepto-Bismol, Imodium, or probiotics for 1-2 days ?Anusol or Preparation-H can be helpful for hemorrhoids and irritated tissue around anus ?  ?When should I call the doctor? ?            CONSTIPATION:  ?Not relieved after three days following the above program ?VOMITING: ?That contains blood, ?coffee ground? material ?More the three times/hour and unable to keep down nausea medication for  more than eight hours ?With dry mouth, dark or strong urine, feeling light-headed, dizzy, or confused ?With severe abdominal pain or bloating for more than 24 hours ?DIARRHEA: ?That continues for more then 2

## 2022-02-04 NOTE — H&P (Signed)
Gynecologic Oncology H and P ?  ?Referring Provider: ?Prentice Docker, MD ?  ?Chief Concern: right adnexal mass concerning for cancer ?  ?Subjective:  ?Valerie Andrews is a 56 y.o. [id number] who is seen in consultation from Dr. JGlennon Macfor RIGHT adnexal mixed density mass.  ?  ?Patient returns today to discuss MRI findings in view of her right ovarian mass.   ?  ?MRI pelvis 01/30/22 ?FINDINGS: ?Urinary Tract:  No abnormality visualized. ?Bowel:  Descending and sigmoid diverticulosis. ?Vascular/Lymphatic: No pathologically enlarged lymph nodes. No ?significant vascular abnormality seen. ?  ?Reproductive: Mixed solid cystic mass of the right ovary measuring ?7.1 x 6.9 x 4.9 cm, with heterogeneous associated contrast ?enhancement (series 3, image 15). The left ovary is normal in size ?and appearance. ?  ?Other:  None. ?  ?Musculoskeletal: No suspicious bone lesions identified. ?  ?IMPRESSION: ?1. Mixed solid cystic mass of the right ovary measuring 7.1 x 6.9 x ?4.9 cm, highly concerning for primary ovarian malignancy. ?2. Normal left ovary. ?3. No evidence of pelvic lymphadenopathy or metastatic disease. ?4. Descending and sigmoid diverticulosis. ?  ?Gyn Oncology history ?She presented to the ER on 12/06/2021 with Pt c/o RLQ abd pain that radiates to her R side that has been intermittent for the past couple of days but was worse.  ?  ?12/06/2021 CT abdomen and pelvis   ?  ?IMPRESSION: ?1. A 6.7 cm RIGHT adnexal mixed density mass is nonspecific. This may reflect a hemorrhagic cyst, ovarian cystic neoplasm or sequela of RIGHT ovarian torsion. Recommend further dedicated evaluation ?with pelvic ultrasound versus pelvic MRI and consideration of ?gynecologic consultation. ?2. Appendix is unremarkable. ?3. 2 mm left solid pulmonary nodule. No routine follow-up imaging is ?recommended per Fleischner Society Guidelines. ?These guidelines do not apply to immunocompromised patients and ?patients with cancer. Follow up in  patients with significant ?comorbidities as clinically warranted. For lung cancer screening, ?adhere to Lung-RADS guidelines. Reference: Radiology. 2017; ?284(1):228-43. ?  ?  ?12/06/2021 Pelvic [place]? Right ovary: Measurements: 5.5 x 4.5 x 4.1 cm = volume: 52 mL. There appears to ?be a predominantly solid but complex and multi septated mass within the right ovary. It appears to have blood flow. ?Left ovary Not visualized. ?IMPRESSION: Probable predominantly solid but complex and multi septated mass within the right ovary measuring at least 5 cm. Further evaluation with MRI with and without gadolinium is recommended to evaluate for possible ovarian neoplasm. ?  ?  ?Patient's ultrasound is most consistent with ovarian mass.  I spoke to Dr. JGlennon Macof OB/GYN who recommends obtaining a ROMA blood test to evaluate ovarian tumor markers.  He will follow-up in the office early this week for likely referral to gynecology oncology  ?  ?ROMA:  ?If the patient is postmenopausal, then the postmenopausal ROMA score  ?of less than 2.99 is consistent with a low likelihood of finding  ?a malignancy on surgery. ?  ?Cancer Antigen (CA) 125 0.0 - 38.1 U/mL 13.3   ?Comment: (NOTE)  ?Roche Diagnostics Electrochemiluminescence Immunoassay ([facility]  ?Values obtained with different assay methods or kits cannot be  ?used interchangeably.  Results cannot be interpreted as absolute  ?evidence of the presence or absence of malignant disease.   ?HE4 0.0 - 105.2 pmol/L 69.9   ?  ?Premenopausal Interp: HIGH Comment   ?Comment: (NOTE)  ?If the patient is premenopausal, then the premenopausal ROMA score  ?of greater than or equal to 1.14 is consistent with a high  ?likelihood of finding a  malignancy on surgery.   ?  ?Her pain has now completely resolved. The pain was described as intermittent over the course is ~24-36 hours. She would prefer to avoid surgery if possible. She is still having menstrual cycles and does not have c/o abnormal vaginal  bleeding.  ?  ?Problem List: ?    ?Patient Active Problem List  ?  Diagnosis Date Noted  ? Esophageal reflux 04/25/2015  ? Arthritis of both knees 04/25/2015  ?  ?OB History   ?  ?  Gravida  ?3  ? Para  ?1  ? Term  ?   ? Preterm  ?   ? AB  ?2  ? Living  ?   ?  ?  ?  SAB  ?2  ? IAB  ?   ? Ectopic  ?   ? Multiple  ?   ? Live Births  ?   ?    ?  ?   ?  ?  ?Past Medical History: ?    ?Past Medical History:  ?Diagnosis Date  ? Arthritis    ? BMI 45.0-49.9, adult ([place])    ? Chicken pox    ? GERD (gastroesophageal reflux disease)    ?  ?  ?Past Surgical History: ?     ?Past Surgical History:  ?Procedure Laterality Date  ? SALPINGECTOMY Left    ?  ectopic  ?  ?  ?Past Gynecologic History:  ?Menarche: 38 ?Menstrual details: Lasts 5 days ?Menses regular: yes ?Last Menstrual Period: Unknown ?History of Abnormal pap: no,  ?Last pap: NILM; HRHPV negative 01/07/2022 ?History of STDs: trichomonas 2017 ?Contraception: None ?  ?  ?OB History:  ?                ?OB History  ?Gravida Para Term Preterm AB Living  ?'3 1     2    '$ ?SAB IAB Ectopic Multiple Live Births     ?2             ?   ?# Outcome Date GA Lbr Len/2nd Weight Sex Delivery Anes PTL Lv  ?3 SAB                    ?2 SAB                    ?1 Para                    ?  ?  ?Family History: ?     ?Family History  ?Problem Relation Age of Onset  ? Hypertension Mother    ? Stroke Father    ? Diabetes Paternal Grandmother    ? Cancer Neg Hx    ? Breast cancer Neg Hx    ?  ?  ?Social History: ?Social History  ?  ?     ?Socioeconomic History  ? Marital status: Single  ?    Spouse name: Not on file  ? Number of children: Not on file  ? Years of education: Not on file  ? Highest education level: Not on file  ?Occupational History  ? Not on file  ?Tobacco Use  ? Smoking status: Never  ? Smokeless tobacco: Never  ?Substance and Sexual Activity  ? Alcohol use: No  ?    Alcohol/week: 0.0 standard drinks  ? Drug use: No  ? Sexual activity: Not Currently  ?    Birth control/protection: None   ?Other  Topics Concern  ? Not on file  ?Social History Narrative  ? Not on file  ?  ?Social Determinants of Health  ?  ?Financial Resource Strain: Not on file  ?Food Insecurity: Not on file  ?Transportation Needs: Not on file  ?Physical Activity: Not on file  ?Stress: Not on file  ?Social Connections: Not on file  ?Intimate Partner Violence: Not on file  ?  ?  ?Allergies: ?     ?Allergies  ?Allergen Reactions  ? Methocarbamol Other (See Comments)  ?    headache  ? Tramadol    ?    Other reaction(s): Other (See Comments) ?Increased pain and irritability   ?  ?  ?Current Medications: ?      ?Current Outpatient Medications  ?Medication Sig Dispense Refill  ? cetirizine (ZYRTEC) 10 MG tablet TAKE 1 TABLET (10 MG TOTAL) BY MOUTH DAILY FOR 10 DAYS. 30 tablet 2  ? ibuprofen (ADVIL) 200 MG tablet Take by mouth.      ? cetirizine (ZYRTEC ALLERGY) 10 MG tablet Take 1 tablet (10 mg total) by mouth daily for 10 days. 30 tablet 2  ? hydrochlorothiazide (HYDRODIURIL) 25 MG tablet Take 1 tablet (25 mg total) by mouth daily. (Patient not taking: Reported on 01/14/2022) 30 tablet 2  ? HYDROcodone-acetaminophen (NORCO/VICODIN) 5-325 MG tablet Take 1 tablet by mouth every 4 (four) hours as needed for moderate pain. (Patient not taking: Reported on 01/14/2022) 20 tablet 0  ? meloxicam (MOBIC) 15 MG tablet Take 1 tablet (15 mg total) by mouth daily. (Patient not taking: Reported on 01/14/2022) 30 tablet 2  ? neomycin-polymyxin-pramoxine (NEOSPORIN PLUS) 1 % cream Apply topically 2 (two) times daily. (Patient not taking: Reported on 01/14/2022) 14.2 g 0  ? traMADol (ULTRAM) 50 MG tablet Take 1 tablet (50 mg total) by mouth every 6 (six) hours as needed. (Patient not taking: Reported on 01/14/2022) 15 tablet 0  ?  ?No current facility-administered medications for this visit.  ?  ?  ?Review of Systems ?General: negative for fevers, changes in weight or night sweats ?Skin: negative for changes in moles or sores or rash ?Eyes: negative for changes in  vision ?HEENT: negative for change in hearing, tinnitus, voice changes ?Pulmonary: negative for dyspnea, orthopnea, productive cough, wheezing ?Cardiac: negative for palpitations, pain ?Gastrointestinal: negati

## 2022-02-10 ENCOUNTER — Encounter
Admission: RE | Admit: 2022-02-10 | Discharge: 2022-02-10 | Disposition: A | Discharge status: Home / Self Care | Payer: 59 | Source: Ambulatory Visit | Attending: Obstetrics and Gynecology | Admitting: Obstetrics and Gynecology

## 2022-02-10 ENCOUNTER — Other Ambulatory Visit: Payer: Self-pay

## 2022-02-10 NOTE — Patient Instructions (Signed)
Your procedure is scheduled on: 02/18/22 Report to [facility]. To find out your arrival time please call [phone] between 1PM - 3PM on 02/17/22.  Remember: Instructions that are not followed completely may result in serious medical risk, up to and including death, or upon the discretion of your surgeon and anesthesiologist your surgery may need to be rescheduled.     _X__ 1. Do not eat food after midnight the night before your procedure.                 No gum chewing or hard candies. You may drink clear liquids up to 2 hours                 before you are scheduled to arrive for your surgery- DO not drink clear                 liquids within 2 hours of the start of your surgery.                 Clear Liquids include:  water, apple juice without pulp, clear carbohydrate                 drink such as Clearfast or Gatorade, Black Coffee or Tea (Do not add                 anything to coffee or tea). Diabetics water only  __X__2.  On the morning of surgery brush your teeth with toothpaste and water, you                 may rinse your mouth with mouthwash if you wish.  Do not swallow any              toothpaste of mouthwash.     _X__ 3.  No Alcohol for 24 hours before or after surgery.   _X__ 4.  Do Not Smoke or use e-cigarettes For 24 Hours Prior to Your Surgery.                 Do not use any chewable tobacco products for at least 6 hours prior to                 surgery.  ____  5.  Bring all medications with you on the day of surgery if instructed.   __X__  6.  Notify your doctor if there is any change in your medical condition      (cold, fever, infections).     Do not wear jewelry, make-up, hairpins, clips or nail polish. Do not wear lotions, powders, or perfumes. May wear deodorant if necessary Do not shave body hair 48 hours prior to surgery. Men may shave face and neck. Do not bring valuables to the hospital.    [facility]  is not responsible for any belongings or valuables.  Contacts, dentures/partials or body piercings may not be worn into surgery. Bring a case for your contacts, glasses or hearing aids, a denture cup will be supplied. Leave your suitcase in the car. After surgery it may be brought to your room. For patients admitted to the hospital, discharge time is determined by your treatment team.   Patients discharged the day of surgery will not be allowed to drive home.   Please read over the following fact sheets that you were given:   CHG soap  __X__ Take these medicines the morning of surgery with  A SIP OF WATER:    1. cetirizine (ZYRTEC ALLERGY) 10 MG tablet if needed  2.   3.   4.  5.  6.  ____ Fleet Enema (as directed)   __X__ Use CHG Soap/SAGE wipes as directed  ____ Use inhalers on the day of surgery  ____ Stop metformin/Janumet/Farxiga 2 days prior to surgery    ____ Take 1/2 of usual insulin dose the night before surgery. No insulin the morning          of surgery.   ____ Stop Blood Thinners Coumadin/Plavix/Xarelto/Pleta/Pradaxa/Eliquis/Effient/Aspirin  on   Or contact your Surgeon, Cardiologist or Medical Doctor regarding  ability to stop your blood thinners  __X__ Stop Anti-inflammatories 7 days before surgery such as Advil, Ibuprofen, Motrin,  BC or Goodies Powder, Naprosyn, Naproxen, Aleve, Aspirin   May take Tylenol if needed  __X__ Stop all herbals and supplements, fish oil or vitamins  for 7 days until after surgery.    ____ Bring C-Pap to the hospital.

## 2022-02-12 ENCOUNTER — Other Ambulatory Visit
Admission: RE | Admit: 2022-02-12 | Discharge: 2022-02-12 | Disposition: A | Discharge status: Home / Self Care | Payer: 59 | Source: Ambulatory Visit | Attending: Obstetrics and Gynecology | Admitting: Obstetrics and Gynecology

## 2022-02-12 DIAGNOSIS — N9489 Other specified conditions associated with female genital organs and menstrual cycle: Secondary | ICD-10-CM | POA: Diagnosis not present

## 2022-02-12 LAB — COMPREHENSIVE METABOLIC PANEL
ALT: 12 U/L (ref 0–44)
AST: 15 U/L (ref 15–41)
Albumin: 3.4 g/dL — ABNORMAL LOW (ref 3.5–5.0)
Alkaline Phosphatase: 73 U/L (ref 38–126)
Anion gap: 10 (ref 5–15)
BUN: 19 mg/dL (ref 6–20)
CO2: 25 mmol/L (ref 22–32)
Calcium: 8.7 mg/dL — ABNORMAL LOW (ref 8.9–10.3)
Chloride: 104 mmol/L (ref 98–111)
Creatinine, Ser: 1.08 mg/dL — ABNORMAL HIGH (ref 0.44–1.00)
GFR, Estimated: >60 mL/min (ref >60)
Glucose, Bld: 98 mg/dL (ref 70–99)
Potassium: 3.9 mmol/L (ref 3.5–5.1)
Sodium: 139 mmol/L (ref 135–145)
Total Bilirubin: 1 mg/dL (ref 0.3–1.2)
Total Protein: 6.8 g/dL (ref 6.5–8.1)

## 2022-02-12 LAB — CBC WITH DIFFERENTIAL/PLATELET
Abs Immature Granulocytes: 0.01 10*3/uL (ref 0.00–0.07)
Basophils Absolute: 0 10*3/uL (ref 0.0–0.1)
Basophils Relative: 0 %
Eosinophils Absolute: 0.3 10*3/uL (ref 0.0–0.5)
Eosinophils Relative: 5 %
HCT: 41.7 % (ref 36.0–46.0)
Hemoglobin: 13.6 g/dL (ref 12.0–15.0)
Immature Granulocytes: 0 %
Lymphocytes Relative: 50 %
Lymphs Abs: 2.4 10*3/uL (ref 0.7–4.0)
MCH: 22.9 pg — ABNORMAL LOW (ref 26.0–34.0)
MCHC: 32.6 g/dL (ref 30.0–36.0)
MCV: 70.1 fL — ABNORMAL LOW (ref 80.0–100.0)
Monocytes Absolute: 0.4 10*3/uL (ref 0.1–1.0)
Monocytes Relative: 9 %
Neutro Abs: 1.7 10*3/uL (ref 1.7–7.7)
Neutrophils Relative %: 36 %
Platelets: 173 10*3/uL (ref 150–400)
RBC: 5.95 MIL/uL — ABNORMAL HIGH (ref 3.87–5.11)
RDW: 15.1 % (ref 11.5–15.5)
WBC: 4.9 10*3/uL (ref 4.0–10.5)
nRBC: 0 % (ref 0.0–0.2)

## 2022-02-12 LAB — TYPE AND SCREEN
ABO/RH(D): B POS
Antibody Screen: NEGATIVE

## 2022-02-17 MED ORDER — HEPARIN SODIUM (PORCINE) 5000 UNIT/ML IJ SOLN: 5000.0000 [IU] | Freq: Once | INTRAMUSCULAR | Status: AC

## 2022-02-17 MED ORDER — METRONIDAZOLE 500 MG/100ML IV SOLN
500.0000 mg | INTRAVENOUS | Status: AC
  Filled 2022-02-17: qty 100

## 2022-02-17 MED ORDER — FAMOTIDINE 20 MG PO TABS
20.0000 mg | ORAL_TABLET | Freq: Once | ORAL | Status: AC
  Administered 2022-03-04: 20 mg via ORAL

## 2022-02-17 MED ORDER — CHLORHEXIDINE GLUCONATE 0.12 % MT SOLN
15.0000 mL | Freq: Once | OROMUCOSAL | Status: AC
  Administered 2022-03-04: 15 mL via OROMUCOSAL

## 2022-02-17 MED ORDER — ORAL CARE MOUTH RINSE: 15.0000 mL | Freq: Once | OROMUCOSAL | Status: AC

## 2022-02-17 MED ORDER — CEFAZOLIN IN SODIUM CHLORIDE 3-0.9 GM/100ML-% IV SOLN
3.0000 g | INTRAVENOUS | Status: AC
  Filled 2022-02-17: qty 100

## 2022-02-17 MED ORDER — LACTATED RINGERS IV SOLN: INTRAVENOUS | Status: DC

## 2022-02-18 DIAGNOSIS — N9489 Other specified conditions associated with female genital organs and menstrual cycle: Secondary | ICD-10-CM

## 2022-02-18 MED ORDER — MIDAZOLAM HCL 2 MG/2ML IJ SOLN
INTRAMUSCULAR | Status: AC
  Filled 2022-02-18: qty 2

## 2022-02-18 MED ORDER — FENTANYL CITRATE (PF) 100 MCG/2ML IJ SOLN
INTRAMUSCULAR | Status: AC
  Filled 2022-02-18: qty 2

## 2022-02-18 NOTE — Anesthesia Preprocedure Evaluation (Addendum)
Anesthesia Evaluation  ?Patient identified by MRN, date of birth, ID band ?Patient awake ? ? ? ?Reviewed: ?Allergy & Precautions, H&P , NPO status , Patient's Chart, lab work & pertinent test results, reviewed documented beta blocker date and time  ? ?Airway ?Mallampati: III ? ?TM Distance: >3 FB ?Neck ROM: full ? ? ? Dental ? ?(+) Chipped ?  ?Pulmonary ?neg pulmonary ROS,  ?  ?Pulmonary exam normal ? ? ? ? ? ? ? Cardiovascular ?Exercise Tolerance: Good ?negative cardio ROS ?Normal cardiovascular exam ?Rhythm:regular Rate:Normal ? ? ?  ?Neuro/Psych ?negative neurological ROS ? negative psych ROS  ? GI/Hepatic ?negative GI ROS, Neg liver ROS, GERD  Medicated,  ?Endo/Other  ?Morbid obesity ? Renal/GU ?negative Renal ROS  ?negative genitourinary ?  ?Musculoskeletal ? ? Abdominal ?  ?Peds ? Hematology ?negative hematology ROS ?(+)   ?Anesthesia Other Findings ?Past Medical History: ?No date: Arthritis ?No date: BMI 45.0-49.9, adult ([place]) ?No date: Chicken pox ?No date: GERD (gastroesophageal reflux disease) ? ?Past Surgical History: ?No date: SALPINGECTOMY; Left ?    Comment:  ectopic ? ? ? ? Reproductive/Obstetrics ?negative OB ROS ? ?  ? ? ? ? ? ? ? ? ? ? ? ? ? ?  ?  ? ? ? ? ? ? ? ?Anesthesia Physical ?Anesthesia Plan ? ?ASA: 3 ? ?Anesthesia Plan: General ETT and General  ? ?Post-op Pain Management: Toradol IV (intra-op)* and Ofirmev IV (intra-op)*  ? ?Induction: Intravenous ? ?PONV Risk Score and Plan: Ondansetron, Dexamethasone, Midazolam and Treatment may vary due to age or medical condition ? ?Airway Management Planned:  ? ?Additional Equipment:  ? ?Intra-op Plan:  ? ?Post-operative Plan:  ? ?Informed Consent: I have reviewed the patients History and Physical, chart, labs and discussed the procedure including the risks, benefits and alternatives for the proposed anesthesia with the patient or authorized representative who has indicated his/her understanding and acceptance.   ? ? ? ?Dental Advisory Given ? ?Plan Discussed with: Anesthesiologist, CRNA and Surgeon ? ?Anesthesia Plan Comments:   ? ? ? ? ? ?Anesthesia Quick Evaluation ? ?

## 2022-02-19 ENCOUNTER — Telehealth: Payer: Self-pay | Admitting: *Deleted

## 2022-02-19 MED ORDER — PHENYLEPHRINE HCL (PRESSORS) 10 MG/ML IV SOLN
INTRAVENOUS | Status: AC
Start: 2022-02-19 — End: ?
  Filled 2022-02-19: qty 1

## 2022-02-19 NOTE — Telephone Encounter (Signed)
Message from answering service asking when she is to have surgery. Please return her call ?

## 2022-02-23 NOTE — Telephone Encounter (Signed)
Surgery was rescheduled to 4/26. I have called and left a voicemail with Ms. Fretz to return my call for notification.

## 2022-02-24 ENCOUNTER — Telehealth: Payer: Self-pay

## 2022-02-24 NOTE — Telephone Encounter (Signed)
Spoke to Valerie Andrews and provided new surgery date of 03/04/22. Prep/instructions for surgery reviewed and she has a printed copy of these as well. Instructions read back by Valerie Andrews. Provided number to PAT to obtain soap and information she was previously given to find out how to obtain her arrival time. She was also provided my number to call with any questions. ?

## 2022-03-03 MED ORDER — CHLORHEXIDINE GLUCONATE 4 % EX LIQD: 60.0000 mL | Freq: Once | CUTANEOUS | Status: DC

## 2022-03-03 MED ORDER — CHLORHEXIDINE GLUCONATE 4 % EX LIQD
60.0000 mL | Freq: Once | CUTANEOUS | Status: AC
  Administered 2022-03-04: 4 via TOPICAL

## 2022-03-04 ENCOUNTER — Other Ambulatory Visit: Payer: Self-pay

## 2022-03-04 ENCOUNTER — Ambulatory Visit
Admission: RE | Admit: 2022-03-04 | Discharge: 2022-03-04 | Disposition: A | Discharge status: Home / Self Care | Payer: 59 | Source: Ambulatory Visit | Attending: Obstetrics and Gynecology | Admitting: Obstetrics and Gynecology

## 2022-03-04 ENCOUNTER — Ambulatory Visit: Payer: 59 | Admitting: Anesthesiology

## 2022-03-04 ENCOUNTER — Encounter: Payer: Self-pay | Admitting: Obstetrics and Gynecology

## 2022-03-04 ENCOUNTER — Encounter
Admission: RE | Disposition: A | Discharge status: Home / Self Care | Payer: Self-pay | Source: Ambulatory Visit | Attending: Obstetrics and Gynecology

## 2022-03-04 DIAGNOSIS — R1900 Intra-abdominal and pelvic swelling, mass and lump, unspecified site: Secondary | ICD-10-CM

## 2022-03-04 DIAGNOSIS — N9489 Other specified conditions associated with female genital organs and menstrual cycle: Secondary | ICD-10-CM

## 2022-03-04 DIAGNOSIS — C561 Malignant neoplasm of right ovary: Secondary | ICD-10-CM | POA: Insufficient documentation

## 2022-03-04 DIAGNOSIS — N736 Female pelvic peritoneal adhesions (postinfective): Secondary | ICD-10-CM | POA: Insufficient documentation

## 2022-03-04 DIAGNOSIS — K219 Gastro-esophageal reflux disease without esophagitis: Secondary | ICD-10-CM | POA: Diagnosis not present

## 2022-03-04 DIAGNOSIS — N838 Other noninflammatory disorders of ovary, fallopian tube and broad ligament: Secondary | ICD-10-CM | POA: Diagnosis not present

## 2022-03-04 DIAGNOSIS — N839 Noninflammatory disorder of ovary, fallopian tube and broad ligament, unspecified: Secondary | ICD-10-CM | POA: Diagnosis not present

## 2022-03-04 HISTORY — PX: ROBOTIC ASSISTED SALPINGO OOPHERECTOMY: SHX6082

## 2022-03-04 LAB — TYPE AND SCREEN
ABO/RH(D): B POS
Antibody Screen: NEGATIVE

## 2022-03-04 LAB — POCT PREGNANCY, URINE: Preg Test, Ur: NEGATIVE

## 2022-03-04 SURGERY — SALPINGO-OOPHORECTOMY, ROBOT-ASSISTED
Anesthesia: General | Laterality: Right

## 2022-03-04 MED ORDER — KETAMINE HCL 50 MG/5ML IJ SOSY
PREFILLED_SYRINGE | INTRAMUSCULAR | Status: AC
  Filled 2022-03-04: qty 5

## 2022-03-04 MED ORDER — OXYCODONE HCL 5 MG PO TABS: 5.0000 mg | ORAL_TABLET | Freq: Once | ORAL | Status: AC

## 2022-03-04 MED ORDER — 0.9 % SODIUM CHLORIDE (POUR BTL) OPTIME
TOPICAL | Status: DC | PRN
  Administered 2022-03-04: 1000 mL

## 2022-03-04 MED ORDER — PROPOFOL 10 MG/ML IV BOLUS
INTRAVENOUS | Status: AC
  Filled 2022-03-04: qty 20

## 2022-03-04 MED ORDER — ACETAMINOPHEN 10 MG/ML IV SOLN
INTRAVENOUS | Status: AC
  Filled 2022-03-04: qty 100

## 2022-03-04 MED ORDER — ONDANSETRON HCL 4 MG/2ML IJ SOLN
INTRAMUSCULAR | Status: DC | PRN
  Administered 2022-03-04: 4 mg via INTRAVENOUS

## 2022-03-04 MED ORDER — ONDANSETRON 4 MG PO TBDP: 4.0000 mg | ORAL_TABLET | Freq: Four times a day (QID) | ORAL | 0 refills | Status: DC | PRN

## 2022-03-04 MED ORDER — ONDANSETRON HCL 4 MG/2ML IJ SOLN
INTRAMUSCULAR | Status: AC
Start: 2022-03-04 — End: ?
  Filled 2022-03-04: qty 2

## 2022-03-04 MED ORDER — PROPOFOL 10 MG/ML IV BOLUS
INTRAVENOUS | Status: DC | PRN
  Administered 2022-03-04: 150 mg via INTRAVENOUS

## 2022-03-04 MED ORDER — DEXAMETHASONE SODIUM PHOSPHATE 10 MG/ML IJ SOLN
INTRAMUSCULAR | Status: DC | PRN
  Administered 2022-03-04: 10 mg via INTRAVENOUS

## 2022-03-04 MED ORDER — OXYCODONE HCL 5 MG PO TABS
5.0000 mg | ORAL_TABLET | Freq: Once | ORAL | Status: AC | PRN
  Administered 2022-03-04: 5 mg via ORAL

## 2022-03-04 MED ORDER — OXYCODONE HCL 5 MG PO TABS
ORAL_TABLET | ORAL | Status: AC
  Administered 2022-03-04: 5 mg via ORAL
  Filled 2022-03-04: qty 1

## 2022-03-04 MED ORDER — CEFAZOLIN IN SODIUM CHLORIDE 3-0.9 GM/100ML-% IV SOLN
3.0000 g | Freq: Once | INTRAVENOUS | Status: AC
  Administered 2022-03-04: 3 g via INTRAVENOUS
  Filled 2022-03-04: qty 100

## 2022-03-04 MED ORDER — PHENYLEPHRINE 80 MCG/ML (10ML) SYRINGE FOR IV PUSH (FOR BLOOD PRESSURE SUPPORT)
PREFILLED_SYRINGE | INTRAVENOUS | Status: AC
  Filled 2022-03-04: qty 10

## 2022-03-04 MED ORDER — MIDAZOLAM HCL 2 MG/2ML IJ SOLN
INTRAMUSCULAR | Status: AC
  Filled 2022-03-04: qty 2

## 2022-03-04 MED ORDER — SUGAMMADEX SODIUM 500 MG/5ML IV SOLN
INTRAVENOUS | Status: DC | PRN
  Administered 2022-03-04: 250 mg via INTRAVENOUS

## 2022-03-04 MED ORDER — FENTANYL CITRATE (PF) 100 MCG/2ML IJ SOLN
INTRAMUSCULAR | Status: DC | PRN
Start: 2022-03-04 — End: 2022-03-04
  Administered 2022-03-04 (×3): 50 ug via INTRAVENOUS

## 2022-03-04 MED ORDER — ONDANSETRON HCL 4 MG/2ML IJ SOLN
4.0000 mg | Freq: Once | INTRAMUSCULAR | Status: AC | PRN
  Administered 2022-03-04: 4 mg via INTRAVENOUS

## 2022-03-04 MED ORDER — FENTANYL CITRATE (PF) 100 MCG/2ML IJ SOLN
INTRAMUSCULAR | Status: AC
  Filled 2022-03-04: qty 2

## 2022-03-04 MED ORDER — HYDROCODONE-ACETAMINOPHEN 5-325 MG PO TABS: 1.0000 | ORAL_TABLET | ORAL | 0 refills | Status: DC | PRN

## 2022-03-04 MED ORDER — OXYCODONE HCL 5 MG PO TABS
ORAL_TABLET | ORAL | Status: AC
  Filled 2022-03-04: qty 1

## 2022-03-04 MED ORDER — SEVOFLURANE IN SOLN
RESPIRATORY_TRACT | Status: AC
  Filled 2022-03-04: qty 500

## 2022-03-04 MED ORDER — IBUPROFEN 600 MG PO TABS: 600.0000 mg | ORAL_TABLET | Freq: Four times a day (QID) | ORAL | 0 refills | Status: DC

## 2022-03-04 MED ORDER — MIDAZOLAM HCL 2 MG/2ML IJ SOLN
INTRAMUSCULAR | Status: DC | PRN
Start: 2022-03-04 — End: 2022-03-04
  Administered 2022-03-04: 2 mg via INTRAVENOUS

## 2022-03-04 MED ORDER — DEXAMETHASONE SODIUM PHOSPHATE 10 MG/ML IJ SOLN
INTRAMUSCULAR | Status: AC
  Filled 2022-03-04: qty 1

## 2022-03-04 MED ORDER — LIDOCAINE HCL (CARDIAC) PF 100 MG/5ML IV SOSY
PREFILLED_SYRINGE | INTRAVENOUS | Status: DC | PRN
  Administered 2022-03-04: 80 mg via INTRAVENOUS

## 2022-03-04 MED ORDER — ROCURONIUM BROMIDE 100 MG/10ML IV SOLN
INTRAVENOUS | Status: DC | PRN
Start: 2022-03-04 — End: 2022-03-04
  Administered 2022-03-04 (×2): 40 mg via INTRAVENOUS

## 2022-03-04 MED ORDER — FENTANYL CITRATE (PF) 100 MCG/2ML IJ SOLN
INTRAMUSCULAR | Status: AC
  Administered 2022-03-04: 25 ug via INTRAVENOUS
  Filled 2022-03-04: qty 2

## 2022-03-04 MED ORDER — BUPIVACAINE HCL (PF) 0.5 % IJ SOLN
INTRAMUSCULAR | Status: AC
  Filled 2022-03-04: qty 30

## 2022-03-04 MED ORDER — ONDANSETRON HCL 4 MG/2ML IJ SOLN
INTRAMUSCULAR | Status: AC
  Filled 2022-03-04: qty 2

## 2022-03-04 MED ORDER — LIDOCAINE HCL (PF) 2 % IJ SOLN
INTRAMUSCULAR | Status: AC
  Filled 2022-03-04: qty 5

## 2022-03-04 MED ORDER — KETAMINE HCL 10 MG/ML IJ SOLN
INTRAMUSCULAR | Status: DC | PRN
  Administered 2022-03-04: 30 mg via INTRAVENOUS
  Administered 2022-03-04: 10 mg via INTRAVENOUS

## 2022-03-04 MED ORDER — CHLORHEXIDINE GLUCONATE 0.12 % MT SOLN
OROMUCOSAL | Status: AC
  Filled 2022-03-04: qty 15

## 2022-03-04 MED ORDER — FENTANYL CITRATE (PF) 100 MCG/2ML IJ SOLN
25.0000 ug | INTRAMUSCULAR | Status: DC | PRN
  Administered 2022-03-04 (×3): 25 ug via INTRAVENOUS

## 2022-03-04 MED ORDER — PHENYLEPHRINE HCL (PRESSORS) 10 MG/ML IV SOLN
INTRAVENOUS | Status: DC | PRN
  Administered 2022-03-04: 160 ug via INTRAVENOUS

## 2022-03-04 MED ORDER — METRONIDAZOLE 500 MG/100ML IV SOLN
500.0000 mg | Freq: Once | INTRAVENOUS | Status: AC
  Administered 2022-03-04: 500 mg via INTRAVENOUS
  Filled 2022-03-04: qty 100

## 2022-03-04 MED ORDER — ACETAMINOPHEN 10 MG/ML IV SOLN
INTRAVENOUS | Status: DC | PRN
  Administered 2022-03-04: 1000 mg via INTRAVENOUS

## 2022-03-04 MED ORDER — BUPIVACAINE HCL (PF) 0.5 % IJ SOLN
INTRAMUSCULAR | Status: DC | PRN
  Administered 2022-03-04: 16 mL

## 2022-03-04 MED ORDER — OXYCODONE-ACETAMINOPHEN 5-325 MG PO TABS: 1.0000 | ORAL_TABLET | Freq: Four times a day (QID) | ORAL | 0 refills | Status: AC | PRN

## 2022-03-04 MED ORDER — HEPARIN SODIUM (PORCINE) 5000 UNIT/ML IJ SOLN
INTRAMUSCULAR | Status: AC
  Administered 2022-03-04: 5000 [IU] via SUBCUTANEOUS
  Filled 2022-03-04: qty 1

## 2022-03-04 MED ORDER — FAMOTIDINE 20 MG PO TABS
ORAL_TABLET | ORAL | Status: AC
  Filled 2022-03-04: qty 1

## 2022-03-04 MED ORDER — SUGAMMADEX SODIUM 500 MG/5ML IV SOLN
INTRAVENOUS | Status: AC
  Filled 2022-03-04: qty 5

## 2022-03-04 MED ORDER — ROCURONIUM BROMIDE 10 MG/ML (PF) SYRINGE
PREFILLED_SYRINGE | INTRAVENOUS | Status: AC
  Filled 2022-03-04: qty 10

## 2022-03-04 SURGICAL SUPPLY — 98 items
ADH SKN CLS APL DERMABOND .7 (GAUZE/BANDAGES/DRESSINGS) ×1
ANCHOR TIS RET SYS 1550ML (BAG) ×1 IMPLANT
ANCHOR TIS RET SYS 235ML (MISCELLANEOUS) ×1 IMPLANT
APL PRP STRL LF DISP 70% ISPRP (MISCELLANEOUS) ×1
BAG DRN RND TRDRP ANRFLXCHMBR (UROLOGICAL SUPPLIES) ×1
BAG LAPAROSCOPIC 12 15 PORT 16 (BASKET) IMPLANT
BAG RETRIEVAL 12/15 (BASKET)
BAG SPEC RTRVL C1550 25.4 (BAG) ×1
BAG TISS RTRVL C235 10X14 (MISCELLANEOUS) ×1
BAG URINE DRAIN 2000ML AR STRL (UROLOGICAL SUPPLIES) ×2 IMPLANT
BLADE SURG SZ11 CARB STEEL (BLADE) ×2 IMPLANT
CANNULA CAP OBTURATR AIRSEAL 8 (CAP) ×3 IMPLANT
CANNULA DILATOR 5 W/SLV (CANNULA) IMPLANT
CATH FOLEY 2WAY  5CC 16FR (CATHETERS) ×2
CATH FOLEY 2WAY 5CC 16FR (CATHETERS) ×1
CATH URTH 16FR FL 2W BLN LF (CATHETERS) ×1 IMPLANT
CHLORAPREP W/TINT 26 (MISCELLANEOUS) ×2 IMPLANT
CNTNR SPEC 2.5X3XGRAD LEK (MISCELLANEOUS) ×1
CONT SPEC 4OZ STER OR WHT (MISCELLANEOUS) ×1
CONT SPEC 4OZ STRL OR WHT (MISCELLANEOUS) ×1
CONTAINER SPEC 2.5X3XGRAD LEK (MISCELLANEOUS) IMPLANT
COUNTER NEEDLE 20/40 LG (NEEDLE) IMPLANT
COVER TIP SHEARS 8 DVNC (MISCELLANEOUS) ×1 IMPLANT
COVER TIP SHEARS 8MM DA VINCI (MISCELLANEOUS) ×2
COVER WAND RF STERILE (DRAPES) ×2 IMPLANT
DEFOGGER SCOPE WARMER CLEARIFY (MISCELLANEOUS) ×2 IMPLANT
DERMABOND ADVANCED (GAUZE/BANDAGES/DRESSINGS) ×1
DERMABOND ADVANCED .7 DNX12 (GAUZE/BANDAGES/DRESSINGS) ×1 IMPLANT
DRAPE 3/4 80X56 (DRAPES) ×2 IMPLANT
DRAPE ARM DVNC X/XI (DISPOSABLE) ×4 IMPLANT
DRAPE COLUMN DVNC XI (DISPOSABLE) ×1 IMPLANT
DRAPE DA VINCI XI ARM (DISPOSABLE) ×8
DRAPE DA VINCI XI COLUMN (DISPOSABLE) ×2
DRESSING SURGICEL FIBRLLR 1X2 (HEMOSTASIS) IMPLANT
DRSG SURGICEL FIBRILLAR 1X2 (HEMOSTASIS)
ELECT BLADE 6 FLAT ULTRCLN (ELECTRODE) IMPLANT
ELECT REM PT RETURN 9FT ADLT (ELECTROSURGICAL) ×2
ELECTRODE REM PT RTRN 9FT ADLT (ELECTROSURGICAL) ×1 IMPLANT
GAUZE 4X4 16PLY ~~LOC~~+RFID DBL (SPONGE) ×5 IMPLANT
GLOVE BIO SURGEON STRL SZ 6.5 (GLOVE) ×12 IMPLANT
GLOVE BIOGEL PI IND STRL 6.5 (GLOVE) ×4 IMPLANT
GLOVE BIOGEL PI INDICATOR 6.5 (GLOVE) ×4
GOWN STRL REUS W/ TWL LRG LVL3 (GOWN DISPOSABLE) ×8 IMPLANT
GOWN STRL REUS W/TWL LRG LVL3 (GOWN DISPOSABLE) ×16
IRRIGATION STRYKERFLOW (MISCELLANEOUS) IMPLANT
IRRIGATOR STRYKERFLOW (MISCELLANEOUS) ×2
IV NS 1000ML (IV SOLUTION)
IV NS 1000ML BAXH (IV SOLUTION) IMPLANT
KIT IMAGING PINPOINTPAQ (MISCELLANEOUS) IMPLANT
KIT PINK PAD W/HEAD ARE REST (MISCELLANEOUS) ×2
KIT PINK PAD W/HEAD ARM REST (MISCELLANEOUS) ×1 IMPLANT
LABEL OR SOLS (LABEL) ×2 IMPLANT
LIGASURE VESSEL 5MM BLUNT TIP (ELECTROSURGICAL) IMPLANT
MANIFOLD NEPTUNE II (INSTRUMENTS) ×2 IMPLANT
MANIPULATOR VCARE LG CRV RETR (MISCELLANEOUS) ×1 IMPLANT
MANIPULATOR VCARE SML CRV RETR (MISCELLANEOUS) IMPLANT
MANIPULATOR VCARE STD CRV RETR (MISCELLANEOUS) IMPLANT
NDL INSUFF ACCESS 14 VERSASTEP (NEEDLE) ×2 IMPLANT
NDL SPNL 22GX3.5 QUINCKE BK (NEEDLE) IMPLANT
NEEDLE HYPO 22GX1.5 SAFETY (NEEDLE) ×2 IMPLANT
NEEDLE SPNL 22GX3.5 QUINCKE BK (NEEDLE) IMPLANT
NEEDLE VERESS 14GA 120MM (NEEDLE) ×2 IMPLANT
NS IRRIG 1000ML POUR BTL (IV SOLUTION) ×2 IMPLANT
OBTURATOR OPTICAL STANDARD 8MM (TROCAR)
OBTURATOR OPTICAL STND 8 DVNC (TROCAR)
OBTURATOR OPTICALSTD 8 DVNC (TROCAR) IMPLANT
OCCLUDER COLPOPNEUMO (BALLOONS) ×2 IMPLANT
PACK GYN LAPAROSCOPIC (MISCELLANEOUS) ×2 IMPLANT
PAD OB MATERNITY 4.3X12.25 (PERSONAL CARE ITEMS) ×2 IMPLANT
PAD PREP 24X41 OB/GYN DISP (PERSONAL CARE ITEMS) ×2 IMPLANT
PENCIL ELECTRO HAND CTR (MISCELLANEOUS) IMPLANT
SEAL CANN UNIV 5-8 DVNC XI (MISCELLANEOUS) ×3 IMPLANT
SEAL XI 5MM-8MM UNIVERSAL (MISCELLANEOUS) ×6
SEALER VESSEL DA VINCI XI (MISCELLANEOUS)
SEALER VESSEL EXT DVNC XI (MISCELLANEOUS) IMPLANT
SET CYSTO W/LG BORE CLAMP LF (SET/KITS/TRAYS/PACK) IMPLANT
SET TUBE FILTERED XL AIRSEAL (SET/KITS/TRAYS/PACK) ×3 IMPLANT
SET TUBE SMOKE EVAC HIGH FLOW (TUBING) ×2 IMPLANT
SLEEVE ENDOPATH XCEL 5M (ENDOMECHANICALS) IMPLANT
SLEEVE VERSASTEP EXPAND ONEST (MISCELLANEOUS) IMPLANT
SOLUTION ELECTROLUBE (MISCELLANEOUS) ×2 IMPLANT
SUT DVC VLOC 180 0 12IN GS21 (SUTURE)
SUT MNCRL 4-0 (SUTURE) ×6
SUT MNCRL 4-0 27XMFL (SUTURE) ×3
SUT VIC AB 0 CT1 36 (SUTURE) ×2 IMPLANT
SUT VICRYL 0 UR6 27IN ABS (SUTURE) ×2 IMPLANT
SUTURE DVC VLC 180 0 12IN GS21 (SUTURE) IMPLANT
SUTURE MNCRL 4-0 27XMF (SUTURE) ×1 IMPLANT
SYR 10ML LL (SYRINGE) ×2 IMPLANT
SYR 3ML LL SCALE MARK (SYRINGE) IMPLANT
SYR 50ML LL SCALE MARK (SYRINGE) ×2 IMPLANT
SYR TOOMEY 50ML (SYRINGE) IMPLANT
TAPE TRANSPORE STRL 2 31045 (GAUZE/BANDAGES/DRESSINGS) ×2 IMPLANT
TRAP SPECIMEN MUCUS 40CC (MISCELLANEOUS) ×1 IMPLANT
TROCAR VERSASTEP PLUS 12MM (TROCAR) ×2 IMPLANT
TROCAR XCEL NON-BLD 11X100MML (ENDOMECHANICALS) IMPLANT
TROCAR XCEL NON-BLD 5MMX100MML (ENDOMECHANICALS) IMPLANT
WATER STERILE IRR 500ML POUR (IV SOLUTION) ×2 IMPLANT

## 2022-03-04 NOTE — Discharge Instructions (Signed)

## 2022-03-04 NOTE — Progress Notes (Signed)
?   03/04/22 0700  ?Clinical Encounter Type  ?Visited With Patient  ?Visit Type Initial;Pre-op  ?Spiritual Encounters  ?Spiritual Needs Prayer  ? ?Chaplain provided pre-op support ?

## 2022-03-04 NOTE — Transfer of Care (Signed)
Immediate Anesthesia Transfer of Care Note ? ?Patient: Valerie Andrews ? ?Procedure(s) Performed: XI ROBOTIC ASSISTED SALPINGO OOPHORECTOMY, LYSIS OF ADHESIONS (Right) ? ?Patient Location: PACU ? ?Anesthesia Type:General ? ?Level of Consciousness: drowsy and patient cooperative ? ?Airway & Oxygen Therapy: Patient Spontanous Breathing and Patient connected to face mask oxygen ? ?Post-op Assessment: Report given to RN and Post -op Vital signs reviewed and stable ? ?Post vital signs: Reviewed and stable ? ?Last Vitals:  ?Vitals Value Taken Time  ?BP 112/96 03/04/22 1024  ?Temp 36.1 ?C 03/04/22 1024  ?Pulse 91 03/04/22 1030  ?Resp 17 03/04/22 1030  ?SpO2 100 % 03/04/22 1030  ?Vitals shown include unvalidated device data. ? ?Last Pain:  ?Vitals:  ? 03/04/22 0705  ?TempSrc: Temporal  ?PainSc: 0-No pain  ?   ? ?Patients Stated Pain Goal: 0 (03/04/22 0705) ? ?Complications: No notable events documented. ?

## 2022-03-04 NOTE — Anesthesia Postprocedure Evaluation (Signed)
Anesthesia Post Note ? ?Patient: Valerie Andrews ? ?Procedure(s) Performed: XI ROBOTIC ASSISTED SALPINGO OOPHORECTOMY, LYSIS OF ADHESIONS (Right) ? ?Patient location during evaluation: PACU ?Anesthesia Type: General ?Level of consciousness: awake and alert, oriented and patient cooperative ?Pain management: pain level controlled ?Vital Signs Assessment: post-procedure vital signs reviewed and stable ?Respiratory status: spontaneous breathing, nonlabored ventilation and respiratory function stable ?Cardiovascular status: blood pressure returned to baseline and stable ?Postop Assessment: adequate PO intake ?Anesthetic complications: no ? ? ?No notable events documented. ? ? ?Last Vitals:  ?Vitals:  ? 03/04/22 1125 03/04/22 1130  ?BP: 110/63 136/80  ?Pulse: 79 87  ?Resp: 17 17  ?Temp: 36.7 ?C (!) 36.4 ?C  ?SpO2: 98% 99%  ?  ?Last Pain:  ?Vitals:  ? 03/04/22 1130  ?TempSrc: Temporal  ?PainSc: 9   ? ? ?  ?  ?  ?  ?  ?  ? ?Darrin Nipper ? ? ? ? ?

## 2022-03-04 NOTE — Interval H&P Note (Signed)
History and Physical Interval Note: ? ?03/04/2022 ?7:25 AM ? ?Valerie Andrews  has presented today for surgery, with the diagnosis of Right ovarian mass.  The various methods of treatment have been discussed with the patient and family. After consideration of risks, benefits and other options for treatment, the patient has consented to  Procedure(s): ?Possible XI ROBOTIC ASSISTED TOTAL HYSTERECTOMY, POSSIBLE PELVIC/AORTIC LYMPH NODE BIOPSIES, STAGING, POSSIBLE LAPAROTOMY, BOWEL RESECTION (Right) ?XI ROBOTIC ASSISTED SALPINGO OOPHORECTOMY (Right) as a surgical intervention.  The patient's history has been reviewed, patient examined, no change in status, stable for surgery.  I have reviewed the patient's chart and labs.  Questions were answered to the patient's satisfaction.   ? ?Discussed OR plan - RSO only if benign. If malignancy TH and staging. If borderline RSO only and reassess with final pathology.  ? ? ?[place] ? ? ?

## 2022-03-04 NOTE — Op Note (Addendum)
?Operative Note  ? ?Date 03/04/2022 ? ?Time 10:29 AM ? ?PRE-OP DIAGNOSIS: Right complex pelvic mass ? ?  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 ?SURGEON: Surgeon(s) and Role: ?Panel 1: ?    Valerie Hench Gaetana Michaelis, MD ? ?ASSISTANT:  Prentice Docker, MD ? ?ANESTHESIA: Choice  ? ?PROCEDURE: Procedure(s): Exam under anesthesia; Robotic-assisted right salpingo-oophorectomy; lysis of adhesions included enterolysis and ovariolysis, and washings. ? ?ESTIMATED BLOOD LOSS: Minimal ? ?DRAINS: Foley ? ?TOTAL IV FLUIDS: per anethesia ? ?SPECIMENS:  ?Right ovary and tube; washings ? ?COMPLICATIONS: None ? ?DISPOSITION: PACU ? ?CONDITION: Stable ? ?INDICATIONS: Right complex pelvic mass ? ?FINDINGS: Exam under anesthesia revealed a mobile anteverted uterus normal size. Palpable right adnexa; on the left there were no adnexal masses or nodularity. The parametria was smooth. The cervix was negative for gross lesions. Intraoperative findings included adhesions involving the omentum to the anterior pelvic wall, the appendix was adherent to the right IP ligament, the rectosigmoid was draped over the left adnexa, adherent to posterior low cervix and to the right ovary. The cyst did rupture intraoperative while trying to lyse the adhesions.   The upper abdomen was normal including omentum, bowel, liver, stomach, and diaphragmatic surfaces. There was no evidence of grossly malignant right pelvic or right IVC lymph nodes. The aorta was larger than expected at the bifurcation. Per CT scan no evidence of increased caliber or adenopathy.  ? ?PROCEDURE IN DETAIL: After informed consent was obtained, the patient was taken to the operating room where anesthesia was obtained without difficulty. The patient was positioned in the dorsal lithotomy position in [place] and her arms were carefully tucked at her sides and the usual precautions were  taken.  She was prepped and draped in normal sterile fashion.  Time-out was performed and a Foley catheter was placed into the bladder by Dr. Glennon Mac. Dr. Glennon Mac placed the standard [facility] uterine manipulator was then placed in the uterus without incident.   ? ?Operative entry was obtained via a supraumbilical incision and direct entry. The Hasson port was placed, abdomen insuffulated, and pelvis visualized with noted findings above.  The robotic ports and LUQ port placement. The patient was placed in Trendelenburg and the bowel was displaced up into the upper abdomen.  Cytologic washings were obtained.  The robot was docked. Adhesiolysis was performed resecting a portion of the anterior pelvic adhesions to improve visualization of the pelvis and releasing the appendiceal adhesions involving the right IP ligament. The right retroperitoneal space was opened and the ureter was identified and preserved.  The infundibulopelvic ligament was skeletonized, sealed and divided. Additional adhesiolysis was performed involving adhesions between the mass and the posterior uterus and sidewall. During this process the cyst was rupture and clear fluid removed. There was no tumor spillage. An additional adhesion between the mass and the epiploica of the sigmoid was excised. The mass was placed in a laparoscopic bag and brought the the surface at the umbilical port. The robotic was undocked. The mass was morcellated within the bag and sent to pathology. Pathology frozen section revealed granulosa tumor vs stromal tumor vs lymphoma. Further charaterization was needed to determine diagnosis. Given the broad differential diagnosis no further procedures were performed.    ? ?The umbilical trocar and camera were reinserted. The pelvis was copiously irrigated. The intraperitoneal pressure was dropped, and all planes of dissection, vascular pedicles and the vaginal cuff were found to be hemostatic. The  lateral and LUQ trocars were removed  under visualization.  Before the umbilical trocar was removed the CO2 gas was released.  The umbilical port fascia was closed with 0 Vicryl suture using a running technique.   The skin incision at the umbilicus was closed with subcuticular stitch.  The remaining skin incisions were closed with Indermil glue.  The patient tolerated the procedure well.  Sponge, lap and needle counts were correct x2.  The patient was taken to recovery room in excellent condition. ? ?Antibiotics: {Blank single:19197::"Given 1st or 2nd generation cephalosporin, and Flagyl. Antibiotics given within 1 hour of the start of the procedure, Antibiotics ordered to be discontinued within 24 hours post procedure" ? ?VTE prophylaxis: was ordered perioperatively. ? ?Dr. Glennon Mac assisted me with the procedure which could not have been performed without his assistance. This was a high-level case requiring a Insurance risk surveyor. Dr. Glennon Mac performed the pelvic portion of the procedure while I started initial abdominal entry. He also assisted me with the abdominal entry which was more challenging due to her habitus. He also assisted with port placement and insertion of robotic instruments. He also provided high level assistance throughout the procedure that was needed for resection of the mass, adhesiolysis, extraction of the mass, and closure of abdominal wall and incisions.  ? ? ?Gillis Ends, MD  ?

## 2022-03-04 NOTE — Anesthesia Procedure Notes (Signed)
Procedure Name: Intubation ?Date/Time: 03/04/2022 7:44 AM ?Performed by: Jonna Clark, CRNA ?Pre-anesthesia Checklist: Patient identified, Patient being monitored, Timeout performed, Emergency Drugs available and Suction available ?Patient Re-evaluated:Patient Re-evaluated prior to induction ?Oxygen Delivery Method: Circle system utilized ?Preoxygenation: Pre-oxygenation with 100% oxygen ?Induction Type: IV induction ?Ventilation: Mask ventilation without difficulty ?Laryngoscope Size: 3 and McGraph ?Grade View: Grade I ?Tube type: Oral ?Tube size: 7.0 mm ?Number of attempts: 1 ?Airway Equipment and Method: Stylet ?Placement Confirmation: ETT inserted through vocal cords under direct vision, positive ETCO2 and breath sounds checked- equal and bilateral ?Secured at: 21 cm ?Tube secured with: Tape ?Dental Injury: Teeth and Oropharynx as per pre-operative assessment  ? ? ? ? ?

## 2022-03-06 ENCOUNTER — Encounter: Payer: Self-pay | Admitting: Obstetrics and Gynecology

## 2022-03-09 DIAGNOSIS — R897 Abnormal histological findings in specimens from other organs, systems and tissues: Secondary | ICD-10-CM | POA: Diagnosis not present

## 2022-03-09 LAB — CYTOLOGY - NON PAP

## 2022-03-10 ENCOUNTER — Other Ambulatory Visit: Payer: Self-pay

## 2022-03-11 LAB — SURGICAL PATHOLOGY

## 2022-03-12 ENCOUNTER — Encounter: Payer: Self-pay | Admitting: Obstetrics and Gynecology

## 2022-03-12 ENCOUNTER — Telehealth: Payer: Self-pay

## 2022-03-12 NOTE — Telephone Encounter (Signed)
Call placed to Ms. Murthy to review pathology and make appointment for pathology review. No answer. Left voicemail to return call. ?

## 2022-03-18 ENCOUNTER — Other Ambulatory Visit: Payer: 59

## 2022-03-18 DIAGNOSIS — D3910 Neoplasm of uncertain behavior of unspecified ovary: Secondary | ICD-10-CM

## 2022-03-18 NOTE — Progress Notes (Signed)
Tumor Board Documentation ? ?Valerie Andrews was presented by Valerie Clonts, RN at our Tumor Board on 03/18/2022, which included representatives from medical oncology, radiation oncology, surgical oncology, pathology, navigation, genetics, palliative care. ? 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 ? ?Additionally, we reviewed previous medical and familial history, history of present illness, and recent lab results along with all available histopathologic and imaging studies. The tumor board considered available treatment options and made the following recommendations: ?Surgery ?dr. Theora Andrews recommends discussion of options including completion surgery. ? ?The following procedures/referrals were also placed: No orders of the defined types were placed in this encounter. ? ? ?Clinical Trial Status:    ? ?Staging used:   ? ?National site-specific guidelines   were discussed with respect to the case. ? ?Tumor board is a meeting of clinicians from various specialty areas who evaluate and discuss patients for whom a multidisciplinary approach is being considered. Final determinations in the plan of care are those of the provider(s). The responsibility for follow up of recommendations given during tumor board is that of the provider.  ? ?Today?s extended care, comprehensive team conference, Valerie Andrews was not present for the discussion and was not examined.  ? ?

## 2022-03-25 ENCOUNTER — Inpatient Hospital Stay: Payer: 59 | Attending: Obstetrics and Gynecology | Admitting: Obstetrics and Gynecology

## 2022-03-25 ENCOUNTER — Inpatient Hospital Stay: Payer: 59

## 2022-03-25 ENCOUNTER — Ambulatory Visit: Payer: 59

## 2022-03-25 VITALS — BP 138/84 | HR 59 | Temp 98.7°F | Resp 20 | Wt 285.8 lb

## 2022-03-25 DIAGNOSIS — D3910 Neoplasm of uncertain behavior of unspecified ovary: Secondary | ICD-10-CM

## 2022-03-25 DIAGNOSIS — N951 Menopausal and female climacteric states: Secondary | ICD-10-CM | POA: Diagnosis not present

## 2022-03-25 DIAGNOSIS — D3911 Neoplasm of uncertain behavior of right ovary: Secondary | ICD-10-CM | POA: Insufficient documentation

## 2022-03-25 DIAGNOSIS — Z90721 Acquired absence of ovaries, unilateral: Secondary | ICD-10-CM | POA: Insufficient documentation

## 2022-03-25 DIAGNOSIS — Z6841 Body Mass Index (BMI) 40.0 and over, adult: Secondary | ICD-10-CM | POA: Insufficient documentation

## 2022-03-25 NOTE — Patient Instructions (Addendum)
Recommendation:  ?-Your follicle stimulating hormone test is normal and this shows that your ovary is still working and producing hormones ?-Endometrial biopsy (biopsy the inside of the uterus) - this is to check and make sure you do not have any abnormal cells in the uterus. If you have cancer cells that will change your recommendation for what type of surgery is needed ?-Completion surgery  with removal of the uterus, biopsies, washings and removal of the omentum.  ?

## 2022-03-25 NOTE — Progress Notes (Signed)
Gynecologic Oncology Interval Visit  ? ?Referring Provider: ?Prentice Docker, MD ? ?Chief Concern: postop visit ? ?Subjective:  ?Valerie Andrews is a 56 y.o. [id number] who is seen in consultation from Dr. JGlennon Macfor RIGHT adnexal mixed density mass who presents for her postop visit.   ? ?On 03/04/2022 she underwent exam under anesthesia; Robotic-assisted right salpingo-oophorectomy; lysis of adhesions included enterolysis and ovariolysis, and washings. Intraop pathology concerning for lymphoma vs granulosa cell tumor. Final pathology granulosa cell tumor. Intraoperative findings included adhesions involving the omentum to the anterior pelvic wall, the appendix was adherent to the right IP ligament, the rectosigmoid was draped over the left adnexa, adherent to posterior low cervix and to the right ovary. The cyst did rupture intraoperative while trying to lyse the adhesions.    ?  ?Pathology:  ?Tumor Site: Right ovary  ?Tumor Size: Greatest dimension 6 cm  ?Histologic Type: Granulosa cell tumor, adult type  ?Histologic Grade: Not applicable  ?Ovarian Surface Involvement: Cannot be determined: Specimen received fragmented  ?Fallopian Tube Surface Involvement: Not identified ? ? ?Preop  ?FSH mIU/mL 3.1   ? ? ?Tumor Board recommendations: Discussion of options including completion surgery. ? ? ?Gyn Oncology History ?She presented to the ER on 12/06/2021 with Pt c/o RLQ abd pain that radiates to her R side that has been intermittent for the past couple of days but was worse.  ? ?12/06/2021 CT abdomen and pelvis   ?  ?IMPRESSION: ?1. A 6.7 cm RIGHT adnexal mixed density mass is nonspecific. This may reflect a hemorrhagic cyst, ovarian cystic neoplasm or sequela of RIGHT ovarian torsion. Recommend further dedicated evaluation ?with pelvic ultrasound versus pelvic MRI and consideration of ?gynecologic consultation. ?2. Appendix is unremarkable. ?3. 2 mm left solid pulmonary nodule. No routine follow-up imaging is  recommended per Fleischner Society Guidelines. These guidelines do not apply to immunocompromised patients and patients with cancer. Follow up in patients with significant comorbidities as clinically warranted. For lung cancer screening, ?adhere to Lung-RADS guidelines. Reference: Radiology. 2017; 284(1):228-43. ? ? ?12/06/2021 Pelvic [place]? Right ovary: Measurements: 5.5 x 4.5 x 4.1 cm = volume: 52 mL. There appears to ?be a predominantly solid but complex and multi septated mass within the right ovary. It appears to have blood flow. ?Left ovary Not visualized. ?IMPRESSION: Probable predominantly solid but complex and multi septated mass within the right ovary measuring at least 5 cm. Further evaluation with MRI with and without gadolinium is recommended to evaluate for possible ovarian neoplasm. ?  ? ?Patient's ultrasound is most consistent with ovarian mass.  I spoke to Dr. JGlennon Macof OB/GYN who recommends obtaining a ROMA blood test to evaluate ovarian tumor markers.  He will follow-up in the office early this week for likely referral to gynecology oncology  ? ?ROMA:  ?If the patient is postmenopausal, then the postmenopausal ROMA score  ?of less than 2.99 is consistent with a low likelihood of finding  ?a malignancy on surgery. ? ?Cancer Antigen (CA) 125 0.0 - 38.1 U/mL 13.3   ?Comment: (NOTE)  ?Roche Diagnostics Electrochemiluminescence Immunoassay ([facility]  ?Values obtained with different assay methods or kits cannot be  ?used interchangeably.  Results cannot be interpreted as absolute  ?evidence of the presence or absence of malignant disease.   ?HE4 0.0 - 105.2 pmol/L 69.9   ? ?Premenopausal Interp: HIGH Comment   ?Comment: (NOTE)  ?If the patient is premenopausal, then the premenopausal ROMA score  ?of greater than or equal to 1.14 is consistent with a  high  ?likelihood of finding a malignancy on surgery.   ?  ? ?MRI pelvis 01/30/22 ?FINDINGS: ?Urinary Tract:  No abnormality visualized. Bowel:  Descending and  sigmoid diverticulosis. ?Vascular/Lymphatic: No pathologically enlarged lymph nodes. No significant vascular abnormality seen. ?  ?Reproductive: Mixed solid cystic mass of the right ovary measuring 7.1 x 6.9 x 4.9 cm, with heterogeneous associated contrast enhancement (series 3, image 15). The left ovary is normal in size and appearance. ? ?  ?IMPRESSION: ?1. Mixed solid cystic mass of the right ovary measuring 7.1 x 6.9 x 4.9 cm, highly concerning for primary ovarian malignancy. ?2. Normal left ovary. ?3. No evidence of pelvic lymphadenopathy or metastatic disease. ?4. Descending and sigmoid diverticulosis. ? ?Surgery recommended.  ? ? ?Problem List: ?Patient Active Problem List  ? Diagnosis Date Noted  ? Granulosa cell tumor 03/25/2022  ? Adnexal mass 02/04/2022  ? Esophageal reflux 04/25/2015  ? Arthritis of both knees 04/25/2015  ? ?OB History   ? ? Gravida  ?3  ? Para  ?1  ? Term  ?   ? Preterm  ?   ? AB  ?2  ? Living  ?   ?  ? ? SAB  ?2  ? IAB  ?   ? Ectopic  ?   ? Multiple  ?   ? Live Births  ?   ?   ?  ?  ? ? ?Past Medical History: ?Past Medical History:  ?Diagnosis Date  ? Arthritis   ? BMI 45.0-49.9, adult ([place])   ? Chicken pox   ? GERD (gastroesophageal reflux disease)   ? ? ?Past Surgical History: ?Past Surgical History:  ?Procedure Laterality Date  ? ROBOTIC ASSISTED SALPINGO OOPHERECTOMY Right 03/04/2022  ? Procedure: XI ROBOTIC ASSISTED SALPINGO OOPHORECTOMY, LYSIS OF ADHESIONS;  Surgeon: Gillis Ends, MD;  Location: [facility] ORS;  Service: Gynecology;  Laterality: Right;  ? SALPINGECTOMY Left   ? ectopic  ? ? ?Past Gynecologic History:  ?Menarche: 61 ?Menstrual details: Lasts 5 days ?Menses regular: yes ?Last Menstrual Period: Unknown ?History of Abnormal pap: no,  ?Last pap: NILM; HRHPV negative 01/07/2022 ?History of STDs: trichomonas 2017 ?Contraception: None ? ? ?OB History:  ?OB History  ?Gravida Para Term Preterm AB Living  ?'3 1     2    '$ ?SAB IAB Ectopic Multiple Live Births  ?2          ?   ?# Outcome Date GA Lbr Len/2nd Weight Sex Delivery Anes PTL Lv  ?3 SAB           ?2 SAB           ?1 Para           ? ? ?Family History: ?Family History  ?Problem Relation Age of Onset  ? Hypertension Mother   ? Stroke Father   ? Diabetes Paternal Grandmother   ? Cancer Neg Hx   ? Breast cancer Neg Hx   ? ? ?Social History: ?Social History  ? ?Socioeconomic History  ? Marital status: Single  ?  Spouse name: Not on file  ? Number of children: Not on file  ? Years of education: Not on file  ? Highest education level: Not on file  ?Occupational History  ? Not on file  ?Tobacco Use  ? Smoking status: Never  ? Smokeless tobacco: Never  ?Vaping Use  ? Vaping Use: Never used  ?Substance and Sexual Activity  ? Alcohol use: No  ?  Alcohol/week: 0.0  standard drinks  ? Drug use: No  ? Sexual activity: Not Currently  ?  Birth control/protection: None  ?Other Topics Concern  ? Not on file  ?Social History Narrative  ? Not on file  ? ?Social Determinants of Health  ? ?Financial Resource Strain: Not on file  ?Food Insecurity: Not on file  ?Transportation Needs: Not on file  ?Physical Activity: Not on file  ?Stress: Not on file  ?Social Connections: Not on file  ?Intimate Partner Violence: Not on file  ? ? ?Allergies: ?Allergies  ?Allergen Reactions  ? Methocarbamol Other (See Comments)  ?  headache  ? Tramadol   ?  Other reaction(s): Other (See Comments) ?Increased pain and irritability   ? ? ?Current Medications: ?Current Outpatient Medications  ?Medication Sig Dispense Refill  ? ibuprofen (ADVIL) 600 MG tablet Take 1 tablet (600 mg total) by mouth every 6 (six) hours. 30 tablet 0  ? Multiple Vitamin (MULTIVITAMIN WITH MINERALS) TABS tablet Take 1 tablet by mouth daily.    ? trolamine salicylate (ASPERCREME) 10 % cream Apply 1 application. topically as needed for muscle pain.    ? cetirizine (ZYRTEC) 10 MG tablet Take 1 tablet (10 mg total) by mouth daily as needed. (Patient not taking: Reported on 03/25/2022)    ? meloxicam  (MOBIC) 7.5 MG tablet meloxicam 7.5 mg tablet ? Take 1 tablet every day by oral route for 1 day.    ? ondansetron (ZOFRAN-ODT) 4 MG disintegrating tablet Take 1 tablet (4 mg total) by mouth every 6 (six) hours as

## 2022-03-26 LAB — FOLLICLE STIMULATING HORMONE: FSH: 33.8 m[IU]/mL

## 2022-04-07 ENCOUNTER — Other Ambulatory Visit: Payer: 59

## 2022-04-07 DIAGNOSIS — M1990 Unspecified osteoarthritis, unspecified site: Secondary | ICD-10-CM | POA: Diagnosis not present

## 2022-04-07 DIAGNOSIS — R079 Chest pain, unspecified: Secondary | ICD-10-CM | POA: Diagnosis not present

## 2022-04-07 DIAGNOSIS — K8050 Calculus of bile duct without cholangitis or cholecystitis without obstruction: Secondary | ICD-10-CM | POA: Diagnosis not present

## 2022-04-07 DIAGNOSIS — R350 Frequency of micturition: Secondary | ICD-10-CM | POA: Diagnosis not present

## 2022-04-07 DIAGNOSIS — Z90722 Acquired absence of ovaries, bilateral: Secondary | ICD-10-CM | POA: Diagnosis not present

## 2022-04-07 DIAGNOSIS — R1011 Right upper quadrant pain: Secondary | ICD-10-CM | POA: Diagnosis not present

## 2022-04-07 DIAGNOSIS — K219 Gastro-esophageal reflux disease without esophagitis: Secondary | ICD-10-CM | POA: Diagnosis not present

## 2022-04-07 DIAGNOSIS — Z791 Long term (current) use of non-steroidal anti-inflammatories (NSAID): Secondary | ICD-10-CM | POA: Diagnosis not present

## 2022-04-08 DIAGNOSIS — R1011 Right upper quadrant pain: Secondary | ICD-10-CM | POA: Diagnosis not present

## 2022-04-14 ENCOUNTER — Other Ambulatory Visit: Payer: Self-pay

## 2022-04-14 ENCOUNTER — Observation Stay
Admission: EM | Admit: 2022-04-14 | Discharge: 2022-04-15 | Disposition: A | Discharge status: Home / Self Care | Payer: 59 | Attending: Internal Medicine | Admitting: Internal Medicine

## 2022-04-14 ENCOUNTER — Emergency Department: Payer: 59

## 2022-04-14 ENCOUNTER — Encounter: Payer: Self-pay | Admitting: Emergency Medicine

## 2022-04-14 DIAGNOSIS — N9489 Other specified conditions associated with female genital organs and menstrual cycle: Secondary | ICD-10-CM | POA: Diagnosis not present

## 2022-04-14 DIAGNOSIS — R7989 Other specified abnormal findings of blood chemistry: Secondary | ICD-10-CM | POA: Diagnosis not present

## 2022-04-14 DIAGNOSIS — D3910 Neoplasm of uncertain behavior of unspecified ovary: Secondary | ICD-10-CM | POA: Diagnosis not present

## 2022-04-14 DIAGNOSIS — Z6841 Body Mass Index (BMI) 40.0 and over, adult: Secondary | ICD-10-CM

## 2022-04-14 DIAGNOSIS — R7401 Elevation of levels of liver transaminase levels: Principal | ICD-10-CM

## 2022-04-14 DIAGNOSIS — R17 Unspecified jaundice: Secondary | ICD-10-CM | POA: Diagnosis not present

## 2022-04-14 DIAGNOSIS — K8020 Calculus of gallbladder without cholecystitis without obstruction: Secondary | ICD-10-CM | POA: Insufficient documentation

## 2022-04-14 DIAGNOSIS — R1011 Right upper quadrant pain: Principal | ICD-10-CM | POA: Insufficient documentation

## 2022-04-14 DIAGNOSIS — R748 Abnormal levels of other serum enzymes: Secondary | ICD-10-CM | POA: Diagnosis not present

## 2022-04-14 DIAGNOSIS — R000 Tachycardia, unspecified: Secondary | ICD-10-CM | POA: Diagnosis not present

## 2022-04-14 LAB — URINALYSIS, ROUTINE W REFLEX MICROSCOPIC
Glucose, UA: NEGATIVE mg/dL
Ketones, ur: 20 mg/dL — AB
Leukocytes,Ua: NEGATIVE
Nitrite: NEGATIVE
Protein, ur: 30 mg/dL — AB
Specific Gravity, Urine: 1.029 (ref 1.005–1.030)
pH: 5 (ref 5.0–8.0)

## 2022-04-14 LAB — CBC
HCT: 45.3 % (ref 36.0–46.0)
Hemoglobin: 14.7 g/dL (ref 12.0–15.0)
MCH: 22.5 pg — ABNORMAL LOW (ref 26.0–34.0)
MCHC: 32.5 g/dL (ref 30.0–36.0)
MCV: 69.4 fL — ABNORMAL LOW (ref 80.0–100.0)
Platelets: 163 10*3/uL (ref 150–400)
RBC: 6.53 MIL/uL — ABNORMAL HIGH (ref 3.87–5.11)
RDW: 17 % — ABNORMAL HIGH (ref 11.5–15.5)
WBC: 6.1 10*3/uL (ref 4.0–10.5)
nRBC: 0 % (ref 0.0–0.2)

## 2022-04-14 LAB — COMPREHENSIVE METABOLIC PANEL
ALT: 548 U/L — ABNORMAL HIGH (ref 0–44)
AST: 285 U/L — ABNORMAL HIGH (ref 15–41)
Albumin: 3.5 g/dL (ref 3.5–5.0)
Alkaline Phosphatase: 176 U/L — ABNORMAL HIGH (ref 38–126)
Anion gap: 7 (ref 5–15)
BUN: 18 mg/dL (ref 6–20)
CO2: 27 mmol/L (ref 22–32)
Calcium: 8.9 mg/dL (ref 8.9–10.3)
Chloride: 100 mmol/L (ref 98–111)
Creatinine, Ser: 0.88 mg/dL (ref 0.44–1.00)
GFR, Estimated: >60 mL/min (ref >60)
Glucose, Bld: 106 mg/dL — ABNORMAL HIGH (ref 70–99)
Potassium: 3.6 mmol/L (ref 3.5–5.1)
Sodium: 134 mmol/L — ABNORMAL LOW (ref 135–145)
Total Bilirubin: 9.3 mg/dL — ABNORMAL HIGH (ref 0.3–1.2)
Total Protein: 7.4 g/dL (ref 6.5–8.1)

## 2022-04-14 LAB — ACETAMINOPHEN LEVEL
Acetaminophen (Tylenol), Serum: 11 ug/mL (ref 10–30)
Acetaminophen (Tylenol), Serum: <10 ug/mL — ABNORMAL LOW (ref 10–30)

## 2022-04-14 LAB — APTT: aPTT: 31 seconds (ref 24–36)

## 2022-04-14 LAB — PROTIME-INR
INR: 1.1 (ref 0.8–1.2)
Prothrombin Time: 14.4 seconds (ref 11.4–15.2)

## 2022-04-14 LAB — BILIRUBIN, DIRECT: Bilirubin, Direct: 5.5 mg/dL — ABNORMAL HIGH (ref 0.0–0.2)

## 2022-04-14 LAB — LIPASE, BLOOD: Lipase: 31 U/L (ref 11–51)

## 2022-04-14 LAB — HCG, QUANTITATIVE, PREGNANCY: hCG, Beta Chain, Quant, S: 1 m[IU]/mL (ref <5)

## 2022-04-14 LAB — MONONUCLEOSIS SCREEN: Mono Screen: NEGATIVE

## 2022-04-14 MED ORDER — ADULT MULTIVITAMIN W/MINERALS CH
1.0000 | ORAL_TABLET | Freq: Every day | ORAL | Status: DC
  Administered 2022-04-14 – 2022-04-15 (×2): 1 via ORAL
  Filled 2022-04-14 (×2): qty 1

## 2022-04-14 MED ORDER — ONDANSETRON HCL 4 MG/2ML IJ SOLN: 4.0000 mg | Freq: Three times a day (TID) | INTRAMUSCULAR | Status: DC | PRN

## 2022-04-14 MED ORDER — FENTANYL CITRATE PF 50 MCG/ML IJ SOSY: 25.0000 ug | PREFILLED_SYRINGE | INTRAMUSCULAR | Status: DC | PRN

## 2022-04-14 MED ORDER — SODIUM CHLORIDE 0.9 % IV SOLN: INTRAVENOUS | Status: DC

## 2022-04-14 MED ORDER — HEPARIN SODIUM (PORCINE) 5000 UNIT/ML IJ SOLN
5000.0000 [IU] | Freq: Three times a day (TID) | INTRAMUSCULAR | Status: DC
  Administered 2022-04-14 (×2): 5000 [IU] via SUBCUTANEOUS
  Filled 2022-04-14 (×4): qty 1

## 2022-04-14 MED ORDER — OXYCODONE HCL 5 MG PO TABS: 5.0000 mg | ORAL_TABLET | Freq: Four times a day (QID) | ORAL | Status: DC | PRN

## 2022-04-14 MED ORDER — SODIUM CHLORIDE 0.9 % IV BOLUS
500.0000 mL | Freq: Once | INTRAVENOUS | Status: AC
  Administered 2022-04-14: 500 mL via INTRAVENOUS

## 2022-04-14 MED ORDER — ONDANSETRON 4 MG PO TBDP
4.0000 mg | ORAL_TABLET | Freq: Once | ORAL | Status: AC | PRN
Start: 2022-04-14 — End: 2022-04-14
  Administered 2022-04-14: 4 mg via ORAL
  Filled 2022-04-14: qty 1

## 2022-04-14 MED ORDER — LORATADINE 10 MG PO TABS: 10.0000 mg | ORAL_TABLET | Freq: Every day | ORAL | Status: DC | PRN

## 2022-04-14 MED ORDER — IBUPROFEN 400 MG PO TABS: 400.0000 mg | ORAL_TABLET | Freq: Four times a day (QID) | ORAL | Status: DC | PRN

## 2022-04-14 MED ORDER — MORPHINE SULFATE (PF) 2 MG/ML IV SOLN: 2.0000 mg | INTRAVENOUS | Status: DC | PRN

## 2022-04-14 NOTE — Assessment & Plan Note (Signed)
Right upper quadrant abdominal pain and abnormal liver function: ALP 176, AST 285, ALT 548, total bilirubin 9.3, direct bilirubin 5.5.  Etiology is not clear. Consulted Dr. Allen Norris of GI.  Tylenol level 11. RUQ-US showed ballbladder sludge and calculi. The gallbladder is with no wall thickening or fat edema to confirm acute cholecystitis.  Patient does not have fever or leukocytosis.  -Admitted to MedSurg bed as inpatient -Repeat Tylenol level -Check hepatitis panel, HIV antibody -Follow-up GI's recommendation

## 2022-04-14 NOTE — ED Provider Notes (Signed)
[facility] Provider Note    Event Date/Time   First MD Initiated Contact with Patient 04/14/22 0745     (approximate)   History   Abdominal Pain   HPI  Valerie Andrews is a 56 y.o. female   who on review of medical records has a history of recent gynecologic surgery, oophorectomy  Also has a history of arthritis obesity and esophageal reflux  She reports around [facility] Day she felt like she had a brief episode of food poisoning.  Since then she has noticed oftentimes when she eats she will have right upper abdominal pain.  She has right upper abdominal pain after eating occasionally vomits and gets nauseated.  Currently pain is gone.  She does not take any medications of the magnesium type supplement, also Tylenol as needed usually takes about 2-3 times a day and takes no more than 1000 mg up to 2-3 times a day    Currently patient reports no significant pain or nausea.   Seen at [facility] for similar a few days ago  Physical Exam   Triage Vital Signs: ED Triage Vitals  Enc Vitals Group     BP 04/14/22 0227 128/90     Pulse Rate 04/14/22 0227 (!) 101     Resp 04/14/22 0227 20     Temp 04/14/22 0227 97.6 F (36.4 C)     Temp Source 04/14/22 0227 Oral     SpO2 04/14/22 0227 96 %     Weight 04/14/22 0225 296 lb (134.3 kg)     Height 04/14/22 0225 '5\' 7"'$  (1.702 m)     Head Circumference --      Peak Flow --      Pain Score 04/14/22 0225 10     Pain Loc --      Pain Edu? --      Excl. in [place]? --     Most recent vital signs: Vitals:   04/14/22 0227 04/14/22 0554  BP: 128/90 (!) 125/54  Pulse: (!) 101 84  Resp: 20 20  Temp: 97.6 F (36.4 C) 98.2 F (36.8 C)  SpO2: 96% 95%     General: Awake, no distress.  CV:  Good peripheral perfusion.  Resp:  Normal effort.  Abd:  No distention.  No significant abdominal pain except reports mild tenderness to palpation in the right upper quadrant.  No rebound or guarding.  No evidence of  peritonitis. Other:  Lower extremities warm well perfused  No chest pain or trouble breathing.  Denies pregnancy.  ED Results / Procedures / Treatments   Labs (all labs ordered are listed, but only abnormal results are displayed) Labs Reviewed  COMPREHENSIVE METABOLIC PANEL - Abnormal; Notable for the following components:      Result Value   Sodium 134 (*)    Glucose, Bld 106 (*)    AST 285 (*)    ALT 548 (*)    Alkaline Phosphatase 176 (*)    Total Bilirubin 9.3 (*)    All other components within normal limits  CBC - Abnormal; Notable for the following components:   RBC 6.53 (*)    MCV 69.4 (*)    MCH 22.5 (*)    RDW 17.0 (*)    All other components within normal limits  BILIRUBIN, DIRECT - Abnormal; Notable for the following components:   Bilirubin, Direct 5.5 (*)    All other components within normal limits  LIPASE, BLOOD  URINALYSIS, ROUTINE W REFLEX MICROSCOPIC  PROTIME-INR  ACETAMINOPHEN LEVEL  HEPATITIS PANEL, ACUTE  HCG, QUANTITATIVE, PREGNANCY     EKG Interpreted by me at 7 AM Heart rate 110 Cures 85 QTc 460 Sinus tachycardia, occasional PVC.  Minimal nonspecific T wave abnormality    RADIOLOGY   Personally viewed the patient's right upper quadrant ultrasound, interpreted as no dilatation of the common bile duct.  Small gallstones  US ABDOMEN LIMITED RUQ (LIVER/GB)  Result Date: 04/14/2022 CLINICAL DATA:  Transaminitis EXAM: ULTRASOUND ABDOMEN LIMITED RIGHT UPPER QUADRANT COMPARISON:  12/06/2021 abdominal CT FINDINGS: Gallbladder: Focal gallbladder tenderness, although no wall thickening or pericholecystic edema. There is dependent sludge and tiny shadowing calculi. Common bile duct: Diameter: 6 mm Liver: Mildly echogenic liver diffusely when compared to the kidney. No focal masslike finding. Portal vein is patent on color Doppler imaging with normal direction of blood flow towards the liver. IMPRESSION: 1. Gallbladder sludge and calculi. The gallbladder  is tender but there is no wall thickening or fat edema to confirm acute cholecystitis. 2. Hepatic steatosis. Electronically Signed   By: Jorje Guild M.D.   On: 04/14/2022 06:08    PROCEDURES:  Critical Care performed: No  Procedures   MEDICATIONS ORDERED IN ED: Medications  sodium chloride 0.9 % bolus 500 mL (has no administration in time range)  ondansetron (ZOFRAN-ODT) disintegrating tablet 4 mg (4 mg Oral Given 04/14/22 0229)     IMPRESSION / MDM / ASSESSMENT AND PLAN / ED COURSE  I reviewed the triage vital signs and the nursing notes.                              Differential diagnosis includes, but is not limited to, choledocholithiasis, cholecystitis, acute hepatic disease, referred pain, cholangitis, or other acute intra-abdominal etiology such as mass tumor obstructing lesion pancreatitis etc.  Patient's examination at this time quite reassuring without evidence of peritonitis but her labs are quite concerning.  She has been having intermittent episodes of right upper quadrant pain now presenting with notable transaminitis and hyperbilirubinemia.  She does not appear to be taking a dose of Tylenol that would be toxic, will check acetaminophen level here.  Also await INR and have sent acute hepatitis panel  ----------------------------------------- 8:18 AM on 04/14/2022 ----------------------------------------- I discussed the case imaging and labs with Dr. Allen Norris, with gastroenterology.  He advises he will see the patient in consultation, is agreeable with the plan to admit her to the hospital here with plan GI consult.  She remain n.p.o. at this time.  Tylenol level 11  At present I do not see clear signs of an infectious process, though viral hepatitis would be a consideration.  She has no elevated white count no fever her pain is minimal if any and she has episodes of's pain when she eats.  Currently imaging is not clearly conclusive acute cholecystitis  either.  Patient's presentation is most consistent with acute presentation with potential threat to life or bodily function.  The patient is on the cardiac monitor to evaluate for evidence of arrhythmia and/or significant heart rate changes.  INR 1.1  I consulted with our hospitalist service as well as gastroenterology.  Discussed with hospitalist Dr. Blaine Hamper  Plan for admission with ongoing GI consult and further work-up pending   Preg test PENDING (patient denies pregnancy or risk thereof)  FINAL CLINICAL IMPRESSION(S) / ED DIAGNOSES   Final diagnoses:  Transaminitis  Right upper quadrant pain  Hyperbilirubinemia  Rx / DC Orders   ED Discharge Orders     None        Note:  This document was prepared using Dragon voice recognition software and may include unintentional dictation errors.   Delman Kitten, MD 04/14/22 [phone]

## 2022-04-14 NOTE — Assessment & Plan Note (Signed)
-  see above 

## 2022-04-14 NOTE — H&P (Signed)
History and Physical    Valerie Andrews WVP:[id number] DOB: 1966/01/13 DOA: 04/14/2022  Referring MD/NP/PA:   PCP: Will Bonnet, MD   Patient coming from:  The patient is coming from home.  At baseline, pt is independent for most of ADL.        Chief Complaint: Nausea, vomiting, abdominal pain  HPI: Valerie Andrews is a 56 y.o. female with medical history significant of GERD, morbid obesity with BMI of 46.36, arthritis, chickenpox, right adnexal mixed (s/p of Robotic-assisted right salpingo-oophorectomy on 03/04/2022 with final pathology granulosa cell tumor), who presents with abdominal pain, nausea, vomiting.  Patient states that his symptoms have been going on for almost 2 weeks.  Including nausea, nonbilious nonbloody vomiting and abdominal pain.  No diarrhea, fever or chills. He vomits 2 or 3 times each day.  Her abdominal pain is located in the right upper quadrant, constant, initially 9 out of 10, currently mild, radiating to left side of abdomen.  No symptoms of UTI.  Patient has mild dry cough, no chest pain or shortness of breath.  No symptoms of UTI. Patient denies drinking alcohol currently.  She states she took Tylenol 3 times a day (500 mg of pill)   Data Reviewed and ED Course: pt was found to have WBC 6.1, abnormal liver function (ALP 176, AST 285, ALT 548, bilirubin 9.3, direct bilirubin 5.5), Tylenol level 11, PTT 31 temperature normal, blood pressure 125/54, heart rate of 101, 84, RR 20, oxygen saturation 95% on room air.  Patient is admitted to [place] bed as inpatient.  Dr. Allen Norris of GI is consulted.  US-RUQ: 1. Gallbladder sludge and calculi. The gallbladder is tender but there is no wall thickening or fat edema to confirm acute cholecystitis. 2. Hepatic steatosis.   EKG: I have personally reviewed.  Sinus rhythm, QTc 463, LAE, poor R wave progression, occasional PVC   Review of Systems:   General: no fevers, chills, no body weight gain, has poor appetite, has  fatigue HEENT: no blurry vision, hearing changes or sore throat Respiratory: no dyspnea, coughing, wheezing CV: no chest pain, no palpitations GI: has nausea, vomiting, abdominal pain, no diarrhea, constipation GU: no dysuria, burning on urination, increased urinary frequency, hematuria  Ext: no leg edema Neuro: no unilateral weakness, numbness, or tingling, no vision change or hearing loss Skin: no rash, no skin tear. MSK: No muscle spasm, no deformity, no limitation of range of movement in spin Heme: No easy bruising.  Travel history: No recent long distant travel.   Allergy:  Allergies  Allergen Reactions   Methocarbamol Other (See Comments)    headache   Tramadol     Other reaction(s): Other (See Comments) Increased pain and irritability     Past Medical History:  Diagnosis Date   Arthritis    BMI 45.0-49.9, adult (HCC)    Chicken pox    GERD (gastroesophageal reflux disease)     Past Surgical History:  Procedure Laterality Date   ROBOTIC ASSISTED SALPINGO OOPHERECTOMY Right 03/04/2022   Procedure: XI ROBOTIC ASSISTED SALPINGO OOPHORECTOMY, LYSIS OF ADHESIONS;  Surgeon: Gillis Ends, MD;  Location: [facility] ORS;  Service: Gynecology;  Laterality: Right;   SALPINGECTOMY Left    ectopic    Social History:  reports that she has never smoked. She has never used smokeless tobacco. She reports that she does not drink alcohol and does not use drugs.  Family History:  Family History  Problem Relation Age of Onset   Hypertension Mother  Stroke Father    Diabetes Paternal Grandmother    Cancer Neg Hx    Breast cancer Neg Hx      Prior to Admission medications   Medication Sig Start Date End Date Taking? Authorizing Provider  cetirizine (ZYRTEC) 10 MG tablet Take 1 tablet (10 mg total) by mouth daily as needed. Patient not taking: Reported on 03/25/2022 03/04/22   Will Bonnet, MD  ibuprofen (ADVIL) 600 MG tablet Take 1 tablet (600 mg total) by mouth every  6 (six) hours. 03/04/22   Will Bonnet, MD  meloxicam (MOBIC) 7.5 MG tablet meloxicam 7.5 mg tablet  Take 1 tablet every day by oral route for 1 day.    [provider]  Multiple Vitamin (MULTIVITAMIN WITH MINERALS) TABS tablet Take 1 tablet by mouth daily.    [provider]  ondansetron (ZOFRAN-ODT) 4 MG disintegrating tablet Take 1 tablet (4 mg total) by mouth every 6 (six) hours as needed for nausea. Patient not taking: Reported on 03/25/2022 03/04/22   Will Bonnet, MD  trolamine salicylate (ASPERCREME) 10 % cream Apply 1 application. topically as needed for muscle pain.    [provider]    Physical Exam: Vitals:   04/14/22 0227 04/14/22 0554 04/14/22 0830 04/14/22 0900  BP: 128/90 (!) 125/54 127/60 118/71  Pulse: (!) 101 84 70 69  Resp: '20 20 19   '$ Temp: 97.6 F (36.4 C) 98.2 F (36.8 C)    TempSrc: Oral Oral    SpO2: 96% 95% 100% 99%  Weight:      Height:       General: Not in acute distress HEENT:       Eyes: PERRL, EOMI, has scleral icterus.       ENT: No discharge from the ears and nose, no pharynx injection, no tonsillar enlargement.        Neck: No JVD, no bruit, no mass felt. Heme: No neck lymph node enlargement. Cardiac: S1/S2, RRR, No murmurs, No gallops or rubs. Respiratory: No rales, wheezing, rhonchi or rubs. GI: Soft, nondistended, has tenderness to right upper quadrant, no rebound pain, no organomegaly, BS present. GU: No hematuria Ext: No pitting leg edema bilaterally. 1+DP/PT pulse bilaterally. Musculoskeletal: No joint deformities, No joint redness or warmth, no limitation of ROM in spin. Skin: No rashes.  Neuro: Alert, oriented X3, cranial nerves II-XII grossly intact, moves all extremities normally.  Psych: Patient is not psychotic, no suicidal or hemocidal ideation.  Labs on Admission: I have personally reviewed following labs and imaging studies  CBC: Recent Labs  Lab 04/14/22 0227  WBC 6.1  HGB 14.7  HCT  45.3  MCV 69.4*  PLT 500   Basic Metabolic Panel: Recent Labs  Lab 04/14/22 0227  NA 134*  K 3.6  CL 100  CO2 27  GLUCOSE 106*  BUN 18  CREATININE 0.88  CALCIUM 8.9   GFR: Estimated Creatinine Clearance: 104.6 mL/min (by C-G formula based on SCr of 0.88 mg/dL). Liver Function Tests: Recent Labs  Lab 04/14/22 0227  AST 285*  ALT 548*  ALKPHOS 176*  BILITOT 9.3*  PROT 7.4  ALBUMIN 3.5   Recent Labs  Lab 04/14/22 0227  LIPASE 31   No results for input(s): AMMONIA in the last 168 hours. Coagulation Profile: Recent Labs  Lab 04/14/22 0823  INR 1.1   Cardiac Enzymes: No results for input(s): CKTOTAL, CKMB, CKMBINDEX, TROPONINI in the last 168 hours. BNP (last 3 results) No results for input(s): PROBNP in  the last 8760 hours. HbA1C: No results for input(s): HGBA1C in the last 72 hours. CBG: No results for input(s): GLUCAP in the last 168 hours. Lipid Profile: No results for input(s): CHOL, HDL, LDLCALC, TRIG, CHOLHDL, LDLDIRECT in the last 72 hours. Thyroid Function Tests: No results for input(s): TSH, T4TOTAL, FREET4, T3FREE, THYROIDAB in the last 72 hours. Anemia Panel: No results for input(s): VITAMINB12, FOLATE, FERRITIN, TIBC, IRON, RETICCTPCT in the last 72 hours. Urine analysis:    Component Value Date/Time   COLORURINE AMBER (A) 04/14/2022 1115   APPEARANCEUR CLEAR (A) 04/14/2022 1115   LABSPEC 1.029 04/14/2022 1115   PHURINE 5.0 04/14/2022 1115   GLUCOSEU NEGATIVE 04/14/2022 1115   HGBUR SMALL (A) 04/14/2022 1115   BILIRUBINUR MODERATE (A) 04/14/2022 1115   BILIRUBINUR ++ 08/25/2018 1539   KETONESUR 20 (A) 04/14/2022 1115   PROTEINUR 30 (A) 04/14/2022 1115   UROBILINOGEN 1.0 10/10/2019 1302   NITRITE NEGATIVE 04/14/2022 1115   LEUKOCYTESUR NEGATIVE 04/14/2022 1115   Sepsis Labs: '@LABRCNTIP'$ (procalcitonin:4,lacticidven:4) )No results found for this or any previous visit (from the past 240 hour(s)).   Radiological Exams on Admission: US  ABDOMEN LIMITED RUQ (LIVER/GB)  Result Date: 04/14/2022 CLINICAL DATA:  Transaminitis EXAM: ULTRASOUND ABDOMEN LIMITED RIGHT UPPER QUADRANT COMPARISON:  12/06/2021 abdominal CT FINDINGS: Gallbladder: Focal gallbladder tenderness, although no wall thickening or pericholecystic edema. There is dependent sludge and tiny shadowing calculi. Common bile duct: Diameter: 6 mm Liver: Mildly echogenic liver diffusely when compared to the kidney. No focal masslike finding. Portal vein is patent on color Doppler imaging with normal direction of blood flow towards the liver. IMPRESSION: 1. Gallbladder sludge and calculi. The gallbladder is tender but there is no wall thickening or fat edema to confirm acute cholecystitis. 2. Hepatic steatosis. Electronically Signed   By: Jorje Guild M.D.   On: 04/14/2022 06:08      Assessment/Plan Principal Problem:   Right upper quadrant abdominal pain Active Problems:   Abnormal LFTs   Gallstone   Adnexal mass   Granulosa cell tumor   BMI 45.0-49.9, adult (HCC)   Assessment and Plan: * Right upper quadrant abdominal pain Right upper quadrant abdominal pain and abnormal liver function: ALP 176, AST 285, ALT 548, total bilirubin 9.3, direct bilirubin 5.5.  Etiology is not clear. Consulted Dr. Allen Norris of GI.  Tylenol level 11. RUQ-US showed ballbladder sludge and calculi. The gallbladder is with no wall thickening or fat edema to confirm acute cholecystitis.  Patient does not have fever or leukocytosis.  -Admitted to MedSurg bed as inpatient -Repeat Tylenol level -Check hepatitis panel, HIV antibody -Follow-up GI's recommendation  Abnormal LFTs -see above  Gallstone -see above  Adnexal mass Pt had right adnexal mass, s/p of Robotic-assisted right salpingo-oophorectomy on 03/04/2022, with final pathology granulosa cell tumor. - Pt is following up with OB/GYN, Dr. Venida Jarvis    Granulosa cell tumor -see above  BMI 45.0-49.9, adult ([place])  BMI= 46.36    and BW= 134 -Diet and exercise.   -Encouraged to lose weight.              DVT ppx: SQ Heparin    Code Status: Full code  Family Communication: Patient states that she already talked to her mother.  She states that I do not need to call her mother.  Disposition Plan:  Anticipate discharge back to previous environment  Consults called:  Dr. Allen Norris of GI  Admission status and Level of care: Med-Surg:   as inpt  Severity of Illness:  The appropriate patient status for this patient is INPATIENT. Inpatient status is judged to be reasonable and necessary in order to provide the required intensity of service to ensure the patient's safety. The patient's presenting symptoms, physical exam findings, and initial radiographic and laboratory data in the context of their chronic comorbidities is felt to place them at high risk for further clinical deterioration. Furthermore, it is not anticipated that the patient will be medically stable for discharge from the hospital within 2 midnights of admission.   * I certify that at the point of admission it is my clinical judgment that the patient will require inpatient hospital care spanning beyond 2 midnights from the point of admission due to high intensity of service, high risk for further deterioration and high frequency of surveillance required.*       Date of Service 04/14/2022    Ivor Costa [facility]   If 7PM-7AM, please contact night-coverage [email or web address] 04/14/2022, 2:35 PM

## 2022-04-14 NOTE — Consult Note (Signed)
Valerie Lame, MD [facility]  [address], [place], [place] [address] Phone: [phone] Fax : [phone]  Consultation  Referring Provider:     Dr. Blaine Hamper Primary Care Physician:  Will Bonnet, MD Primary Gastroenterologist: Althia Forts         Reason for Consultation:     Acute hepatitis  Date of Admission:  04/14/2022 Date of Consultation:  04/14/2022         HPI:   Valerie Andrews is a 56 y.o. female who reports right upper abdominal pain since January.  The patient had recently gone to [facility] for the abdominal pain and had been found to have abnormal liver enzymes with the AST of 322 and the ALT of 128.  The patient's hemoglobin was 13.1 with a MCV of 71.4 at that time.  The patient's INR was 1.09 visit on May 30.  The patient had an ultrasound of the right upper quadrant at that time that showed some sludge in the gallbladder without stones or signs of infection. The patient had reported that she had been having pain worsening when she was eating and had a oophorectomy and hysterectomy in April of this year.  At [facility] emergency department the patient had reported that the pain was also in the left upper quadrant not only the right upper quadrant.  It was recommended the patient follow-up with surgery as an outpatient for possible gallbladder issues. During this admission the patient reported that her symptoms included nausea with vomiting and abdominal pain without any diarrhea fevers or chills.  She had reported that she was vomiting 2-3 times a day.  She also reports that she was taking Tylenol 3 times a day and states that she was taking 1 to 2 pills.  She also reports that she was taking multiple doses of Advil every 6 hours.  She also endorses taking a medication for her knees but is not sure what it was.  On admission to this hospital the patient's bile duct was noted to be 6 mm with her AST of 285 with an ALT of 548 with an alkaline phosphatase of 176 and a total bilirubin of  9.3. The patient reports that at the end of May she had a sore throat and so did her mother and her mother's boyfriend.  Past Medical History:  Diagnosis Date   Arthritis    BMI 45.0-49.9, adult ([place])    Chicken pox    GERD (gastroesophageal reflux disease)     Past Surgical History:  Procedure Laterality Date   ROBOTIC ASSISTED SALPINGO OOPHERECTOMY Right 03/04/2022   Procedure: XI ROBOTIC ASSISTED SALPINGO OOPHORECTOMY, LYSIS OF ADHESIONS;  Surgeon: Gillis Ends, MD;  Location: [facility] ORS;  Service: Gynecology;  Laterality: Right;   SALPINGECTOMY Left    ectopic    Prior to Admission medications   Medication Sig Start Date End Date Taking? Authorizing Provider  acetaminophen (TYLENOL) 500 MG tablet Take 1,000 mg by mouth every 6 (six) hours as needed for headache, mild pain, moderate pain or fever.   Yes [provider]  Multiple Vitamin (MULTIVITAMIN WITH MINERALS) TABS tablet Take 1 tablet by mouth daily.   Yes [provider]  cetirizine (ZYRTEC) 10 MG tablet Take 10 mg by mouth daily as needed. 03/04/22   Will Bonnet, MD  ibuprofen (ADVIL) 600 MG tablet Take 1 tablet (600 mg total) by mouth every 6 (six) hours. 03/04/22   Will Bonnet, MD  meloxicam (MOBIC) 7.5 MG  tablet meloxicam 7.5 mg tablet  Take 1 tablet every day by oral route for 1 day. Patient not taking: Reported on 04/14/2022    [provider]  ondansetron (ZOFRAN-ODT) 4 MG disintegrating tablet Take 1 tablet (4 mg total) by mouth every 6 (six) hours as needed for nausea. Patient not taking: Reported on 03/25/2022 03/04/22   Will Bonnet, MD  trolamine salicylate (ASPERCREME) 10 % cream Apply 1 application. topically as needed for muscle pain.    [provider]    Family History  Problem Relation Age of Onset   Hypertension Mother    Stroke Father    Diabetes Paternal Grandmother    Cancer Neg Hx    Breast cancer Neg Hx      Social History   Tobacco  Use   Smoking status: Never   Smokeless tobacco: Never  Vaping Use   Vaping Use: Never used  Substance Use Topics   Alcohol use: No    Alcohol/week: 0.0 standard drinks   Drug use: No    Allergies as of 04/14/2022 - Review Complete 04/14/2022  Allergen Reaction Noted   Methocarbamol Other (See Comments) 02/26/2017   Tramadol  01/07/2022    Review of Systems:    All systems reviewed and negative except where noted in HPI.   Physical Exam:  Vital signs in last 24 hours: Temp:  [97.6 F (36.4 C)-98.2 F (36.8 C)] 98.2 F (36.8 C) (06/06 0554) Pulse Rate:  [69-101] 69 (06/06 0900) Resp:  [19-20] 19 (06/06 0830) BP: (118-128)/(54-90) 118/71 (06/06 0900) SpO2:  [95 %-100 %] 99 % (06/06 0900) Weight:  [134.3 kg] 134.3 kg (06/06 0225)   General:   Pleasant, cooperative in NAD Head:  Normocephalic and atraumatic. Eyes:   No icterus.   Conjunctiva pink. PERRLA. Ears:  Normal auditory acuity. Neck:  Supple; no masses or thyroidomegaly Lungs: Respirations even and unlabored. Lungs clear to auscultation bilaterally.   No wheezes, crackles, or rhonchi.  Heart:  Regular rate and rhythm;  Without murmur, clicks, rubs or gallops Abdomen:  Soft, nondistended, mild tenderness in the right upper quadrant. Normal bowel sounds. No appreciable masses or hepatomegaly.  No rebound or guarding.  Rectal:  Not performed. Msk:  Symmetrical without gross deformities.    Extremities:  Without edema, cyanosis or clubbing. Neurologic:  Alert and oriented x3;  grossly normal neurologically. Skin:  Intact without significant lesions or rashes. Cervical Nodes:  No significant cervical adenopathy. Psych:  Alert and cooperative. Normal affect.  LAB RESULTS: Recent Labs    04/14/22 0227  WBC 6.1  HGB 14.7  HCT 45.3  PLT 163   BMET Recent Labs    04/14/22 0227  NA 134*  K 3.6  CL 100  CO2 27  GLUCOSE 106*  BUN 18  CREATININE 0.88  CALCIUM 8.9   LFT Recent Labs    04/14/22 0227  PROT  7.4  ALBUMIN 3.5  AST 285*  ALT 548*  ALKPHOS 176*  BILITOT 9.3*  BILIDIR 5.5*   PT/INR Recent Labs    04/14/22 0823  LABPROT 14.4  INR 1.1    STUDIES: US ABDOMEN LIMITED RUQ (LIVER/GB)  Result Date: 04/14/2022 CLINICAL DATA:  Transaminitis EXAM: ULTRASOUND ABDOMEN LIMITED RIGHT UPPER QUADRANT COMPARISON:  12/06/2021 abdominal CT FINDINGS: Gallbladder: Focal gallbladder tenderness, although no wall thickening or pericholecystic edema. There is dependent sludge and tiny shadowing calculi. Common bile duct: Diameter: 6 mm Liver: Mildly echogenic liver diffusely when compared to the kidney. No focal  masslike finding. Portal vein is patent on color Doppler imaging with normal direction of blood flow towards the liver. IMPRESSION: 1. Gallbladder sludge and calculi. The gallbladder is tender but there is no wall thickening or fat edema to confirm acute cholecystitis. 2. Hepatic steatosis. Electronically Signed   By: Jorje Guild M.D.   On: 04/14/2022 06:08      Impression / Plan:   Assessment: Principal Problem:   Right upper quadrant abdominal pain Active Problems:   Adnexal mass   Granulosa cell tumor   Abnormal LFTs   Gallstone   BMI 45.0-49.9, adult ([place])   Valerie Andrews is a 56 y.o. y/o female with abnormal liver enzymes most consistent with hepatocellular damage and not obstructive jaundice.  The patient's alkaline phosphatase is low in relation to her bilirubin.  The patient also had elevated AST and ALT last week at [facility] with a normal bilirubin which does not support an obstructive picture.    Plan:   The patient has an acute hepatitis panel pending.  I will also send off a test for mononucleosis and she has had a sore throat with other sick contacts.  In addition I recommend following her synthetic function with a daily INR.  The patient also has had her blood sent off for ANA, SMA, AMA, alpha-1 antitrypsin and ceruloplasmin.  The patient will also have EBV and CMV  checked.  This can be a viral infection versus a drug reaction but unlikely an obstructive pattern from a common bile duct occlusion.  The patient has been explained the plan and agrees with it.  Thank you for involving me in the care of this patient.      LOS: 0 days   Valerie Lame, MD, [facility] 04/14/2022, 2:42 PM,  Pager [phone] 7am-5pm  Check AMION for 5pm -7am coverage and on weekends   Note: This dictation was prepared with Dragon dictation along with smaller phrase technology. Any transcriptional errors that result from this process are unintentional.

## 2022-04-14 NOTE — Assessment & Plan Note (Signed)
Pt had right adnexal mass, s/p of Robotic-assisted right salpingo-oophorectomy on 03/04/2022, with final pathology granulosa cell tumor. - Pt is following up with OB/GYN, Dr. Venida Jarvis

## 2022-04-14 NOTE — ED Triage Notes (Signed)
Pt presents via POV with complaints of upper abdominal pain that started last week with associated N/V. PTA she took aleve which didn't improve her sx.  Denies CP or SOB.

## 2022-04-14 NOTE — Assessment & Plan Note (Signed)
  BMI= 46.36   and BW= 134 -Diet and exercise.   -Encouraged to lose weight.

## 2022-04-15 ENCOUNTER — Encounter: Payer: Self-pay | Admitting: Internal Medicine

## 2022-04-15 ENCOUNTER — Ambulatory Visit: Payer: 59

## 2022-04-15 ENCOUNTER — Inpatient Hospital Stay: Payer: 59 | Attending: Obstetrics and Gynecology

## 2022-04-15 DIAGNOSIS — R1011 Right upper quadrant pain: Secondary | ICD-10-CM | POA: Diagnosis not present

## 2022-04-15 DIAGNOSIS — N939 Abnormal uterine and vaginal bleeding, unspecified: Secondary | ICD-10-CM | POA: Insufficient documentation

## 2022-04-15 DIAGNOSIS — D3911 Neoplasm of uncertain behavior of right ovary: Secondary | ICD-10-CM | POA: Insufficient documentation

## 2022-04-15 DIAGNOSIS — K8020 Calculus of gallbladder without cholecystitis without obstruction: Secondary | ICD-10-CM | POA: Diagnosis not present

## 2022-04-15 DIAGNOSIS — R911 Solitary pulmonary nodule: Secondary | ICD-10-CM | POA: Insufficient documentation

## 2022-04-15 DIAGNOSIS — R7989 Other specified abnormal findings of blood chemistry: Secondary | ICD-10-CM | POA: Diagnosis not present

## 2022-04-15 LAB — PROTIME-INR
INR: 1.1 (ref 0.8–1.2)
Prothrombin Time: 14.5 seconds (ref 11.4–15.2)

## 2022-04-15 LAB — COMPREHENSIVE METABOLIC PANEL
ALT: 333 U/L — ABNORMAL HIGH (ref 0–44)
AST: 130 U/L — ABNORMAL HIGH (ref 15–41)
Albumin: 2.9 g/dL — ABNORMAL LOW (ref 3.5–5.0)
Alkaline Phosphatase: 136 U/L — ABNORMAL HIGH (ref 38–126)
Anion gap: 5 (ref 5–15)
BUN: 12 mg/dL (ref 6–20)
CO2: 26 mmol/L (ref 22–32)
Calcium: 8.5 mg/dL — ABNORMAL LOW (ref 8.9–10.3)
Chloride: 106 mmol/L (ref 98–111)
Creatinine, Ser: 0.79 mg/dL (ref 0.44–1.00)
GFR, Estimated: >60 mL/min (ref >60)
Glucose, Bld: 92 mg/dL (ref 70–99)
Potassium: 4 mmol/L (ref 3.5–5.1)
Sodium: 137 mmol/L (ref 135–145)
Total Bilirubin: 3.7 mg/dL — ABNORMAL HIGH (ref 0.3–1.2)
Total Protein: 6 g/dL — ABNORMAL LOW (ref 6.5–8.1)

## 2022-04-15 LAB — EPSTEIN-BARR VIRUS (EBV) ANTIBODY PROFILE
EBV NA IgG: >600 U/mL — ABNORMAL HIGH (ref 0.0–17.9)
EBV VCA IgG: >600 U/mL — ABNORMAL HIGH (ref 0.0–17.9)
EBV VCA IgM: <36 U/mL (ref 0.0–35.9)

## 2022-04-15 LAB — HIV ANTIBODY (ROUTINE TESTING W REFLEX): HIV Screen 4th Generation wRfx: NONREACTIVE

## 2022-04-15 LAB — GLUCOSE, CAPILLARY: Glucose-Capillary: 93 mg/dL (ref 70–99)

## 2022-04-15 LAB — CMV IGM: CMV IgM: <30 AU/mL (ref 0.0–29.9)

## 2022-04-15 LAB — CMV ANTIBODY, IGG (EIA): CMV Ab - IgG: >10 U/mL — ABNORMAL HIGH (ref 0.00–0.59)

## 2022-04-15 LAB — ANA W/REFLEX: Anti Nuclear Antibody (ANA): NEGATIVE

## 2022-04-15 LAB — HCV INTERPRETATION

## 2022-04-15 NOTE — TOC CM/SW Note (Signed)
Patient has orders to discharge home today. Chart reviewed. PCP is Prentice Docker, MD. On room air. No wounds. No TOC needs identified. CSW signing off.  Dayton Scrape, [place]

## 2022-04-15 NOTE — Discharge Summary (Signed)
Physician Discharge Summary   Patient: Valerie Andrews MRN: [id number] DOB: 03/19/1966  Admit date:     04/14/2022  Discharge date: 04/15/22  Discharge Physician: Jennye Boroughs   PCP: Will Bonnet, MD   Recommendations at discharge:   Follow up with Dr. Allen Norris, gastroenterologist, in 1 week.  Follow up with PCP in 1 to 2 weeks  Discharge Diagnoses: Principal Problem:   Right upper quadrant abdominal pain Active Problems:   Abnormal LFTs   Gallstone   Adnexal mass   Granulosa cell tumor   BMI 45.0-49.9, adult ([facility])  Resolved Problems:   * No resolved hospital problems. *  Hospital Course: Valerie Andrews is a 56 y.o. female with medical history significant of GERD, morbid obesity with BMI of 46.36, arthritis, chickenpox, right adnexal mixed (s/p of Robotic-assisted right salpingo-oophorectomy on 03/04/2022 with final pathology granulosa cell tumor).  She presented to the hospital with nausea, vomiting and abdominal pain.  Symptoms have been going on for almost 2 weeks.  She has been taking Tylenol about 3 times a day for pain.  She was found to have elevated liver enzymes including hyperbilirubinemia.  Her albumin and INR were normal on presentation.  Liver ultrasound showed gallbladder sludge and calculi and hepatic steatosis.  There was no evidence of biliary tract dilatation.  She was treated with IV fluids, analgesics and antiemetics.  Her symptoms have improved.  Liver enzymes are trending down.  She was evaluated by the gastroenterologist who recommended outpatient follow-up for further management.  She is deemed stable for discharge to home today.  She has been advised to avoid Tylenol and NSAIDs until further recommendations from PCP or gastroenterologist.     Consultants: Gastroenterologist Procedures performed: None Disposition: Home Diet recommendation:  Discharge Diet Orders (From admission, onward)     Start     Ordered   04/15/22 0000  Diet - low sodium heart  healthy        04/15/22 1453           Cardiac diet DISCHARGE MEDICATION: Allergies as of 04/15/2022       Reactions   Methocarbamol Other (See Comments)   headache   Tramadol    Other reaction(s): Other (See Comments) Increased pain and irritability         Medication List     STOP taking these medications    acetaminophen 500 MG tablet Commonly known as: TYLENOL   ibuprofen 600 MG tablet Commonly known as: ADVIL   meloxicam 7.5 MG tablet Commonly known as: MOBIC   ondansetron 4 MG disintegrating tablet Commonly known as: ZOFRAN-ODT       TAKE these medications    cetirizine 10 MG tablet Commonly known as: ZYRTEC Take 10 mg by mouth daily as needed.   multivitamin with minerals Tabs tablet Take 1 tablet by mouth daily.   trolamine salicylate 10 % cream Commonly known as: ASPERCREME Apply 1 application. topically as needed for muscle pain.        Discharge Exam: Filed Weights   04/14/22 0225  Weight: 134.3 kg   GEN: NAD SKIN: No rash EYES: EOMI ENT: MMM CV: RRR PULM: CTA B ABD: soft, obese, NT, +BS CNS: AAO x 3, non focal EXT: No edema or tenderness   Condition at discharge: good  The results of significant diagnostics from this hospitalization (including imaging, microbiology, ancillary and laboratory) are listed below for reference.   Imaging Studies: US ABDOMEN LIMITED RUQ (LIVER/GB)  Result Date: 04/14/2022  CLINICAL DATA:  Transaminitis EXAM: ULTRASOUND ABDOMEN LIMITED RIGHT UPPER QUADRANT COMPARISON:  12/06/2021 abdominal CT FINDINGS: Gallbladder: Focal gallbladder tenderness, although no wall thickening or pericholecystic edema. There is dependent sludge and tiny shadowing calculi. Common bile duct: Diameter: 6 mm Liver: Mildly echogenic liver diffusely when compared to the kidney. No focal masslike finding. Portal vein is patent on color Doppler imaging with normal direction of blood flow towards the liver. IMPRESSION: 1.  Gallbladder sludge and calculi. The gallbladder is tender but there is no wall thickening or fat edema to confirm acute cholecystitis. 2. Hepatic steatosis. Electronically Signed   By: Jorje Guild M.D.   On: 04/14/2022 06:08    Microbiology: Results for orders placed or performed during the hospital encounter of 12/06/21  Urine Culture     Status: Abnormal   Collection Time: 12/06/21 10:13 AM   Specimen: Urine, Clean Catch  Result Value Ref Range Status   Specimen Description   Final    URINE, CLEAN CATCH Performed at [facility], [address] [place], [place] [address]    Special Requests   Final    NONE Performed at [facility], [address] [place], [place] [address]    Culture >=100,000 COLONIES/mL ENTEROCOCCUS FAECALIS (A)  Final   Report Status 12/09/2021 FINAL  Final   Organism ID, Bacteria ENTEROCOCCUS FAECALIS (A)  Final      Susceptibility   Enterococcus faecalis - MIC*    AMPICILLIN <=2 SENSITIVE Sensitive     NITROFURANTOIN <=16 SENSITIVE Sensitive     VANCOMYCIN 1 SENSITIVE Sensitive     * >=100,000 COLONIES/mL ENTEROCOCCUS FAECALIS  Resp Panel by RT-PCR (Flu A&B, Covid) Nasopharyngeal Swab     Status: None   Collection Time: 12/06/21  2:27 PM   Specimen: Nasopharyngeal Swab; Nasopharyngeal(NP) swabs in vial transport medium  Result Value Ref Range Status   SARS Coronavirus 2 by RT PCR NEGATIVE NEGATIVE Final    Comment: (NOTE) SARS-CoV-2 target nucleic acids are NOT DETECTED.  The SARS-CoV-2 RNA is generally detectable in upper respiratory specimens during the acute phase of infection. The lowest concentration of SARS-CoV-2 viral copies this assay can detect is 138 copies/mL. A negative result does not preclude SARS-Cov-2 infection and should not be used as the sole basis for treatment or other patient management decisions. A negative result may occur with  improper specimen collection/handling, submission of specimen other than  nasopharyngeal swab, presence of viral mutation(s) within the areas targeted by this assay, and inadequate number of viral copies(<138 copies/mL). A negative result must be combined with clinical observations, patient history, and epidemiological information. The expected result is Negative.  Fact Sheet for Patients:  EntrepreneurPulse.com.au  Fact Sheet for Healthcare Providers:  IncredibleEmployment.be  This test is no t yet approved or cleared by the [place] FDA and  has been authorized for detection and/or diagnosis of SARS-CoV-2 by FDA under an Emergency Use Authorization (EUA). This EUA will remain  in effect (meaning this test can be used) for the duration of the COVID-19 declaration under Section 564(b)(1) of the Act, 21 U.S.C.section 360bbb-3(b)(1), unless the authorization is terminated  or revoked sooner.       Influenza A by PCR NEGATIVE NEGATIVE Final   Influenza B by PCR NEGATIVE NEGATIVE Final    Comment: (NOTE) The Xpert Xpress SARS-CoV-2/FLU/RSV plus assay is intended as an aid in the diagnosis of influenza from Nasopharyngeal swab specimens and should not be used as a sole basis for treatment. Nasal  washings and aspirates are unacceptable for Xpert Xpress SARS-CoV-2/FLU/RSV testing.  Fact Sheet for Patients: EntrepreneurPulse.com.au  Fact Sheet for Healthcare Providers: IncredibleEmployment.be  This test is not yet approved or cleared by the [place] FDA and has been authorized for detection and/or diagnosis of SARS-CoV-2 by FDA under an Emergency Use Authorization (EUA). This EUA will remain in effect (meaning this test can be used) for the duration of the COVID-19 declaration under Section 564(b)(1) of the Act, 21 U.S.C. section 360bbb-3(b)(1), unless the authorization is terminated or revoked.  Performed at [facility], [address] [place],   [address]     Labs: CBC: Recent Labs  Lab 04/14/22 0227  WBC 6.1  HGB 14.7  HCT 45.3  MCV 69.4*  PLT 294   Basic Metabolic Panel: Recent Labs  Lab 04/14/22 0227 04/15/22 0444  NA 134* 137  K 3.6 4.0  CL 100 106  CO2 27 26  GLUCOSE 106* 92  BUN 18 12  CREATININE 0.88 0.79  CALCIUM 8.9 8.5*   Liver Function Tests: Recent Labs  Lab 04/14/22 0227 04/15/22 0444  AST 285* 130*  ALT 548* 333*  ALKPHOS 176* 136*  BILITOT 9.3* 3.7*  PROT 7.4 6.0*  ALBUMIN 3.5 2.9*   CBG: Recent Labs  Lab 04/15/22 0817  GLUCAP 93    Discharge time spent: greater than 30 minutes.  Signed: Jennye Boroughs, MD [facility] 04/15/2022

## 2022-04-15 NOTE — Progress Notes (Signed)
Valerie Lame, MD [facility]   [address], [place], [place] [address] Phone: [phone] Fax : [phone]   Subjective: The patient has no complaints this morning.  Her labs have significantly improved with her liver enzymes this morning compared to yesterday have shown:  Component     Latest Ref Rng 04/14/2022 04/15/2022  Total Bilirubin     0.3 - 1.2 mg/dL 9.3 (H)  3.7 (H)   Alkaline Phosphatase     38 - 126 U/L 176 (H)  136 (H)   AST     15 - 41 U/L 285 (H)  130 (H)   ALT     0 - 44 U/L 548 (H)  333 (H)       Objective: Vital signs in last 24 hours: Vitals:   04/14/22 1727 04/14/22 1944 04/15/22 0448 04/15/22 0820  BP: 128/73 132/78 (!) 97/53 105/64  Pulse: 90 83 68 65  Resp: '16 18 18 16  '$ Temp: (!) 97.4 F (36.3 C) 98 F (36.7 C) 97.8 F (36.6 C) 98 F (36.7 C)  TempSrc: Oral     SpO2: (!) 89% 100% 100% 100%  Weight:      Height:       Weight change:   Intake/Output Summary (Last 24 hours) at 04/15/2022 1154 Last data filed at 04/15/2022 1032 Gross per 24 hour  Intake 520 ml  Output --  Net 520 ml     Exam: Heart:: Regular rate and rhythm, S1S2 present, or without murmur or extra heart sounds Lungs: normal and clear to auscultation and percussion Abdomen: soft, nontender, normal bowel sounds   Lab Results: '@LABTEST2'$ @ Micro Results: No results found for this or any previous visit (from the past 240 hour(s)). Studies/Results: US ABDOMEN LIMITED RUQ (LIVER/GB)  Result Date: 04/14/2022 CLINICAL DATA:  Transaminitis EXAM: ULTRASOUND ABDOMEN LIMITED RIGHT UPPER QUADRANT COMPARISON:  12/06/2021 abdominal CT FINDINGS: Gallbladder: Focal gallbladder tenderness, although no wall thickening or pericholecystic edema. There is dependent sludge and tiny shadowing calculi. Common bile duct: Diameter: 6 mm Liver: Mildly echogenic liver diffusely when compared to the kidney. No focal masslike finding. Portal vein is patent on color Doppler imaging with normal direction  of blood flow towards the liver. IMPRESSION: 1. Gallbladder sludge and calculi. The gallbladder is tender but there is no wall thickening or fat edema to confirm acute cholecystitis. 2. Hepatic steatosis. Electronically Signed   By: Jorje Guild M.D.   On: 04/14/2022 06:08   Medications: I have reviewed the patient's current medications. Scheduled Meds:  heparin  5,000 Units Subcutaneous Q8H   multivitamin with minerals  1 tablet Oral Daily   Continuous Infusions:  sodium chloride 100 mL/hr at 04/15/22 0926   PRN Meds:.fentaNYL (SUBLIMAZE) injection, ibuprofen, loratadine, ondansetron (ZOFRAN) IV, oxyCODONE   Assessment: Principal Problem:   Right upper quadrant abdominal pain Active Problems:   Adnexal mass   Granulosa cell tumor   Abnormal LFTs   Gallstone   BMI 45.0-49.9, adult ([facility])    Plan: The patient likely had some toxic exposure or viral illness causing the rapid increase and rapid decrease of her liver enzymes.  The patient has been told to avoid any further NSAIDs or Tylenol.  Nothing further to do from a GI point of view and the patient should follow-up as an outpatient.  I will sign off.  Please call if any further GI concerns or questions.  We would like to thank you for the opportunity to participate in the care of Valerie Andrews.    LOS: 1 day   Lewayne Bunting 04/15/2022, 11:54 AM Pager [phone] 7am-5pm  Check AMION for 5pm -7am coverage and on weekends

## 2022-04-16 LAB — HEPATITIS PANEL, ACUTE

## 2022-04-16 LAB — CERULOPLASMIN: Ceruloplasmin: 22.5 mg/dL (ref 19.0–39.0)

## 2022-04-16 LAB — ALPHA-1-ANTITRYPSIN: A-1 Antitrypsin, Ser: 171 mg/dL (ref 101–187)

## 2022-04-17 DIAGNOSIS — R300 Dysuria: Secondary | ICD-10-CM | POA: Diagnosis not present

## 2022-04-17 DIAGNOSIS — N898 Other specified noninflammatory disorders of vagina: Secondary | ICD-10-CM | POA: Diagnosis not present

## 2022-04-17 LAB — ANTI-SMOOTH MUSCLE ANTIBODY, IGG: F-Actin IgG: 16 Units (ref 0–19)

## 2022-04-17 LAB — MITOCHONDRIAL ANTIBODIES: Mitochondrial M2 Ab, IgG: <20 Units (ref 0.0–20.0)

## 2022-04-22 DIAGNOSIS — N939 Abnormal uterine and vaginal bleeding, unspecified: Secondary | ICD-10-CM | POA: Diagnosis not present

## 2022-04-22 DIAGNOSIS — D3911 Neoplasm of uncertain behavior of right ovary: Secondary | ICD-10-CM | POA: Diagnosis not present

## 2022-04-28 ENCOUNTER — Ambulatory Visit (INDEPENDENT_AMBULATORY_CARE_PROVIDER_SITE_OTHER): Payer: 59 | Admitting: Gastroenterology

## 2022-04-28 ENCOUNTER — Encounter: Payer: Self-pay | Admitting: Gastroenterology

## 2022-04-28 VITALS — BP 106/71 | HR 101 | Temp 98.1°F | Ht 67.0 in | Wt 270.0 lb

## 2022-04-28 DIAGNOSIS — R748 Abnormal levels of other serum enzymes: Secondary | ICD-10-CM

## 2022-04-28 NOTE — Progress Notes (Signed)
Primary Care Physician: Will Bonnet, MD  Primary Gastroenterologist:  Dr. [name]  Chief Complaint  Patient presents with   Hospitalization Follow-up    Still having RUQ abd pain intermittent after eating along w/ N/V    HPI: Valerie Andrews is a 56 y.o. female here for follow-up after being in the hospital for abnormal liver enzymes.  The patient had her liver enzymes come down during her hospital stay but not to normal.  The patient states that she has been taking antibiotics prior to the hospital admission.  The patient also was found to have stones and sludge in her gallbladder but no dilation of the common bile duct.  Past Medical History:  Diagnosis Date   Arthritis    BMI 45.0-49.9, adult (HCC)    Chicken pox    GERD (gastroesophageal reflux disease)     Current Outpatient Medications  Medication Sig Dispense Refill   cetirizine (ZYRTEC) 10 MG tablet Take 10 mg by mouth daily as needed.     Multiple Vitamin (MULTIVITAMIN WITH MINERALS) TABS tablet Take 1 tablet by mouth daily.     trolamine salicylate (ASPERCREME) 10 % cream Apply 1 application. topically as needed for muscle pain.     No current facility-administered medications for this visit.    Allergies as of 04/28/2022 - Review Complete 04/28/2022  Allergen Reaction Noted   Methocarbamol Other (See Comments) 02/26/2017   Tramadol  01/07/2022    ROS:  General: Negative for anorexia, weight loss, fever, chills, fatigue, weakness. ENT: Negative for hoarseness, difficulty swallowing , nasal congestion. CV: Negative for chest pain, angina, palpitations, dyspnea on exertion, peripheral edema.  Respiratory: Negative for dyspnea at rest, dyspnea on exertion, cough, sputum, wheezing.  GI: See history of present illness. GU:  Negative for dysuria, hematuria, urinary incontinence, urinary frequency, nocturnal urination.  Endo: Negative for unusual weight change.    Physical Examination:   BP 106/71    Pulse (!) 101   Temp 98.1 F (36.7 C) (Oral)   Ht '5\' 7"'$  (1.702 m)   Wt 270 lb (122.5 kg)   LMP  (LMP Unknown) Comment: last in Feb  BMI 42.29 kg/m   General: Well-nourished, well-developed in no acute distress.  Eyes: No icterus. Conjunctivae pink. Lungs: Clear to auscultation bilaterally. Non-labored. Heart: Regular rate and rhythm, no murmurs rubs or gallops.  Abdomen: Bowel sounds are normal, nontender, nondistended, no hepatosplenomegaly or masses, no abdominal bruits or hernia , no rebound or guarding.   Extremities: No lower extremity edema. No clubbing or deformities. Neuro: Alert and oriented x 3.  Grossly intact. Skin: Warm and dry, no jaundice.   Psych: Alert and cooperative, normal mood and affect.  Labs:    Imaging Studies: US ABDOMEN LIMITED RUQ (LIVER/GB)  Result Date: 04/14/2022 CLINICAL DATA:  Transaminitis EXAM: ULTRASOUND ABDOMEN LIMITED RIGHT UPPER QUADRANT COMPARISON:  12/06/2021 abdominal CT FINDINGS: Gallbladder: Focal gallbladder tenderness, although no wall thickening or pericholecystic edema. There is dependent sludge and tiny shadowing calculi. Common bile duct: Diameter: 6 mm Liver: Mildly echogenic liver diffusely when compared to the kidney. No focal masslike finding. Portal vein is patent on color Doppler imaging with normal direction of blood flow towards the liver. IMPRESSION: 1. Gallbladder sludge and calculi. The gallbladder is tender but there is no wall thickening or fat edema to confirm acute cholecystitis. 2. Hepatic steatosis. Electronically Signed   By: Jorje Guild M.D.   On: 04/14/2022 06:08    Assessment and Plan:  Valerie Andrews is a 56 y.o. y/o female who comes in today with a history of abnormal liver enzymes in the hospital.  The patient may have had drug-induced liver disease versus idiopathic acute hepatitis.  The patient has been told to have a repeat liver enzymes today to make sure the liver enzymes are going down.  The patient will  be contacted with the results of liver enzymes when they are back.  The patient has been explained the plan and agrees with it.     [name], MD. Marval Regal    Note: This dictation was prepared with Dragon dictation along with smaller phrase technology. Any transcriptional errors that result from this process are unintentional.

## 2022-04-29 LAB — HEPATIC FUNCTION PANEL
ALT: 32 IU/L (ref 0–32)
AST: 20 IU/L (ref 0–40)
Albumin: 3.8 g/dL (ref 3.8–4.9)
Alkaline Phosphatase: 138 IU/L — ABNORMAL HIGH (ref 44–121)
Bilirubin Total: 0.9 mg/dL (ref 0.0–1.2)
Bilirubin, Direct: 0.55 mg/dL — ABNORMAL HIGH (ref 0.00–0.40)
Total Protein: 6.3 g/dL (ref 6.0–8.5)

## 2022-04-30 NOTE — Addendum Note (Signed)
Addended by: Lurlean Nanny on: 04/30/2022 08:59 AM   Modules accepted: Orders

## 2022-05-06 ENCOUNTER — Ambulatory Visit: Payer: 59

## 2022-05-06 ENCOUNTER — Inpatient Hospital Stay: Payer: 59

## 2022-05-06 ENCOUNTER — Inpatient Hospital Stay (HOSPITAL_BASED_OUTPATIENT_CLINIC_OR_DEPARTMENT_OTHER): Payer: 59 | Admitting: Obstetrics and Gynecology

## 2022-05-06 ENCOUNTER — Encounter: Payer: Self-pay | Admitting: Obstetrics and Gynecology

## 2022-05-06 ENCOUNTER — Other Ambulatory Visit: Payer: 59

## 2022-05-06 VITALS — BP 130/56 | HR 66 | Resp 18 | Ht 67.0 in | Wt 275.0 lb

## 2022-05-06 DIAGNOSIS — Z7189 Other specified counseling: Secondary | ICD-10-CM | POA: Diagnosis not present

## 2022-05-06 DIAGNOSIS — R911 Solitary pulmonary nodule: Secondary | ICD-10-CM

## 2022-05-06 DIAGNOSIS — N939 Abnormal uterine and vaginal bleeding, unspecified: Secondary | ICD-10-CM

## 2022-05-06 DIAGNOSIS — D3911 Neoplasm of uncertain behavior of right ovary: Secondary | ICD-10-CM | POA: Diagnosis not present

## 2022-05-06 DIAGNOSIS — D3910 Neoplasm of uncertain behavior of unspecified ovary: Secondary | ICD-10-CM

## 2022-05-06 NOTE — Patient Instructions (Signed)
We will arrange surgery for 06/03/22 with Dr's. Jackson/Secord. You will need clearance from your gastrointestinal physician prior to surgery. You will have a pre-admit appointment prior to surgery. You will be notified once scheduled.            DIVISION OF GYNECOLOGIC ONCOLOGY BOWEL PREP   The following instructions are extremely important to prepare for your surgery. Please follow them carefully   Step 1: Liquid Diet Instructions              Clear Liquid Diet for GYN Oncology Patients Day Before Surgery The day before your scheduled surgery DO NOT EAT any solid foods.  We do want you to drink enough liquids, but NO MILK products.  We do not want you to be dehydrated.  Clear liquids are defined as no milk products and no pieces of any solid food. Drink at least 64 oz. of fluid.  The following are all approved for you to drink the day before you surgery. Chicken, Beef or Vegetable Broth (bouillon or consomm) - NO BROTH AFTER MIDNIGHT Plain Jello  (no fruit) Water Strained lemonade or fruit punch Gatorade (any flavor) CLEAR Ensure or Boost Breeze Fruit juices without pulp, such as apple, grape, or cranberry juice Clear sodas - NO SODA AFTER MIDNIGHT Ice Pops without bits of fruit or fruit pulp Honey Tea or coffee without milk or cream                 Any foods not on the above list should be avoided                                                                                             Step 2: Laxatives           The evening before surgery:   Time: around 5pm   Follow these instructions carefully.   Administer 1 Dulcolax suppository according to manufacturer instructions on the box. You will need to purchase this laxative at a pharmacy or grocery store.    Individual responses to laxatives vary; this prep may cause multiple bowel movements. It often works in 30 minutes and may take as long as 3 hours. Stay near an available bathroom.    It is important to stay  hydrated. Ensure you are still drinking clear liquids.     IMPORTANT: FOR YOUR SAFETY, WE WILL HAVE TO CANCEL YOUR SURGERY IF YOU DO NOT FOLLOW THESE INSTRUCTIONS.   Do not eat anything after midnight (including gum or candy) prior to your surgery. Avoid drinking carbonated beverages after midnight. You can have clear liquids up until one hour before you arrive at the hospital. "Nothing by mouth" means no liquids, gum, candy, etc for one hour before your arrival time.      Laparoscopy Laparoscopy is a procedure to diagnose diseases in the abdomen. During the procedure, a thin, lighted, pencil-sized instrument called a laparoscope is inserted into the abdomen through an incision. The laparoscope allows your health care provider to look at the organs inside your body. LET [facility] CARE PROVIDER KNOW ABOUT: Any allergies you have. All medicines you  are taking, including vitamins, herbs, eye drops, creams, and over-the-counter medicines. Previous problems you or members of your family have had with the use of anesthetics. Any blood disorders you have. Previous surgeries you have had. Medical conditions you have. RISKS AND COMPLICATIONS  Generally, this is a safe procedure. However, problems can occur, which may include: Infection. Bleeding. Damage to other organs. Allergic reaction to the anesthetics used during the procedure. BEFORE THE PROCEDURE Do not eat or drink anything after midnight on the night before the procedure or as directed by your health care provider. Ask your health care provider about: Changing or stopping your regular medicines. Taking medicines such as aspirin and ibuprofen. These medicines can thin your blood. Do not take these medicines before your procedure if your health care provider instructs you not to. Plan to have someone take you home after the procedure. PROCEDURE You may be given a medicine to help you relax (sedative). You will be given a medicine to  make you sleep (general anesthetic). Your abdomen will be inflated with a gas. This will make your organs easier to see. Small incisions will be made in your abdomen. A laparoscope and other small instruments will be inserted into the abdomen through the incisions. A tissue sample may be removed from an organ in the abdomen for examination. The instruments will be removed from the abdomen. The gas will be released. The incisions will be closed with stitches (sutures). AFTER THE PROCEDURE  Your blood pressure, heart rate, breathing rate, and blood oxygen level will be monitored often until the medicines you were given have worn off.   This information is not intended to replace advice given to you by your health care provider. Make sure you discuss any questions you have with your health care provider.                                             Bowel Symptoms After Surgery After gynecologic surgery, women often have temporary changes in bowel function (constipation and gas pain).  Following are tips to help prevent and treat common bowel problems.  It also tells you when to call the doctor.  This is important because some symptoms might be a sign of a more serious bowel problem such as obstruction (bowel blockage).  These problems are rare but can happen after gynecologic surgery.   Besides surgery, what can temporarily affect bowel function? 1. Dietary changes   2. Decreased physical activity   3.Antibiotics   4. Pain medication   How can I prevent constipation (three days or more without a stool)? Include fiber in your diet: whole grains, raw or dried fruits & vegetables, prunes, prune/pear juiceDrink at least 8 glasses of liquid (preferably water) every day Avoid: Gas forming foods such as broccoli, beans, peas, salads, cabbage, sweet potatoes Greasy, fatty, or fried foods Activity helps bowel function return to normal, walk around the house at least 3-4 times each day for 15 minutes  or longer, if tolerated.  Rocking in a rocking chair is preferable to sitting still. Stool softeners: these are not laxatives, but serve to soften the stool to avoid straining.  Take 2-4 times a day until normal bowel function returns         Examples: Colace or generic equivalent (Docusate) Bulk laxatives: provide a concentrated source of fiber.  They do not stimulate the  bowel.  Take 1-2 times each day until normal bowel function return.              Examples: Citrucel, Metamucil, Fiberal, Fibercon   What can I take for "Gas Pains"? Simethicone (Mylicon, Gas-X, Maalox-Gas, Mylanta-Gas) take 3-4 times a day Maalox Regular - take 3-4 times a day Mylanta Regular - take 3-4 times a day   What can I take if I become constipated? Start with stool softeners and add additional laxatives below as needed to have a bowel movement every 1-2 days  Stool softeners 1-2 tablets, 2 times a day Senokot 1-2 tablets, 1-2 times a day Glycerin suppository can soften hard stool take once a day Bisacodyl suppository once a day  Milk of Magnesia 30 mL 1-2 times a day Fleets or tap water enema    What can I do for nausea?  Limit most solid foods for 24-48 hours Continue eating small frequent amounts of liquids and/or bland soft foods Toast, crackers, cooked cereal (grits, cream of wheat, rice) Benadryl: a mild anti-nausea medicine can be obtained without a prescription. May cause drowsiness, especially if taken with narcotic pain medicines Contact provider for prescription nausea medication     What can I do, or take for diarrhea (more than five loose stools per day)? Drink plenty of clear fluids to prevent dehydration May take Kaopectate, Pepto-Bismol, Imodium, or probiotics for 1-2 days Anusol or Preparation-H can be helpful for hemorrhoids and irritated tissue around anus   When should I call the doctor?             CONSTIPATION:  Not relieved after three days following the above  program VOMITING: That contains blood, "coffee ground" material More the three times/hour and unable to keep down nausea medication for more than eight hours With dry mouth, dark or strong urine, feeling light-headed, dizzy, or confused With severe abdominal pain or bloating for more than 24 hours DIARRHEA: That continues for more then 24-48 hours despite treatment That contains blood or tarry material With dry mouth, dark or strong urine, feeling light~headed, dizzy, or confused FEVER: 101 F or higher along with nausea, vomiting, gas pain, diarrhea UNABLE TO: Pass gas from rectum for more than 24 hours Tolerate liquids by mouth for more than 24 hours        Laparoscopic Hysterectomy, Care After Refer to this sheet in the next few weeks. These instructions provide you with information on caring for yourself after your procedure. Your health care provider may also give you more specific instructions. Your treatment has been planned according to current medical practices, but problems sometimes occur. Call your health care provider if you have any problems or questions after your procedure. What can I expect after the procedure? Pain and bruising at the incision sites. You will be given pain medicine to control it. Menopausal symptoms such as hot flashes, night sweats, and insomnia if your ovaries were removed. Sore throat from the breathing tube that was inserted during surgery. Follow these instructions at home: Only take over-the-counter or prescription medicines for pain, discomfort, or fever as directed by your health care provider. Do not take aspirin. It can cause bleeding. Do not drive when taking pain medicine. Follow your health care provider's advice regarding diet, exercise, lifting, driving, and general activities. Resume your usual diet as directed and allowed. Get plenty of rest and sleep. Do not douche, use tampons, or have sexual intercourse for at least 6 weeks, or  until your health care  provider gives you permission. Change your bandages (dressings) as directed by your health care provider. Monitor your temperature and notify your health care provider of a fever. Take showers instead of baths for 2-3 weeks. Do not drink alcohol until your health care provider gives you permission. If you develop constipation, you may take a mild laxative with your health care provider's permission. Bran foods may help with constipation problems. Drinking enough fluids to keep your urine clear or pale yellow may help as well. Try to have someone home with you for 1-2 weeks to help around the house. Keep all of your follow-up appointments as directed by your health care provider. Contact a health care provider if: You have swelling, redness, or increasing pain around your incision sites. You have pus coming from your incision. You notice a bad smell coming from your incision. Your incision breaks open. You feel dizzy or lightheaded. You have pain or bleeding when you urinate. You have persistent diarrhea. You have persistent nausea and vomiting. You have abnormal vaginal discharge. You have a rash. You have any type of abnormal reaction or develop an allergy to your medicine. You have poor pain control with your prescribed medicine. Get help right away if: You have chest pain or shortness of breath. You have severe abdominal pain that is not relieved with pain medicine. You have pain or swelling in your legs. This information is not intended to replace advice given to you by your health care provider. Make sure you discuss any questions you have with your health care provider. Document Released: 08/16/2013 Document Revised: 04/02/2016 Document Reviewed: 05/16/2013 Elsevier Interactive Patient Education  2017 [employer].

## 2022-05-06 NOTE — Progress Notes (Signed)
Pre and post operative teaching completed. Provided a  printed copy of teaching in AVS. All questions answered. Encouraged to call with further questions or needs.

## 2022-05-06 NOTE — Progress Notes (Signed)
Gynecologic Oncology Interval Visit   Referring Provider: Prentice Docker, MD  Chief Concern: postop visit  Subjective:  Valerie Andrews is a 56 y.o. [id number] who is seen in consultation from Dr. JGlennon Macfor RIGHT adnexal mixed density mass who presents for her postop visit.    On 03/04/2022 she underwent exam under anesthesia; Robotic-assisted right salpingo-oophorectomy; lysis of adhesions included enterolysis and ovariolysis, and washings. Intraop pathology concerning for lymphoma vs granulosa cell tumor. Final pathology granulosa cell tumor. Intraoperative findings included adhesions involving the omentum to the anterior pelvic wall, the appendix was adherent to the right IP ligament, the rectosigmoid was draped over the left adnexa, adherent to posterior low cervix and to the right ovary. The cyst did rupture intraoperative while trying to lyse the adhesions.      Pathology:  Tumor Site: Right ovary  Tumor Size: Greatest dimension 6 cm  Histologic Type: Granulosa cell tumor, adult type  Histologic Grade: Not applicable  Ovarian Surface Involvement: Cannot be determined: Specimen received fragmented  Fallopian Tube Surface Involvement: Not identified   Preop  FSH mIU/mL 3.1    Postop        Component Ref Range & Units 1 mo ago   FElginmIU/mL 33.8        Tumor Board recommendations: I recommended discussion of completion surgery and options.   She is now confirmed to be postmenopausal and after surgery she did have an episode of abnormal vaginal bleeding. We will obtain and EMBx today.   On 04/14/2022 she was admitted with RUQ pain, elevated LFTs and gallstone. History of abnormal liver enzymes in the hospital.  She has seen Dr. WAllen Norris GI, and had a complete work up. Final diagnosis for elevated LFTs include had drug-induced liver disease versus idiopathic acute hepatitis. Her last LFTs were on 04/28/22 and improved significantly.   Component Ref Range & Units 8 d  ago (04/28/22) 3 wk ago (04/15/22) 3 wk ago (04/14/22) 2 mo ago (02/12/22) 5 mo ago (12/06/21) 2 yr ago (02/08/20) 3 yr ago (08/25/18)  Total Protein 6.0 - 8.5 g/dL 6.3  6.0 Low  R  7.4 R  6.8 R  6.7 R  6.6 R  6.5 R   Albumin 3.8 - 4.9 g/dL 3.8  2.9 Low  R  3.5 R  3.4 Low  R  3.7 R  3.4 Low  R  3.7 R   Bilirubin Total 0.0 - 1.2 mg/dL 0.9  3.7 High  R, CM  9.3 High  R  1.0 R  0.6 R  1.0 R  0.7 R   Bilirubin, Direct 0.00 - 0.40 mg/dL 0.55 High          Alkaline Phosphatase 44 - 121 IU/L 138 High   136 High  R  176 High  R  73 R  83 R  65 R  65 R   AST 0 - 40 IU/L 20  130 High  R  285 High  R  15 R  18 R  16 R  10 R   ALT 0 - 32 IU/L 32  333 High  R  548 High  R  12 R  15 R  13 R  10 R   Resulting Agency            Gyn Oncology History She presented to the ER on 12/06/2021 with Pt c/o RLQ abd pain that radiates to her R side that has been intermittent for the past couple of days  but was worse.   12/06/2021 CT abdomen and pelvis     IMPRESSION: 1. A 6.7 cm RIGHT adnexal mixed density mass is nonspecific. This may reflect a hemorrhagic cyst, ovarian cystic neoplasm or sequela of RIGHT ovarian torsion. Recommend further dedicated evaluation with pelvic ultrasound versus pelvic MRI and consideration of gynecologic consultation. 2. Appendix is unremarkable. 3. 2 mm left solid pulmonary nodule. No routine follow-up imaging is recommended per Fleischner Society Guidelines. These guidelines do not apply to immunocompromised patients and patients with cancer. Follow up in patients with significant comorbidities as clinically warranted. For lung cancer screening, adhere to Lung-RADS guidelines. Reference: Radiology. 2017; 284(1):228-43.   12/06/2021 Pelvic US  Right ovary: Measurements: 5.5 x 4.5 x 4.1 cm = volume: 52 mL. There appears to be a predominantly solid but complex and multi septated mass within the right ovary. It appears to have blood flow. Left ovary Not visualized. IMPRESSION: Probable  predominantly solid but complex and multi septated mass within the right ovary measuring at least 5 cm. Further evaluation with MRI with and without gadolinium is recommended to evaluate for possible ovarian neoplasm.    Patient's ultrasound is most consistent with ovarian mass.  I spoke to Dr. Glennon Mac of OB/GYN who recommends obtaining a ROMA blood test to evaluate ovarian tumor markers.  He will follow-up in the office early this week for likely referral to gynecology oncology   ROMA:  If the patient is postmenopausal, then the postmenopausal ROMA score  of less than 2.99 is consistent with a low likelihood of finding  a malignancy on surgery.  Cancer Antigen (CA) 125 0.0 - 38.1 U/mL 13.3   Comment: (NOTE)  Roche [employer]ostics Electrochemiluminescence Immunoassay (ECLIA)  Values obtained with different assay methods or kits cannot be  used interchangeably.  Results cannot be interpreted as absolute  evidence of the presence or absence of malignant disease.   HE4 0.0 - 105.2 pmol/L 69.9    Premenopausal Interp: HIGH Comment   Comment: (NOTE)  If the patient is premenopausal, then the premenopausal ROMA score  of greater than or equal to 1.14 is consistent with a high  likelihood of finding a malignancy on surgery.      MRI pelvis 01/30/22 FINDINGS: Urinary Tract:  No abnormality visualized. Bowel:  Descending and sigmoid diverticulosis. Vascular/Lymphatic: No pathologically enlarged lymph nodes. No significant vascular abnormality seen.   Reproductive: Mixed solid cystic mass of the right ovary measuring 7.1 x 6.9 x 4.9 cm, with heterogeneous associated contrast enhancement (series 3, image 15). The left ovary is normal in size and appearance.    IMPRESSION: 1. Mixed solid cystic mass of the right ovary measuring 7.1 x 6.9 x 4.9 cm, highly concerning for primary ovarian malignancy. 2. Normal left ovary. 3. No evidence of pelvic lymphadenopathy or metastatic disease. 4. Descending  and sigmoid diverticulosis.  Surgery recommended.    Problem List: Patient Active Problem List   Diagnosis Date Noted   Right upper quadrant abdominal pain 04/14/2022   Abnormal LFTs 04/14/2022   Gallstone 04/14/2022   BMI 45.0-49.9, adult ([place])    Granulosa cell tumor 03/25/2022   Adnexal mass 02/04/2022   Esophageal reflux 04/25/2015   Arthritis of both knees 04/25/2015   OB History     Gravida  3   Para  1   Term      Preterm      AB  2   Living         SAB  2  IAB      Ectopic      Multiple      Live Births              Past Medical History: Past Medical History:  Diagnosis Date   Arthritis    BMI 45.0-49.9, adult ([facility])    Chicken pox    GERD (gastroesophageal reflux disease)     Past Surgical History: Past Surgical History:  Procedure Laterality Date   ROBOTIC ASSISTED SALPINGO OOPHERECTOMY Right 03/04/2022   Procedure: XI ROBOTIC ASSISTED SALPINGO OOPHORECTOMY, LYSIS OF ADHESIONS;  Surgeon: Gillis Ends, MD;  Location: [facility] ORS;  Service: Gynecology;  Laterality: Right;   SALPINGECTOMY Left    ectopic    Past Gynecologic History:  Menarche: 13 Menstrual details: Lasts 5 days Menses regular: yes Last Menstrual Period: Unknown History of Abnormal pap: no,  Last pap: NILM; HRHPV negative 01/07/2022 History of STDs: trichomonas 2017 Contraception: None   OB History:  OB History  Gravida Para Term Preterm AB Living  '3 1     2    '$ SAB IAB Ectopic Multiple Live Births  2            # Outcome Date GA Lbr Len/2nd Weight Sex Delivery Anes PTL Lv  3 SAB           2 SAB           1 Para             Family History: Family History  Problem Relation Age of Onset   Hypertension Mother    Stroke Father    Diabetes Paternal Grandmother    Cancer Neg Hx    Breast cancer Neg Hx     Social History: Social History   Socioeconomic History   Marital status: Single    Spouse name: Not on file   Number of children: Not on  file   Years of education: Not on file   Highest education level: Not on file  Occupational History   Not on file  Tobacco Use   Smoking status: Never   Smokeless tobacco: Never  Vaping Use   Vaping Use: Never used  Substance and Sexual Activity   Alcohol use: No    Alcohol/week: 0.0 standard drinks of alcohol   Drug use: No   Sexual activity: Not Currently    Birth control/protection: None  Other Topics Concern   Not on file  Social History Narrative   Not on file   Social Determinants of Health   Financial Resource Strain: Not on file  Food Insecurity: Not on file  Transportation Needs: Not on file  Physical Activity: Not on file  Stress: Not on file  Social Connections: Not on file  Intimate Partner Violence: Not on file    Allergies: Allergies  Allergen Reactions   Methocarbamol Other (See Comments)    headache   Tramadol     Other reaction(s): Other (See Comments) Increased pain and irritability     Current Medications: Current Outpatient Medications  Medication Sig Dispense Refill   cetirizine (ZYRTEC) 10 MG tablet Take 10 mg by mouth daily as needed.     Multiple Vitamin (MULTIVITAMIN WITH MINERALS) TABS tablet Take 1 tablet by mouth daily.     trolamine salicylate (ASPERCREME) 10 % cream Apply 1 application. topically as needed for muscle pain.     No current facility-administered medications for this visit.    Review of Systems General: fatigue and weakness  HEENT:  no complaints  Lungs: no complaints  Cardiac: no complaints  GI: no complaints  GU: no complaints  Musculoskeletal: no complaints  Extremities: no complaints  Skin: no complaints  Neuro: no complaints  Endocrine: no complaints  Psych: no complaints        Objective:  Physical Examination:  BP (!) 130/56 (BP Location: Left Arm, Patient Position: Sitting)   Pulse 66   Resp 18   Ht '5\' 7"'$  (1.702 m)   Wt 275 lb (124.7 kg)   LMP  (LMP Unknown) Comment: last in Feb  BMI 43.07  kg/m    GENERAL: Patient is a well appearing female in no acute distress HEENT:  PERRL, neck supple with midline trachea. Thyroid without masses.  NODES:  No cervical, supraclavicular, axillary, or inguinal lymphadenopathy palpated.  LUNGS:  Clear to auscultation bilaterally.  No wheezes or rhonchi. HEART:  Regular rate and rhythm.  ABDOMEN:  Soft, nontender, nondistended. No masses, hernias, or ascites.  MSK:  No focal spinal tenderness to palpation. Full range of motion bilaterally in the upper extremities. EXTREMITIES:  Positive 1+ bilateral peripheral edema.   SKIN:  incisions well healed NEURO:  Nonfocal. Well oriented.  Appropriate affect.  Pelvic: chaperoned by RN EGBUS: no lesions Cervix: no lesions, nontender, mobile Vagina: no lesions, no discharge or bleeding; long vaginal length required long speculum Uterus: not enlarged, nontender, mobile Adnexa: no palpable masses Rectovaginal: deferred  EMBx performed.   The risks and benefits of the procedure were reviewed and informed consent obtained. The pelvic exam was performed first. Time out was performed. The patient received pre-procedure teaching and expressed understanding. The post-procedure instructions were reviewed with the patient and she expressed understanding. The patient does not have any barriers to learning.  Speculum placed in the vaginal vault and cervix identified. Cervix cleansed with Betadine and anterior lip grasped with a tenaculum. The internal os was stenotic and would not allow the pipelle to advance. The os was dilated with a small dilator and the pipelle was inserted to 6 cm. The specimen was obtained without difficult. The tenaculum was removed and hemostasis was excellent.   Post-procedure evaluation the patient was stable without complaints.   Lab Review 12/06/2021 Lab Results  Component Value Date   WBC 6.1 04/14/2022   HGB 14.7 04/14/2022   HCT 45.3 04/14/2022   MCV 69.4 (L) 04/14/2022    PLT 163 04/14/2022     Chemistry      Component Value Date/Time   NA 137 04/15/2022 0444   K 4.0 04/15/2022 0444   CL 106 04/15/2022 0444   CO2 26 04/15/2022 0444   BUN 12 04/15/2022 0444   CREATININE 0.79 04/15/2022 0444      Component Value Date/Time   CALCIUM 8.5 (L) 04/15/2022 0444   ALKPHOS 138 (H) 04/28/2022 1555   AST 20 04/28/2022 1555   ALT 32 04/28/2022 1555   BILITOT 0.9 04/28/2022 1555        Radiologic Imaging: 12/06/2021 CT A/P TECHNIQUE: Multidetector CT imaging of the abdomen and pelvis was performed using the standard protocol following bolus administration of intravenous contrast.   RADIATION DOSE REDUCTION: This exam was performed according to the departmental dose-optimization program which includes automated exposure control, adjustment of the mA and/or kV according to patient size and/or use of iterative reconstruction technique.   CONTRAST:  170m OMNIPAQUE IOHEXOL 350 MG/ML SOLN   COMPARISON:  None.   FINDINGS: Lower chest: LEFT lower lobe pulmonary nodule measures 2 mm (series 4,  image 10.   Hepatobiliary: No focal liver abnormality is seen. No gallstones, gallbladder wall thickening, or biliary dilatation.   Pancreas: Unremarkable. No pancreatic ductal dilatation or surrounding inflammatory changes.   Spleen: There is a 13 mm round cystic density of the splenic hilum, nonspecific but likely a splenic cyst or hemangioma.   Adrenals/Urinary Tract: Adrenal glands are unremarkable. Kidneys enhance symmetrically. No hydronephrosis. No obstructing nephrolithiasis. Bladder is decompressed.   Stomach/Bowel: No evidence of bowel obstruction. Extensive diverticulosis throughout the colon without evidence of acute diverticulitis. Appendix is normal in appearance. Tip terminates near the region of the RIGHT adnexal mass. Small hiatal hernia. Duodenal diverticulum.   Vascular/Lymphatic: Aorta is normal in course and caliber. No suspicious  lymphadenopathy identified.   Reproductive: In the RIGHT adnexa, there is a 6.7 x 5.5 x 5.1 cm mixed density mass which contains a combination of low level cystic density and intermediate density. LEFT ovary is unremarkable. Uterus is unremarkable.   Other: No free air.   Musculoskeletal: No acute osseous abnormality.   IMPRESSION: 1. A 6.7 cm RIGHT adnexal mixed density mass is nonspecific. This may reflect a hemorrhagic cyst, ovarian cystic neoplasm or sequela of RIGHT ovarian torsion. Recommend further dedicated evaluation with pelvic ultrasound versus pelvic MRI and consideration of gynecologic consultation. 2. Appendix is unremarkable. 3. 2 mm left solid pulmonary nodule. No routine follow-up imaging is recommended per Fleischner Society Guidelines. These guidelines do not apply to immunocompromised patients and patients with cancer. Follow up in patients with significant comorbidities as clinically warranted. For lung cancer screening, adhere to Lung-RADS guidelines. Reference: Radiology. 2017; 284(1):228-43.       Assessment:  Valerie Andrews is a 56 y.o. female diagnosed with at least stage IC granulosa cell cancer.    Hot flashes, postmenopausal by [facility]  Episode of abnormal vaginal bleeding  History of left salpingectomy for ectopic pregnancy. On operative findings "over the left adnexa, adherent to posterior low cervix and to the right ovary"  Pulmonary nodule  Elevated LFTs, drug-induced liver disease versus idiopathic acute hepatitis    Medical co-morbidities complicating care: prior abdominal surgery. Morbid obesity. Body mass index is 43.07 kg/m.  Plan:   Problem List Items Addressed This Visit       Other   Granulosa cell tumor - Primary   Relevant Orders   Surgical pathology   DG Chest 2 View     We discussed options for management, and I recommended endometrial biopsy to assess for malignancy/EIN as this may change our surgical  recommendations; this was performed today. We will also need clearance from Dr. Allen Norris regarding LFTs/hepatitis and she is having repeat LFTs again.    I recommended completion surgery with robotic/laparoscopic removal of the uterus, biopsies, washings left salpingo-oophorectomy, and biopsies including removal of the omentum. If EMBx shows EIN or malignancy we have discussed possible need for SLN injection/mapping/biopsies. I also reviewed possible node dissection and possible laparotomy. She has known adhesive disease.     The risks of surgery were discussed in detail and she understands these to include infection; wound separation; delayed wound healing; injury to adjacent structures such as vagina, bladder, bowel, urethra, blood vessels, clitoris, anus and nerves; failure to remove the entire affected area; recurrence of dysplasia; bleeding which may require blood transfusion; anesthesia risk; thromboembolic events; possible death; unforeseen complications; possible need for further surgery; medical complications such as heart attack, stroke, and pneumonia; and, if staging performed the risk of lymphedema and lymphocyst.  Follow up CXR  for evaluation of pulmonary nodule.    The patient will receive DVT and antibiotic prophylaxis as indicated.    Risk point calculation: +2  age 13-74  VTE Risk assessment:  This patient has cancer, elevated tumor markers or personal/family hx VTE, and does not have disseminated cancer, or planned open or radical surgery.  The calculated risk score is 0-4.  High Risk:   One pre-op dose UFH, then continuous post-op dosing with LMWH or UFH while inpatient AND SCDs.   She voiced a clear understanding. She had the opportunity to ask questions and informed consent was obtained today.     The patient's diagnosis, an outline of the further diagnostic and laboratory studies which will be required, the recommendation, and alternatives were discussed.  All questions were  answered to the patient's satisfaction.  Heily Carlucci Gaetana Michaelis, MD      CC:  Will Bonnet,  MD [address] [place],  [place] [address] [phone]

## 2022-05-06 NOTE — H&P (View-Only) (Signed)
Gynecologic Oncology Interval Visit   Referring Provider: Prentice Docker, MD  Chief Concern: postop visit  Subjective:  Valerie Andrews is a 56 y.o. [id number] who is seen in consultation from Dr. JGlennon Macfor RIGHT adnexal mixed density mass who presents for her postop visit.    On 03/04/2022 she underwent exam under anesthesia; Robotic-assisted right salpingo-oophorectomy; lysis of adhesions included enterolysis and ovariolysis, and washings. Intraop pathology concerning for lymphoma vs granulosa cell tumor. Final pathology granulosa cell tumor. Intraoperative findings included adhesions involving the omentum to the anterior pelvic wall, the appendix was adherent to the right IP ligament, the rectosigmoid was draped over the left adnexa, adherent to posterior low cervix and to the right ovary. The cyst did rupture intraoperative while trying to lyse the adhesions.      Pathology:  Tumor Site: Right ovary  Tumor Size: Greatest dimension 6 cm  Histologic Type: Granulosa cell tumor, adult type  Histologic Grade: Not applicable  Ovarian Surface Involvement: Cannot be determined: Specimen received fragmented  Fallopian Tube Surface Involvement: Not identified   Preop  FSH mIU/mL 3.1    Postop        Component Ref Range & Units 1 mo ago   [place]IU/mL 33.8        Tumor Board recommendations: I recommended discussion of completion surgery and options.   She is now confirmed to be postmenopausal and after surgery she did have an episode of abnormal vaginal bleeding. We will obtain and EMBx today.   On 04/14/2022 she was admitted with RUQ pain, elevated LFTs and gallstone. History of abnormal liver enzymes in the hospital.  She has seen Dr. WAllen Norris GI, and had a complete work up. Final diagnosis for elevated LFTs include had drug-induced liver disease versus idiopathic acute hepatitis. Her last LFTs were on 04/28/22 and improved significantly.   Component Ref Range & Units 8 d  ago (04/28/22) 3 wk ago (04/15/22) 3 wk ago (04/14/22) 2 mo ago (02/12/22) 5 mo ago (12/06/21) 2 yr ago (02/08/20) 3 yr ago (08/25/18)  Total Protein 6.0 - 8.5 g/dL 6.3  6.0 Low  R  7.4 R  6.8 R  6.7 R  6.6 R  6.5 R   Albumin 3.8 - 4.9 g/dL 3.8  2.9 Low  R  3.5 R  3.4 Low  R  3.7 R  3.4 Low  R  3.7 R   Bilirubin Total 0.0 - 1.2 mg/dL 0.9  3.7 High  R, CM  9.3 High  R  1.0 R  0.6 R  1.0 R  0.7 R   Bilirubin, Direct 0.00 - 0.40 mg/dL 0.55 High          Alkaline Phosphatase 44 - 121 IU/L 138 High   136 High  R  176 High  R  73 R  83 R  65 R  65 R   AST 0 - 40 IU/L 20  130 High  R  285 High  R  15 R  18 R  16 R  10 R   ALT 0 - 32 IU/L 32  333 High  R  548 High  R  12 R  15 R  13 R  10 R   Resulting Agency            Gyn Oncology History She presented to the ER on 12/06/2021 with Pt c/o RLQ abd pain that radiates to her R side that has been intermittent for the past couple of days  but was worse.   12/06/2021 CT abdomen and pelvis     IMPRESSION: 1. A 6.7 cm RIGHT adnexal mixed density mass is nonspecific. This may reflect a hemorrhagic cyst, ovarian cystic neoplasm or sequela of RIGHT ovarian torsion. Recommend further dedicated evaluation with pelvic ultrasound versus pelvic MRI and consideration of gynecologic consultation. 2. Appendix is unremarkable. 3. 2 mm left solid pulmonary nodule. No routine follow-up imaging is recommended per Fleischner Society Guidelines. These guidelines do not apply to immunocompromised patients and patients with cancer. Follow up in patients with significant comorbidities as clinically warranted. For lung cancer screening, adhere to Lung-RADS guidelines. Reference: Radiology. 2017; 284(1):228-43.   12/06/2021 Pelvic US  Right ovary: Measurements: 5.5 x 4.5 x 4.1 cm = volume: 52 mL. There appears to be a predominantly solid but complex and multi septated mass within the right ovary. It appears to have blood flow. Left ovary Not visualized. IMPRESSION: Probable  predominantly solid but complex and multi septated mass within the right ovary measuring at least 5 cm. Further evaluation with MRI with and without gadolinium is recommended to evaluate for possible ovarian neoplasm.    Patient's ultrasound is most consistent with ovarian mass.  I spoke to Dr. Glennon Mac of OB/GYN who recommends obtaining a ROMA blood test to evaluate ovarian tumor markers.  He will follow-up in the office early this week for likely referral to gynecology oncology   ROMA:  If the patient is postmenopausal, then the postmenopausal ROMA score  of less than 2.99 is consistent with a low likelihood of finding  a malignancy on surgery.  Cancer Antigen (CA) 125 0.0 - 38.1 U/mL 13.3   Comment: (NOTE)  Roche [employer]ostics Electrochemiluminescence Immunoassay (ECLIA)  Values obtained with different assay methods or kits cannot be  used interchangeably.  Results cannot be interpreted as absolute  evidence of the presence or absence of malignant disease.   HE4 0.0 - 105.2 pmol/L 69.9    Premenopausal Interp: HIGH Comment   Comment: (NOTE)  If the patient is premenopausal, then the premenopausal ROMA score  of greater than or equal to 1.14 is consistent with a high  likelihood of finding a malignancy on surgery.      MRI pelvis 01/30/22 FINDINGS: Urinary Tract:  No abnormality visualized. Bowel:  Descending and sigmoid diverticulosis. Vascular/Lymphatic: No pathologically enlarged lymph nodes. No significant vascular abnormality seen.   Reproductive: Mixed solid cystic mass of the right ovary measuring 7.1 x 6.9 x 4.9 cm, with heterogeneous associated contrast enhancement (series 3, image 15). The left ovary is normal in size and appearance.    IMPRESSION: 1. Mixed solid cystic mass of the right ovary measuring 7.1 x 6.9 x 4.9 cm, highly concerning for primary ovarian malignancy. 2. Normal left ovary. 3. No evidence of pelvic lymphadenopathy or metastatic disease. 4. Descending  and sigmoid diverticulosis.  Surgery recommended.    Problem List: Patient Active Problem List   Diagnosis Date Noted   Right upper quadrant abdominal pain 04/14/2022   Abnormal LFTs 04/14/2022   Gallstone 04/14/2022   BMI 45.0-49.9, adult ([place])    Granulosa cell tumor 03/25/2022   Adnexal mass 02/04/2022   Esophageal reflux 04/25/2015   Arthritis of both knees 04/25/2015   OB History     Gravida  3   Para  1   Term      Preterm      AB  2   Living         SAB  2  IAB      Ectopic      Multiple      Live Births              Past Medical History: Past Medical History:  Diagnosis Date   Arthritis    BMI 45.0-49.9, adult ([facility])    Chicken pox    GERD (gastroesophageal reflux disease)     Past Surgical History: Past Surgical History:  Procedure Laterality Date   ROBOTIC ASSISTED SALPINGO OOPHERECTOMY Right 03/04/2022   Procedure: XI ROBOTIC ASSISTED SALPINGO OOPHORECTOMY, LYSIS OF ADHESIONS;  Surgeon: Gillis Ends, MD;  Location: [facility] ORS;  Service: Gynecology;  Laterality: Right;   SALPINGECTOMY Left    ectopic    Past Gynecologic History:  Menarche: 13 Menstrual details: Lasts 5 days Menses regular: yes Last Menstrual Period: Unknown History of Abnormal pap: no,  Last pap: NILM; HRHPV negative 01/07/2022 History of STDs: trichomonas 2017 Contraception: None   OB History:  OB History  Gravida Para Term Preterm AB Living  '3 1     2    '$ SAB IAB Ectopic Multiple Live Births  2            # Outcome Date GA Lbr Len/2nd Weight Sex Delivery Anes PTL Lv  3 SAB           2 SAB           1 Para             Family History: Family History  Problem Relation Age of Onset   Hypertension Mother    Stroke Father    Diabetes Paternal Grandmother    Cancer Neg Hx    Breast cancer Neg Hx     Social History: Social History   Socioeconomic History   Marital status: Single    Spouse name: Not on file   Number of children: Not on  file   Years of education: Not on file   Highest education level: Not on file  Occupational History   Not on file  Tobacco Use   Smoking status: Never   Smokeless tobacco: Never  Vaping Use   Vaping Use: Never used  Substance and Sexual Activity   Alcohol use: No    Alcohol/week: 0.0 standard drinks of alcohol   Drug use: No   Sexual activity: Not Currently    Birth control/protection: None  Other Topics Concern   Not on file  Social History Narrative   Not on file   Social Determinants of Health   Financial Resource Strain: Not on file  Food Insecurity: Not on file  Transportation Needs: Not on file  Physical Activity: Not on file  Stress: Not on file  Social Connections: Not on file  Intimate Partner Violence: Not on file    Allergies: Allergies  Allergen Reactions   Methocarbamol Other (See Comments)    headache   Tramadol     Other reaction(s): Other (See Comments) Increased pain and irritability     Current Medications: Current Outpatient Medications  Medication Sig Dispense Refill   cetirizine (ZYRTEC) 10 MG tablet Take 10 mg by mouth daily as needed.     Multiple Vitamin (MULTIVITAMIN WITH MINERALS) TABS tablet Take 1 tablet by mouth daily.     trolamine salicylate (ASPERCREME) 10 % cream Apply 1 application. topically as needed for muscle pain.     No current facility-administered medications for this visit.    Review of Systems General: fatigue and weakness  HEENT:  no complaints  Lungs: no complaints  Cardiac: no complaints  GI: no complaints  GU: no complaints  Musculoskeletal: no complaints  Extremities: no complaints  Skin: no complaints  Neuro: no complaints  Endocrine: no complaints  Psych: no complaints        Objective:  Physical Examination:  BP (!) 130/56 (BP Location: Left Arm, Patient Position: Sitting)   Pulse 66   Resp 18   Ht '5\' 7"'$  (1.702 m)   Wt 275 lb (124.7 kg)   LMP  (LMP Unknown) Comment: last in Feb  BMI 43.07  kg/m    GENERAL: Patient is a well appearing female in no acute distress HEENT:  PERRL, neck supple with midline trachea. Thyroid without masses.  NODES:  No cervical, supraclavicular, axillary, or inguinal lymphadenopathy palpated.  LUNGS:  Clear to auscultation bilaterally.  No wheezes or rhonchi. HEART:  Regular rate and rhythm.  ABDOMEN:  Soft, nontender, nondistended. No masses, hernias, or ascites.  MSK:  No focal spinal tenderness to palpation. Full range of motion bilaterally in the upper extremities. EXTREMITIES:  Positive 1+ bilateral peripheral edema.   SKIN:  incisions well healed NEURO:  Nonfocal. Well oriented.  Appropriate affect.  Pelvic: chaperoned by RN EGBUS: no lesions Cervix: no lesions, nontender, mobile Vagina: no lesions, no discharge or bleeding; long vaginal length required long speculum Uterus: not enlarged, nontender, mobile Adnexa: no palpable masses Rectovaginal: deferred  EMBx performed.   The risks and benefits of the procedure were reviewed and informed consent obtained. The pelvic exam was performed first. Time out was performed. The patient received pre-procedure teaching and expressed understanding. The post-procedure instructions were reviewed with the patient and she expressed understanding. The patient does not have any barriers to learning.  Speculum placed in the vaginal vault and cervix identified. Cervix cleansed with Betadine and anterior lip grasped with a tenaculum. The internal os was stenotic and would not allow the pipelle to advance. The os was dilated with a small dilator and the pipelle was inserted to 6 cm. The specimen was obtained without difficult. The tenaculum was removed and hemostasis was excellent.   Post-procedure evaluation the patient was stable without complaints.   Lab Review 12/06/2021 Lab Results  Component Value Date   WBC 6.1 04/14/2022   HGB 14.7 04/14/2022   HCT 45.3 04/14/2022   MCV 69.4 (L) 04/14/2022    PLT 163 04/14/2022     Chemistry      Component Value Date/Time   NA 137 04/15/2022 0444   K 4.0 04/15/2022 0444   CL 106 04/15/2022 0444   CO2 26 04/15/2022 0444   BUN 12 04/15/2022 0444   CREATININE 0.79 04/15/2022 0444      Component Value Date/Time   CALCIUM 8.5 (L) 04/15/2022 0444   ALKPHOS 138 (H) 04/28/2022 1555   AST 20 04/28/2022 1555   ALT 32 04/28/2022 1555   BILITOT 0.9 04/28/2022 1555        Radiologic Imaging: 12/06/2021 CT A/P TECHNIQUE: Multidetector CT imaging of the abdomen and pelvis was performed using the standard protocol following bolus administration of intravenous contrast.   RADIATION DOSE REDUCTION: This exam was performed according to the departmental dose-optimization program which includes automated exposure control, adjustment of the mA and/or kV according to patient size and/or use of iterative reconstruction technique.   CONTRAST:  128m OMNIPAQUE IOHEXOL 350 MG/ML SOLN   COMPARISON:  None.   FINDINGS: Lower chest: LEFT lower lobe pulmonary nodule measures 2 mm (series 4,  image 10.   Hepatobiliary: No focal liver abnormality is seen. No gallstones, gallbladder wall thickening, or biliary dilatation.   Pancreas: Unremarkable. No pancreatic ductal dilatation or surrounding inflammatory changes.   Spleen: There is a 13 mm round cystic density of the splenic hilum, nonspecific but likely a splenic cyst or hemangioma.   Adrenals/Urinary Tract: Adrenal glands are unremarkable. Kidneys enhance symmetrically. No hydronephrosis. No obstructing nephrolithiasis. Bladder is decompressed.   Stomach/Bowel: No evidence of bowel obstruction. Extensive diverticulosis throughout the colon without evidence of acute diverticulitis. Appendix is normal in appearance. Tip terminates near the region of the RIGHT adnexal mass. Small hiatal hernia. Duodenal diverticulum.   Vascular/Lymphatic: Aorta is normal in course and caliber. No suspicious  lymphadenopathy identified.   Reproductive: In the RIGHT adnexa, there is a 6.7 x 5.5 x 5.1 cm mixed density mass which contains a combination of low level cystic density and intermediate density. LEFT ovary is unremarkable. Uterus is unremarkable.   Other: No free air.   Musculoskeletal: No acute osseous abnormality.   IMPRESSION: 1. A 6.7 cm RIGHT adnexal mixed density mass is nonspecific. This may reflect a hemorrhagic cyst, ovarian cystic neoplasm or sequela of RIGHT ovarian torsion. Recommend further dedicated evaluation with pelvic ultrasound versus pelvic MRI and consideration of gynecologic consultation. 2. Appendix is unremarkable. 3. 2 mm left solid pulmonary nodule. No routine follow-up imaging is recommended per Fleischner Society Guidelines. These guidelines do not apply to immunocompromised patients and patients with cancer. Follow up in patients with significant comorbidities as clinically warranted. For lung cancer screening, adhere to Lung-RADS guidelines. Reference: Radiology. 2017; 284(1):228-43.       Assessment:  Valerie Andrews is a 56 y.o. female diagnosed with at least stage IC granulosa cell cancer.    Hot flashes, postmenopausal by [facility]  Episode of abnormal vaginal bleeding  History of left salpingectomy for ectopic pregnancy. On operative findings "over the left adnexa, adherent to posterior low cervix and to the right ovary"  Pulmonary nodule  Elevated LFTs, drug-induced liver disease versus idiopathic acute hepatitis    Medical co-morbidities complicating care: prior abdominal surgery. Morbid obesity. Body mass index is 43.07 kg/m.  Plan:   Problem List Items Addressed This Visit       Other   Granulosa cell tumor - Primary   Relevant Orders   Surgical pathology   DG Chest 2 View     We discussed options for management, and I recommended endometrial biopsy to assess for malignancy/EIN as this may change our surgical  recommendations; this was performed today. We will also need clearance from Dr. Allen Norris regarding LFTs/hepatitis and she is having repeat LFTs again.    I recommended completion surgery with robotic/laparoscopic removal of the uterus, biopsies, washings left salpingo-oophorectomy, and biopsies including removal of the omentum. If EMBx shows EIN or malignancy we have discussed possible need for SLN injection/mapping/biopsies. I also reviewed possible node dissection and possible laparotomy. She has known adhesive disease.     The risks of surgery were discussed in detail and she understands these to include infection; wound separation; delayed wound healing; injury to adjacent structures such as vagina, bladder, bowel, urethra, blood vessels, clitoris, anus and nerves; failure to remove the entire affected area; recurrence of dysplasia; bleeding which may require blood transfusion; anesthesia risk; thromboembolic events; possible death; unforeseen complications; possible need for further surgery; medical complications such as heart attack, stroke, and pneumonia; and, if staging performed the risk of lymphedema and lymphocyst.  Follow up CXR  for evaluation of pulmonary nodule.    The patient will receive DVT and antibiotic prophylaxis as indicated.    Risk point calculation: +2  age 4-74  VTE Risk assessment:  This patient has cancer, elevated tumor markers or personal/family hx VTE, and does not have disseminated cancer, or planned open or radical surgery.  The calculated risk score is 0-4.  High Risk:   One pre-op dose UFH, then continuous post-op dosing with LMWH or UFH while inpatient AND SCDs.   She voiced a clear understanding. She had the opportunity to ask questions and informed consent was obtained today.     The patient's diagnosis, an outline of the further diagnostic and laboratory studies which will be required, the recommendation, and alternatives were discussed.  All questions were  answered to the patient's satisfaction.  Rylin Seavey Gaetana Michaelis, MD      CC:  Will Bonnet,  MD [address] [place],  [place] [id number] [phone]

## 2022-05-06 NOTE — H&P (Signed)
Gynecologic Oncology Interval Visit   Referring Provider: Prentice Docker, MD  Chief Concern: postop visit  Subjective:  Valerie Andrews is a 56 y.o. [id number] who is seen in consultation from Dr. JGlennon Macfor RIGHT adnexal mixed density mass who presents for her postop visit.    On 03/04/2022 she underwent exam under anesthesia; Robotic-assisted right salpingo-oophorectomy; lysis of adhesions included enterolysis and ovariolysis, and washings. Intraop pathology concerning for lymphoma vs granulosa cell tumor. Final pathology granulosa cell tumor. Intraoperative findings included adhesions involving the omentum to the anterior pelvic wall, the appendix was adherent to the right IP ligament, the rectosigmoid was draped over the left adnexa, adherent to posterior low cervix and to the right ovary. The cyst did rupture intraoperative while trying to lyse the adhesions.      Pathology:  Tumor Site: Right ovary  Tumor Size: Greatest dimension 6 cm  Histologic Type: Granulosa cell tumor, adult type  Histologic Grade: Not applicable  Ovarian Surface Involvement: Cannot be determined: Specimen received fragmented  Fallopian Tube Surface Involvement: Not identified   Preop  FSH mIU/mL 3.1    Postop        Component Ref Range & Units 1 mo ago   FDilleymIU/mL 33.8        Tumor Board recommendations: I recommended discussion of completion surgery and options.   She is now confirmed to be postmenopausal and after surgery she did have an episode of abnormal vaginal bleeding. We will obtain and EMBx today.   On 04/14/2022 she was admitted with RUQ pain, elevated LFTs and gallstone. History of abnormal liver enzymes in the hospital.  She has seen Dr. WAllen Norris GI, and had a complete work up. Final diagnosis for elevated LFTs include had drug-induced liver disease versus idiopathic acute hepatitis. Her last LFTs were on 04/28/22 and improved significantly.   Component Ref Range & Units 8 d  ago (04/28/22) 3 wk ago (04/15/22) 3 wk ago (04/14/22) 2 mo ago (02/12/22) 5 mo ago (12/06/21) 2 yr ago (02/08/20) 3 yr ago (08/25/18)  Total Protein 6.0 - 8.5 g/dL 6.3  6.0 Low  R  7.4 R  6.8 R  6.7 R  6.6 R  6.5 R   Albumin 3.8 - 4.9 g/dL 3.8  2.9 Low  R  3.5 R  3.4 Low  R  3.7 R  3.4 Low  R  3.7 R   Bilirubin Total 0.0 - 1.2 mg/dL 0.9  3.7 High  R, CM  9.3 High  R  1.0 R  0.6 R  1.0 R  0.7 R   Bilirubin, Direct 0.00 - 0.40 mg/dL 0.55 High          Alkaline Phosphatase 44 - 121 IU/L 138 High   136 High  R  176 High  R  73 R  83 R  65 R  65 R   AST 0 - 40 IU/L 20  130 High  R  285 High  R  15 R  18 R  16 R  10 R   ALT 0 - 32 IU/L 32  333 High  R  548 High  R  12 R  15 R  13 R  10 R   Resulting Agency            Gyn Oncology History She presented to the ER on 12/06/2021 with Pt c/o RLQ abd pain that radiates to her R side that has been intermittent for the past couple of days  but was worse.   12/06/2021 CT abdomen and pelvis     IMPRESSION: 1. A 6.7 cm RIGHT adnexal mixed density mass is nonspecific. This may reflect a hemorrhagic cyst, ovarian cystic neoplasm or sequela of RIGHT ovarian torsion. Recommend further dedicated evaluation with pelvic ultrasound versus pelvic MRI and consideration of gynecologic consultation. 2. Appendix is unremarkable. 3. 2 mm left solid pulmonary nodule. No routine follow-up imaging is recommended per Fleischner Society Guidelines. These guidelines do not apply to immunocompromised patients and patients with cancer. Follow up in patients with significant comorbidities as clinically warranted. For lung cancer screening, adhere to Lung-RADS guidelines. Reference: Radiology. 2017; 284(1):228-43.   12/06/2021 Pelvic US  Right ovary: Measurements: 5.5 x 4.5 x 4.1 cm = volume: 52 mL. There appears to be a predominantly solid but complex and multi septated mass within the right ovary. It appears to have blood flow. Left ovary Not visualized. IMPRESSION: Probable  predominantly solid but complex and multi septated mass within the right ovary measuring at least 5 cm. Further evaluation with MRI with and without gadolinium is recommended to evaluate for possible ovarian neoplasm.    Patient's ultrasound is most consistent with ovarian mass.  I spoke to Dr. Glennon Mac of OB/GYN who recommends obtaining a ROMA blood test to evaluate ovarian tumor markers.  He will follow-up in the office early this week for likely referral to gynecology oncology   ROMA:  If the patient is postmenopausal, then the postmenopausal ROMA score  of less than 2.99 is consistent with a low likelihood of finding  a malignancy on surgery.  Cancer Antigen (CA) 125 0.0 - 38.1 U/mL 13.3   Comment: (NOTE)  Roche Diagnostics Electrochemiluminescence Immunoassay (ECLIA)  Values obtained with different assay methods or kits cannot be  used interchangeably.  Results cannot be interpreted as absolute  evidence of the presence or absence of malignant disease.   HE4 0.0 - 105.2 pmol/L 69.9    Premenopausal Interp: HIGH Comment   Comment: (NOTE)  If the patient is premenopausal, then the premenopausal ROMA score  of greater than or equal to 1.14 is consistent with a high  likelihood of finding a malignancy on surgery.      MRI pelvis 01/30/22 FINDINGS: Urinary Tract:  No abnormality visualized. Bowel:  Descending and sigmoid diverticulosis. Vascular/Lymphatic: No pathologically enlarged lymph nodes. No significant vascular abnormality seen.   Reproductive: Mixed solid cystic mass of the right ovary measuring 7.1 x 6.9 x 4.9 cm, with heterogeneous associated contrast enhancement (series 3, image 15). The left ovary is normal in size and appearance.    IMPRESSION: 1. Mixed solid cystic mass of the right ovary measuring 7.1 x 6.9 x 4.9 cm, highly concerning for primary ovarian malignancy. 2. Normal left ovary. 3. No evidence of pelvic lymphadenopathy or metastatic disease. 4. Descending  and sigmoid diverticulosis.  Surgery recommended.    Problem List: Patient Active Problem List   Diagnosis Date Noted   Right upper quadrant abdominal pain 04/14/2022   Abnormal LFTs 04/14/2022   Gallstone 04/14/2022   BMI 45.0-49.9, adult ([place])    Granulosa cell tumor 03/25/2022   Adnexal mass 02/04/2022   Esophageal reflux 04/25/2015   Arthritis of both knees 04/25/2015   OB History     Gravida  3   Para  1   Term      Preterm      AB  2   Living         SAB  2  IAB      Ectopic      Multiple      Live Births              Past Medical History: Past Medical History:  Diagnosis Date   Arthritis    BMI 45.0-49.9, adult ([facility])    Chicken pox    GERD (gastroesophageal reflux disease)     Past Surgical History: Past Surgical History:  Procedure Laterality Date   ROBOTIC ASSISTED SALPINGO OOPHERECTOMY Right 03/04/2022   Procedure: XI ROBOTIC ASSISTED SALPINGO OOPHORECTOMY, LYSIS OF ADHESIONS;  Surgeon: Gillis Ends, MD;  Location: [facility] ORS;  Service: Gynecology;  Laterality: Right;   SALPINGECTOMY Left    ectopic    Past Gynecologic History:  Menarche: 13 Menstrual details: Lasts 5 days Menses regular: yes Last Menstrual Period: Unknown History of Abnormal pap: no,  Last pap: NILM; HRHPV negative 01/07/2022 History of STDs: trichomonas 2017 Contraception: None   OB History:  OB History  Gravida Para Term Preterm AB Living  '3 1     2    '$ SAB IAB Ectopic Multiple Live Births  2            # Outcome Date GA Lbr Len/2nd Weight Sex Delivery Anes PTL Lv  3 SAB           2 SAB           1 Para             Family History: Family History  Problem Relation Age of Onset   Hypertension Mother    Stroke Father    Diabetes Paternal Grandmother    Cancer Neg Hx    Breast cancer Neg Hx     Social History: Social History   Socioeconomic History   Marital status: Single    Spouse name: Not on file   Number of children: Not on  file   Years of education: Not on file   Highest education level: Not on file  Occupational History   Not on file  Tobacco Use   Smoking status: Never   Smokeless tobacco: Never  Vaping Use   Vaping Use: Never used  Substance and Sexual Activity   Alcohol use: No    Alcohol/week: 0.0 standard drinks of alcohol   Drug use: No   Sexual activity: Not Currently    Birth control/protection: None  Other Topics Concern   Not on file  Social History Narrative   Not on file   Social Determinants of Health   Financial Resource Strain: Not on file  Food Insecurity: Not on file  Transportation Needs: Not on file  Physical Activity: Not on file  Stress: Not on file  Social Connections: Not on file  Intimate Partner Violence: Not on file    Allergies: Allergies  Allergen Reactions   Methocarbamol Other (See Comments)    headache   Tramadol     Other reaction(s): Other (See Comments) Increased pain and irritability     Current Medications: Current Outpatient Medications  Medication Sig Dispense Refill   cetirizine (ZYRTEC) 10 MG tablet Take 10 mg by mouth daily as needed.     Multiple Vitamin (MULTIVITAMIN WITH MINERALS) TABS tablet Take 1 tablet by mouth daily.     trolamine salicylate (ASPERCREME) 10 % cream Apply 1 application. topically as needed for muscle pain.     No current facility-administered medications for this visit.    Review of Systems General: fatigue and weakness  HEENT:  no complaints  Lungs: no complaints  Cardiac: no complaints  GI: no complaints  GU: no complaints  Musculoskeletal: no complaints  Extremities: no complaints  Skin: no complaints  Neuro: no complaints  Endocrine: no complaints  Psych: no complaints        Objective:  Physical Examination:  BP (!) 130/56 (BP Location: Left Arm, Patient Position: Sitting)   Pulse 66   Resp 18   Ht '5\' 7"'$  (1.702 m)   Wt 275 lb (124.7 kg)   LMP  (LMP Unknown) Comment: last in Feb  BMI 43.07  kg/m    GENERAL: Patient is a well appearing female in no acute distress HEENT:  PERRL, neck supple with midline trachea. Thyroid without masses.  NODES:  No cervical, supraclavicular, axillary, or inguinal lymphadenopathy palpated.  LUNGS:  Clear to auscultation bilaterally.  No wheezes or rhonchi. HEART:  Regular rate and rhythm.  ABDOMEN:  Soft, nontender, nondistended. No masses, hernias, or ascites.  MSK:  No focal spinal tenderness to palpation. Full range of motion bilaterally in the upper extremities. EXTREMITIES:  Positive 1+ bilateral peripheral edema.   SKIN:  incisions well healed NEURO:  Nonfocal. Well oriented.  Appropriate affect.  Pelvic: chaperoned by RN EGBUS: no lesions Cervix: no lesions, nontender, mobile Vagina: no lesions, no discharge or bleeding; long vaginal length required long speculum Uterus: not enlarged, nontender, mobile Adnexa: no palpable masses Rectovaginal: deferred  EMBx performed.   The risks and benefits of the procedure were reviewed and informed consent obtained. The pelvic exam was performed first. Time out was performed. The patient received pre-procedure teaching and expressed understanding. The post-procedure instructions were reviewed with the patient and she expressed understanding. The patient does not have any barriers to learning.  Speculum placed in the vaginal vault and cervix identified. Cervix cleansed with Betadine and anterior lip grasped with a tenaculum. The internal os was stenotic and would not allow the pipelle to advance. The os was dilated with a small dilator and the pipelle was inserted to 6 cm. The specimen was obtained without difficult. The tenaculum was removed and hemostasis was excellent.   Post-procedure evaluation the patient was stable without complaints.   Lab Review 12/06/2021 Lab Results  Component Value Date   WBC 6.1 04/14/2022   HGB 14.7 04/14/2022   HCT 45.3 04/14/2022   MCV 69.4 (L) 04/14/2022    PLT 163 04/14/2022     Chemistry      Component Value Date/Time   NA 137 04/15/2022 0444   K 4.0 04/15/2022 0444   CL 106 04/15/2022 0444   CO2 26 04/15/2022 0444   BUN 12 04/15/2022 0444   CREATININE 0.79 04/15/2022 0444      Component Value Date/Time   CALCIUM 8.5 (L) 04/15/2022 0444   ALKPHOS 138 (H) 04/28/2022 1555   AST 20 04/28/2022 1555   ALT 32 04/28/2022 1555   BILITOT 0.9 04/28/2022 1555        Radiologic Imaging: 12/06/2021 CT A/P TECHNIQUE: Multidetector CT imaging of the abdomen and pelvis was performed using the standard protocol following bolus administration of intravenous contrast.   RADIATION DOSE REDUCTION: This exam was performed according to the departmental dose-optimization program which includes automated exposure control, adjustment of the mA and/or kV according to patient size and/or use of iterative reconstruction technique.   CONTRAST:  13m OMNIPAQUE IOHEXOL 350 MG/ML SOLN   COMPARISON:  None.   FINDINGS: Lower chest: LEFT lower lobe pulmonary nodule measures 2 mm (series 4,  image 10.   Hepatobiliary: No focal liver abnormality is seen. No gallstones, gallbladder wall thickening, or biliary dilatation.   Pancreas: Unremarkable. No pancreatic ductal dilatation or surrounding inflammatory changes.   Spleen: There is a 13 mm round cystic density of the splenic hilum, nonspecific but likely a splenic cyst or hemangioma.   Adrenals/Urinary Tract: Adrenal glands are unremarkable. Kidneys enhance symmetrically. No hydronephrosis. No obstructing nephrolithiasis. Bladder is decompressed.   Stomach/Bowel: No evidence of bowel obstruction. Extensive diverticulosis throughout the colon without evidence of acute diverticulitis. Appendix is normal in appearance. Tip terminates near the region of the RIGHT adnexal mass. Small hiatal hernia. Duodenal diverticulum.   Vascular/Lymphatic: Aorta is normal in course and caliber. No suspicious  lymphadenopathy identified.   Reproductive: In the RIGHT adnexa, there is a 6.7 x 5.5 x 5.1 cm mixed density mass which contains a combination of low level cystic density and intermediate density. LEFT ovary is unremarkable. Uterus is unremarkable.   Other: No free air.   Musculoskeletal: No acute osseous abnormality.   IMPRESSION: 1. A 6.7 cm RIGHT adnexal mixed density mass is nonspecific. This may reflect a hemorrhagic cyst, ovarian cystic neoplasm or sequela of RIGHT ovarian torsion. Recommend further dedicated evaluation with pelvic ultrasound versus pelvic MRI and consideration of gynecologic consultation. 2. Appendix is unremarkable. 3. 2 mm left solid pulmonary nodule. No routine follow-up imaging is recommended per Fleischner Society Guidelines. These guidelines do not apply to immunocompromised patients and patients with cancer. Follow up in patients with significant comorbidities as clinically warranted. For lung cancer screening, adhere to Lung-RADS guidelines. Reference: Radiology. 2017; 284(1):228-43.       Assessment:  Valerie Andrews is a 56 y.o. female diagnosed with at least stage IC granulosa cell cancer.    Hot flashes, postmenopausal by [facility]  Episode of abnormal vaginal bleeding  History of left salpingectomy for ectopic pregnancy. On operative findings "over the left adnexa, adherent to posterior low cervix and to the right ovary"  Pulmonary nodule  Elevated LFTs, drug-induced liver disease versus idiopathic acute hepatitis    Medical co-morbidities complicating care: prior abdominal surgery. Morbid obesity. Body mass index is 43.07 kg/m.  Plan:   Problem List Items Addressed This Visit       Other   Granulosa cell tumor - Primary   Relevant Orders   Surgical pathology   DG Chest 2 View     We discussed options for management, and I recommended endometrial biopsy to assess for malignancy/EIN as this may change our surgical  recommendations; this was performed today. We will also need clearance from Dr. Allen Norris regarding LFTs/hepatitis and she is having repeat LFTs again.    I recommended completion surgery with robotic/laparoscopic removal of the uterus, biopsies, washings left salpingo-oophorectomy, and biopsies including removal of the omentum. If EMBx shows EIN or malignancy we have discussed possible need for SLN injection/mapping/biopsies. I also reviewed possible node dissection and possible laparotomy. She has known adhesive disease.     The risks of surgery were discussed in detail and she understands these to include infection; wound separation; delayed wound healing; injury to adjacent structures such as vagina, bladder, bowel, urethra, blood vessels, clitoris, anus and nerves; failure to remove the entire affected area; recurrence of dysplasia; bleeding which may require blood transfusion; anesthesia risk; thromboembolic events; possible death; unforeseen complications; possible need for further surgery; medical complications such as heart attack, stroke, and pneumonia; and, if staging performed the risk of lymphedema and lymphocyst.  Follow up CXR  for evaluation of pulmonary nodule.    The patient will receive DVT and antibiotic prophylaxis as indicated.    Risk point calculation: +2  age 67-74  VTE Risk assessment:  This patient has cancer, elevated tumor markers or personal/family hx VTE, and does not have disseminated cancer, or planned open or radical surgery.  The calculated risk score is 0-4.  High Risk:   One pre-op dose UFH, then continuous post-op dosing with LMWH or UFH while inpatient AND SCDs.   She voiced a clear understanding. She had the opportunity to ask questions and informed consent was obtained today.     The patient's diagnosis, an outline of the further diagnostic and laboratory studies which will be required, the recommendation, and alternatives were discussed.  All questions were  answered to the patient's satisfaction.  Luciann Gossett Gaetana Michaelis, MD      CC:  Will Bonnet,  MD [address] [place],  [place] [address] [phone]

## 2022-05-07 ENCOUNTER — Telehealth: Payer: Self-pay | Admitting: Gastroenterology

## 2022-05-07 NOTE — Telephone Encounter (Signed)
Patient left vm stating she was calling Melanie back. Requesting a call back.

## 2022-05-08 ENCOUNTER — Telehealth: Payer: Self-pay

## 2022-05-08 LAB — INHIBIN B: Inhibin B: 8.2 pg/mL

## 2022-05-08 LAB — SURGICAL PATHOLOGY

## 2022-05-08 NOTE — Telephone Encounter (Signed)
Pt is having total hysterectomy next month and GYN is requesting clearance from you for procedure     "We will arrange surgery for 06/03/22 with Dr's. Jackson/[place]. You will need clearance from your gastrointestinal physician prior to surgery."   Please advise in reference to clearance

## 2022-05-20 ENCOUNTER — Telehealth: Payer: Self-pay

## 2022-05-20 NOTE — Telephone Encounter (Signed)
error 

## 2022-05-21 ENCOUNTER — Telehealth: Payer: Self-pay

## 2022-05-21 NOTE — Telephone Encounter (Signed)
Left voicemail with Ms. Setter that we have received needed information from GI. [facility] still needs [facility].  [name], MD to Will Bonnet, MD      05/12/22  9:27 AM This patient has abnormal liver enzymes without any sign of synthetic dysfunction meaning that there is no sign of any indication of the liver isn't functioning Normally.  The patient can undergo surgery and is a low risk from a liver point of view.

## 2022-05-22 ENCOUNTER — Ambulatory Visit
Admission: RE | Admit: 2022-05-22 | Discharge: 2022-05-22 | Disposition: A | Discharge status: Home / Self Care | Payer: 59 | Attending: Obstetrics and Gynecology | Admitting: Obstetrics and Gynecology

## 2022-05-22 ENCOUNTER — Ambulatory Visit
Admission: RE | Admit: 2022-05-22 | Discharge: 2022-05-22 | Disposition: A | Discharge status: Home / Self Care | Payer: 59 | Source: Ambulatory Visit | Attending: Obstetrics and Gynecology | Admitting: Obstetrics and Gynecology

## 2022-05-22 DIAGNOSIS — D3910 Neoplasm of uncertain behavior of unspecified ovary: Secondary | ICD-10-CM | POA: Insufficient documentation

## 2022-05-22 DIAGNOSIS — Z01818 Encounter for other preprocedural examination: Secondary | ICD-10-CM | POA: Diagnosis not present

## 2022-05-26 ENCOUNTER — Encounter
Admission: RE | Admit: 2022-05-26 | Discharge: 2022-05-26 | Disposition: A | Discharge status: Home / Self Care | Payer: 59 | Source: Ambulatory Visit | Attending: Obstetrics and Gynecology | Admitting: Obstetrics and Gynecology

## 2022-05-26 ENCOUNTER — Other Ambulatory Visit: Payer: Self-pay

## 2022-05-26 HISTORY — DX: Nausea with vomiting, unspecified: Z98.890

## 2022-05-26 HISTORY — DX: Anemia, unspecified: D64.9

## 2022-05-26 HISTORY — DX: Other complications of anesthesia, initial encounter: T88.59XA

## 2022-05-26 NOTE — Patient Instructions (Addendum)
Your procedure is scheduled on: 06/03/22 - Wednesday Report to the Registration Desk on the 1st floor of the [facility]. To find out your arrival time, please call [phone] between 1PM - 3PM on: 06/02/22 - Tuesday If your arrival time is 6:00 am, do not arrive prior to that time as the [facility] entrance doors do not open until 6:00 am.  REMEMBER: Instructions that are not followed completely may result in serious medical risk, up to and including death; or upon the discretion of your surgeon and anesthesiologist your surgery may need to be rescheduled.  Do not eat food after midnight the night before surgery.  No gum chewing, lozengers or hard candies.  You may however, drink CLEAR liquids up to 2 hours before you are scheduled to arrive for your surgery. Do not drink anything within 2 hours of your scheduled arrival time.  Clear liquids include: - water  - apple juice without pulp - gatorade (not RED colors) - black coffee or tea (Do NOT add milk or creamers to the coffee or tea) Do NOT drink anything that is not on this list.  In addition, your doctor has ordered for you to drink the provided  Ensure Pre-Surgery Clear Carbohydrate Drink  Drinking this carbohydrate drink up to two hours before surgery helps to reduce insulin resistance and improve patient outcomes. Please complete drinking 2 hours prior to scheduled arrival time.  TAKE THESE MEDICATIONS THE MORNING OF SURGERY WITH A SIP OF WATER: NONE  One week prior to surgery: Stop Anti-inflammatories (NSAIDS) such as Advil, Aleve, Ibuprofen, Motrin, Naproxen, Naprosyn and Aspirin based products such as Excedrin, Goodys Powder, BC Powder.  Stop ANY OVER THE COUNTER supplements until after surgery.  You may take Tylenol if needed for pain up until the day of surgery.  No Alcohol for 24 hours before or after surgery.  No Smoking including e-cigarettes for 24 hours prior to surgery.  No chewable tobacco products for at  least 6 hours prior to surgery.  No nicotine patches on the day of surgery.  Do not use any "recreational" drugs for at least a week prior to your surgery.  Please be advised that the combination of cocaine and anesthesia may have negative outcomes, up to and including death. If you test positive for cocaine, your surgery will be cancelled.  On the morning of surgery brush your teeth with toothpaste and water, you may rinse your mouth with mouthwash if you wish. Do not swallow any toothpaste or mouthwash.  Use CHG Soap or wipes as directed on instruction sheet.  Do not wear jewelry, make-up, hairpins, clips or nail polish.  Do not wear lotions, powders, or perfumes.   Do not shave body from the neck down 48 hours prior to surgery just in case you cut yourself which could leave a site for infection.  Also, freshly shaved skin may become irritated if using the CHG soap.  Contact lenses, hearing aids and dentures may not be worn into surgery.  Do not bring valuables to the hospital. [facility] is not responsible for any missing/lost belongings or valuables.   Notify your doctor if there is any change in your medical condition (cold, fever, infection).  Wear comfortable clothing (specific to your surgery type) to the hospital.  After surgery, you can help prevent lung complications by doing breathing exercises.  Take deep breaths and cough every 1-2 hours. Your doctor may order a device called an Incentive Spirometer to help you take deep  breaths. When coughing or sneezing, hold a pillow firmly against your incision with both hands. This is called "splinting." Doing this helps protect your incision. It also decreases belly discomfort.  If you are being admitted to the hospital overnight, leave your suitcase in the car. After surgery it may be brought to your room.  If you are being discharged the day of surgery, you will not be allowed to drive home. You will need a responsible adult  (18 years or older) to drive you home and stay with you that night.   If you are taking public transportation, you will need to have a responsible adult (18 years or older) with you. Please confirm with your physician that it is acceptable to use public transportation.   Please call the [facility]. at [phone] if you have any questions about these instructions.  Surgery Visitation Policy:  Patients undergoing a surgery or procedure may have two family members or support persons with them as long as the person is not COVID-19 positive or experiencing its symptoms.   Inpatient Visitation:    Visiting hours are 7 a.m. to 8 p.m. Up to four visitors are allowed at one time in a patient room, including children. The visitors may rotate out with other people during the day. One designated support person (adult) may remain overnight.

## 2022-05-27 DIAGNOSIS — R748 Abnormal levels of other serum enzymes: Secondary | ICD-10-CM | POA: Diagnosis not present

## 2022-05-28 LAB — HEPATIC FUNCTION PANEL
ALT: 18 IU/L (ref 0–32)
AST: 15 IU/L (ref 0–40)
Albumin: 3.9 g/dL (ref 3.8–4.9)
Alkaline Phosphatase: 103 IU/L (ref 44–121)
Bilirubin Total: 0.5 mg/dL (ref 0.0–1.2)
Bilirubin, Direct: 0.2 mg/dL (ref 0.00–0.40)
Total Protein: 6.2 g/dL (ref 6.0–8.5)

## 2022-06-03 ENCOUNTER — Encounter: Payer: Self-pay | Admitting: Obstetrics and Gynecology

## 2022-06-03 ENCOUNTER — Encounter
Admission: RE | Disposition: A | Discharge status: Home / Self Care | Payer: Self-pay | Source: Home / Self Care | Attending: Obstetrics and Gynecology

## 2022-06-03 ENCOUNTER — Ambulatory Visit: Payer: 59 | Admitting: Urgent Care

## 2022-06-03 ENCOUNTER — Ambulatory Visit
Admission: RE | Admit: 2022-06-03 | Discharge: 2022-06-03 | Disposition: A | Discharge status: Home / Self Care | Payer: 59 | Attending: Obstetrics and Gynecology | Admitting: Obstetrics and Gynecology

## 2022-06-03 ENCOUNTER — Ambulatory Visit: Payer: 59 | Admitting: General Practice

## 2022-06-03 ENCOUNTER — Other Ambulatory Visit: Payer: Self-pay

## 2022-06-03 DIAGNOSIS — D3911 Neoplasm of uncertain behavior of right ovary: Secondary | ICD-10-CM

## 2022-06-03 DIAGNOSIS — N879 Dysplasia of cervix uteri, unspecified: Secondary | ICD-10-CM | POA: Insufficient documentation

## 2022-06-03 DIAGNOSIS — Z6841 Body Mass Index (BMI) 40.0 and over, adult: Secondary | ICD-10-CM | POA: Insufficient documentation

## 2022-06-03 DIAGNOSIS — C763 Malignant neoplasm of pelvis: Secondary | ICD-10-CM | POA: Insufficient documentation

## 2022-06-03 DIAGNOSIS — N8302 Follicular cyst of left ovary: Secondary | ICD-10-CM | POA: Insufficient documentation

## 2022-06-03 DIAGNOSIS — G4730 Sleep apnea, unspecified: Secondary | ICD-10-CM | POA: Diagnosis not present

## 2022-06-03 DIAGNOSIS — N72 Inflammatory disease of cervix uteri: Secondary | ICD-10-CM | POA: Insufficient documentation

## 2022-06-03 DIAGNOSIS — N736 Female pelvic peritoneal adhesions (postinfective): Secondary | ICD-10-CM | POA: Diagnosis not present

## 2022-06-03 DIAGNOSIS — D3910 Neoplasm of uncertain behavior of unspecified ovary: Secondary | ICD-10-CM

## 2022-06-03 HISTORY — PX: CYSTOSCOPY: SHX5120

## 2022-06-03 HISTORY — PX: ROBOTIC ASSISTED TOTAL HYSTERECTOMY WITH BILATERAL SALPINGO OOPHERECTOMY: SHX6086

## 2022-06-03 LAB — TYPE AND SCREEN
ABO/RH(D): B POS
Antibody Screen: NEGATIVE

## 2022-06-03 LAB — POCT PREGNANCY, URINE: Preg Test, Ur: NEGATIVE

## 2022-06-03 SURGERY — HYSTERECTOMY, TOTAL, ROBOT-ASSISTED, LAPAROSCOPIC, WITH BILATERAL SALPINGO-OOPHORECTOMY
Anesthesia: General | Site: Pelvis

## 2022-06-03 MED ORDER — DEXAMETHASONE SODIUM PHOSPHATE 10 MG/ML IJ SOLN
INTRAMUSCULAR | Status: DC | PRN
  Administered 2022-06-03: 10 mg via INTRAVENOUS

## 2022-06-03 MED ORDER — PHENYLEPHRINE 80 MCG/ML (10ML) SYRINGE FOR IV PUSH (FOR BLOOD PRESSURE SUPPORT)
PREFILLED_SYRINGE | INTRAVENOUS | Status: AC
  Filled 2022-06-03: qty 10

## 2022-06-03 MED ORDER — PHENYLEPHRINE HCL (PRESSORS) 10 MG/ML IV SOLN
INTRAVENOUS | Status: DC | PRN
  Administered 2022-06-03: 80 ug via INTRAVENOUS
  Administered 2022-06-03 (×2): 160 ug via INTRAVENOUS
  Administered 2022-06-03: 120 ug via INTRAVENOUS
  Administered 2022-06-03: 160 ug via INTRAVENOUS

## 2022-06-03 MED ORDER — ACETAMINOPHEN 10 MG/ML IV SOLN
INTRAVENOUS | Status: AC
  Filled 2022-06-03: qty 100

## 2022-06-03 MED ORDER — ONDANSETRON HCL 4 MG/2ML IJ SOLN
INTRAMUSCULAR | Status: DC | PRN
  Administered 2022-06-03: 4 mg via INTRAVENOUS

## 2022-06-03 MED ORDER — LIDOCAINE HCL (PF) 2 % IJ SOLN
INTRAMUSCULAR | Status: AC
  Filled 2022-06-03: qty 5

## 2022-06-03 MED ORDER — MIDAZOLAM HCL 2 MG/2ML IJ SOLN
INTRAMUSCULAR | Status: DC | PRN
  Administered 2022-06-03: 2 mg via INTRAVENOUS

## 2022-06-03 MED ORDER — SUGAMMADEX SODIUM 500 MG/5ML IV SOLN
INTRAVENOUS | Status: AC
Start: 2022-06-03 — End: ?
  Filled 2022-06-03: qty 5

## 2022-06-03 MED ORDER — FAMOTIDINE 20 MG PO TABS: 20.0000 mg | ORAL_TABLET | Freq: Once | ORAL | Status: AC

## 2022-06-03 MED ORDER — FENTANYL CITRATE (PF) 100 MCG/2ML IJ SOLN
INTRAMUSCULAR | Status: AC
  Filled 2022-06-03: qty 2

## 2022-06-03 MED ORDER — METHYLENE BLUE 1 % INJ SOLN
INTRAVENOUS | Status: AC
  Filled 2022-06-03: qty 10

## 2022-06-03 MED ORDER — BUPIVACAINE HCL (PF) 0.5 % IJ SOLN
INTRAMUSCULAR | Status: AC
  Filled 2022-06-03: qty 30

## 2022-06-03 MED ORDER — ONDANSETRON HCL 4 MG/2ML IJ SOLN
INTRAMUSCULAR | Status: AC
  Filled 2022-06-03: qty 2

## 2022-06-03 MED ORDER — PROPOFOL 10 MG/ML IV BOLUS
INTRAVENOUS | Status: AC
  Filled 2022-06-03: qty 20

## 2022-06-03 MED ORDER — OXYCODONE HCL 5 MG/5ML PO SOLN: 5.0000 mg | Freq: Once | ORAL | Status: AC | PRN

## 2022-06-03 MED ORDER — METRONIDAZOLE 500 MG/100ML IV SOLN
INTRAVENOUS | Status: DC | PRN
  Administered 2022-06-03: 500 mg via INTRAVENOUS

## 2022-06-03 MED ORDER — DEXMEDETOMIDINE HCL IN NACL 200 MCG/50ML IV SOLN
INTRAVENOUS | Status: DC | PRN
  Administered 2022-06-03: 4 ug via INTRAVENOUS
  Administered 2022-06-03 (×4): 8 ug via INTRAVENOUS

## 2022-06-03 MED ORDER — METHYLENE BLUE 1 % INJ SOLN
INTRAVENOUS | Status: DC | PRN
  Administered 2022-06-03: 10 mL

## 2022-06-03 MED ORDER — CEFAZOLIN IN SODIUM CHLORIDE 3-0.9 GM/100ML-% IV SOLN
3.0000 g | INTRAVENOUS | Status: AC
  Administered 2022-06-03: 3 g via INTRAVENOUS
  Filled 2022-06-03: qty 100

## 2022-06-03 MED ORDER — HEPARIN SODIUM (PORCINE) 5000 UNIT/ML IJ SOLN
INTRAMUSCULAR | Status: AC
  Filled 2022-06-03: qty 2

## 2022-06-03 MED ORDER — DEXAMETHASONE SODIUM PHOSPHATE 10 MG/ML IJ SOLN
INTRAMUSCULAR | Status: AC
  Filled 2022-06-03: qty 1

## 2022-06-03 MED ORDER — METRONIDAZOLE 500 MG/100ML IV SOLN
500.0000 mg | Freq: Once | INTRAVENOUS | Status: DC
  Filled 2022-06-03: qty 100

## 2022-06-03 MED ORDER — ACETAMINOPHEN 10 MG/ML IV SOLN
INTRAVENOUS | Status: DC | PRN
  Administered 2022-06-03: 1000 mg via INTRAVENOUS

## 2022-06-03 MED ORDER — HEPARIN SODIUM (PORCINE) 10000 UNIT/ML IJ SOLN
7500.0000 [IU] | INTRAMUSCULAR | Status: AC
  Administered 2022-06-03: 7500 [IU] via SUBCUTANEOUS
  Filled 2022-06-03: qty 1

## 2022-06-03 MED ORDER — DROPERIDOL 2.5 MG/ML IJ SOLN: 0.6250 mg | Freq: Once | INTRAMUSCULAR | Status: DC | PRN

## 2022-06-03 MED ORDER — BUPIVACAINE HCL (PF) 0.5 % IJ SOLN
INTRAMUSCULAR | Status: DC | PRN
  Administered 2022-06-03: 21 mL

## 2022-06-03 MED ORDER — GLYCOPYRROLATE 0.2 MG/ML IJ SOLN
INTRAMUSCULAR | Status: AC
  Filled 2022-06-03: qty 1

## 2022-06-03 MED ORDER — OXYCODONE-ACETAMINOPHEN 5-325 MG PO TABS: 1.0000 | ORAL_TABLET | Freq: Four times a day (QID) | ORAL | 0 refills | Status: AC | PRN

## 2022-06-03 MED ORDER — FAMOTIDINE 20 MG PO TABS
ORAL_TABLET | ORAL | Status: AC
  Administered 2022-06-03: 20 mg via ORAL
  Filled 2022-06-03: qty 1

## 2022-06-03 MED ORDER — LACTATED RINGERS IV SOLN: INTRAVENOUS | Status: DC

## 2022-06-03 MED ORDER — OXYCODONE HCL 5 MG PO TABS
5.0000 mg | ORAL_TABLET | Freq: Once | ORAL | Status: AC | PRN
  Administered 2022-06-03: 5 mg via ORAL

## 2022-06-03 MED ORDER — PROMETHAZINE HCL 25 MG/ML IJ SOLN: 6.2500 mg | INTRAMUSCULAR | Status: DC | PRN

## 2022-06-03 MED ORDER — CHLORHEXIDINE GLUCONATE 0.12 % MT SOLN
OROMUCOSAL | Status: AC
  Administered 2022-06-03: 15 mL via OROMUCOSAL
  Filled 2022-06-03: qty 15

## 2022-06-03 MED ORDER — SUGAMMADEX SODIUM 500 MG/5ML IV SOLN
INTRAVENOUS | Status: DC | PRN
  Administered 2022-06-03: 250 mg via INTRAVENOUS

## 2022-06-03 MED ORDER — CHLORHEXIDINE GLUCONATE 0.12 % MT SOLN: 15.0000 mL | Freq: Once | OROMUCOSAL | Status: AC

## 2022-06-03 MED ORDER — ORAL CARE MOUTH RINSE: 15.0000 mL | Freq: Once | OROMUCOSAL | Status: AC

## 2022-06-03 MED ORDER — GLYCOPYRROLATE 0.2 MG/ML IJ SOLN
INTRAMUSCULAR | Status: DC | PRN
  Administered 2022-06-03: .2 mg via INTRAVENOUS

## 2022-06-03 MED ORDER — OXYCODONE HCL 5 MG PO TABS
ORAL_TABLET | ORAL | Status: AC
  Filled 2022-06-03: qty 1

## 2022-06-03 MED ORDER — SODIUM CHLORIDE (PF) 0.9 % IJ SOLN
INTRAMUSCULAR | Status: AC
  Filled 2022-06-03: qty 50

## 2022-06-03 MED ORDER — FENTANYL CITRATE (PF) 100 MCG/2ML IJ SOLN: 25.0000 ug | INTRAMUSCULAR | Status: DC | PRN

## 2022-06-03 MED ORDER — SODIUM CHLORIDE 0.9 % IV SOLN
INTRAVENOUS | Status: AC | PRN
  Administered 2022-06-03: 1000 mL

## 2022-06-03 MED ORDER — LIDOCAINE HCL (CARDIAC) PF 100 MG/5ML IV SOSY
PREFILLED_SYRINGE | INTRAVENOUS | Status: DC | PRN
  Administered 2022-06-03: 100 mg via INTRAVENOUS

## 2022-06-03 MED ORDER — FENTANYL CITRATE (PF) 100 MCG/2ML IJ SOLN
INTRAMUSCULAR | Status: DC | PRN
  Administered 2022-06-03 (×3): 50 ug via INTRAVENOUS

## 2022-06-03 MED ORDER — MIDAZOLAM HCL 2 MG/2ML IJ SOLN
INTRAMUSCULAR | Status: AC
  Filled 2022-06-03: qty 2

## 2022-06-03 MED ORDER — ROCURONIUM BROMIDE 100 MG/10ML IV SOLN
INTRAVENOUS | Status: DC | PRN
  Administered 2022-06-03: 30 mg via INTRAVENOUS
  Administered 2022-06-03: 50 mg via INTRAVENOUS

## 2022-06-03 MED ORDER — ONDANSETRON 4 MG PO TBDP: 4.0000 mg | ORAL_TABLET | Freq: Four times a day (QID) | ORAL | 0 refills | Status: AC | PRN

## 2022-06-03 MED ORDER — POVIDONE-IODINE 10 % EX SWAB
2.0000 | Freq: Once | CUTANEOUS | Status: AC
  Administered 2022-06-03: 2 via TOPICAL

## 2022-06-03 MED ORDER — 0.9 % SODIUM CHLORIDE (POUR BTL) OPTIME
TOPICAL | Status: DC | PRN
  Administered 2022-06-03 (×2): 500 mL

## 2022-06-03 MED ORDER — PROPOFOL 10 MG/ML IV BOLUS
INTRAVENOUS | Status: DC | PRN
  Administered 2022-06-03: 150 mg via INTRAVENOUS

## 2022-06-03 MED ORDER — IBUPROFEN 600 MG PO TABS: 600.0000 mg | ORAL_TABLET | Freq: Four times a day (QID) | ORAL | 0 refills | Status: AC

## 2022-06-03 MED ORDER — ROCURONIUM BROMIDE 10 MG/ML (PF) SYRINGE
PREFILLED_SYRINGE | INTRAVENOUS | Status: AC
  Filled 2022-06-03: qty 10

## 2022-06-03 SURGICAL SUPPLY — 88 items
ADH SKN CLS APL DERMABOND .7 (GAUZE/BANDAGES/DRESSINGS) ×2
ANCHOR TIS RET SYS 235ML (MISCELLANEOUS) IMPLANT
BAG DRN RND TRDRP ANRFLXCHMBR (UROLOGICAL SUPPLIES) ×2
BAG LAPAROSCOPIC 12 15 PORT 16 (BASKET) IMPLANT
BAG RETRIEVAL 12/15 (BASKET)
BAG TISS RTRVL C235 10X14 (MISCELLANEOUS)
BAG URINE DRAIN 2000ML AR STRL (UROLOGICAL SUPPLIES) ×3 IMPLANT
BLADE SURG SZ11 CARB STEEL (BLADE) ×3 IMPLANT
CANNULA CAP OBTURATR AIRSEAL 8 (CAP) ×3 IMPLANT
CATH FOLEY 2WAY  5CC 16FR (CATHETERS) ×2
CATH FOLEY 2WAY 5CC 16FR (CATHETERS) ×2
CATH URTH 16FR FL 2W BLN LF (CATHETERS) ×2 IMPLANT
CNTNR SPEC 2.5X3XGRAD LEK (MISCELLANEOUS)
CONT SPEC 4OZ STER OR WHT (MISCELLANEOUS)
CONT SPEC 4OZ STRL OR WHT (MISCELLANEOUS)
CONTAINER SPEC 2.5X3XGRAD LEK (MISCELLANEOUS) IMPLANT
COUNTER NEEDLE 20/40 LG (NEEDLE) IMPLANT
COVER TIP SHEARS 8 DVNC (MISCELLANEOUS) ×2 IMPLANT
COVER TIP SHEARS 8MM DA VINCI (MISCELLANEOUS) ×3
COVER WAND RF STERILE (DRAPES) ×3 IMPLANT
DERMABOND ADVANCED (GAUZE/BANDAGES/DRESSINGS) ×1
DERMABOND ADVANCED .7 DNX12 (GAUZE/BANDAGES/DRESSINGS) ×2 IMPLANT
DRAPE 3/4 80X56 (DRAPES) ×3 IMPLANT
DRAPE ARM DVNC X/XI (DISPOSABLE) ×8 IMPLANT
DRAPE COLUMN DVNC XI (DISPOSABLE) ×2 IMPLANT
DRAPE DA VINCI XI ARM (DISPOSABLE) ×12
DRAPE DA VINCI XI COLUMN (DISPOSABLE) ×3
DRAPE ROBOT W/ LEGGING 30X125 (DRAPES) ×3 IMPLANT
DRESSING SURGICEL FIBRLLR 1X2 (HEMOSTASIS) IMPLANT
DRSG SURGICEL FIBRILLAR 1X2 (HEMOSTASIS)
ELECT BLADE 6 FLAT ULTRCLN (ELECTRODE) IMPLANT
ELECT REM PT RETURN 9FT ADLT (ELECTROSURGICAL) ×3
ELECTRODE REM PT RTRN 9FT ADLT (ELECTROSURGICAL) ×2 IMPLANT
GAUZE 4X4 16PLY ~~LOC~~+RFID DBL (SPONGE) ×6 IMPLANT
GLOVE BIO SURGEON STRL SZ 6.5 (GLOVE) ×18 IMPLANT
GLOVE BIOGEL PI IND STRL 6.5 (GLOVE) ×8 IMPLANT
GLOVE BIOGEL PI INDICATOR 6.5 (GLOVE) ×4
GOWN STRL REUS W/ TWL LRG LVL3 (GOWN DISPOSABLE) ×16 IMPLANT
GOWN STRL REUS W/TWL LRG LVL3 (GOWN DISPOSABLE) ×24
IRRIGATION STRYKERFLOW (MISCELLANEOUS) IMPLANT
IRRIGATOR STRYKERFLOW (MISCELLANEOUS) ×3
IV NS 1000ML (IV SOLUTION) ×3
IV NS 1000ML BAXH (IV SOLUTION) IMPLANT
KIT IMAGING PINPOINTPAQ (MISCELLANEOUS) IMPLANT
KIT PINK PAD W/HEAD ARE REST (MISCELLANEOUS) ×3
KIT PINK PAD W/HEAD ARM REST (MISCELLANEOUS) ×2 IMPLANT
LABEL OR SOLS (LABEL) ×3 IMPLANT
LIGASURE VESSEL 5MM BLUNT TIP (ELECTROSURGICAL) IMPLANT
MANIFOLD NEPTUNE II (INSTRUMENTS) ×3 IMPLANT
MANIPULATOR VCARE LG CRV RETR (MISCELLANEOUS) IMPLANT
MANIPULATOR VCARE SML CRV RETR (MISCELLANEOUS) IMPLANT
MANIPULATOR VCARE STD CRV RETR (MISCELLANEOUS) ×1 IMPLANT
NDL SPNL 22GX3.5 QUINCKE BK (NEEDLE) IMPLANT
NEEDLE HYPO 22GX1.5 SAFETY (NEEDLE) ×3 IMPLANT
NEEDLE SPNL 22GX3.5 QUINCKE BK (NEEDLE) IMPLANT
NEEDLE VERESS 14GA 120MM (NEEDLE) ×3 IMPLANT
NS IRRIG 1000ML POUR BTL (IV SOLUTION) ×3 IMPLANT
OBTURATOR OPTICAL STANDARD 8MM (TROCAR) ×2
OBTURATOR OPTICAL STND 8 DVNC (TROCAR) ×2
OBTURATOR OPTICALSTD 8 DVNC (TROCAR) IMPLANT
OCCLUDER COLPOPNEUMO (BALLOONS) ×3 IMPLANT
PACK GYN LAPAROSCOPIC (MISCELLANEOUS) ×3 IMPLANT
PAD PREP 24X41 OB/GYN DISP (PERSONAL CARE ITEMS) ×3 IMPLANT
SEAL CANN UNIV 5-8 DVNC XI (MISCELLANEOUS) ×6 IMPLANT
SEAL XI 5MM-8MM UNIVERSAL (MISCELLANEOUS) ×6
SEALER VESSEL DA VINCI XI (MISCELLANEOUS) ×2
SEALER VESSEL EXT DVNC XI (MISCELLANEOUS) IMPLANT
SET CYSTO W/LG BORE CLAMP LF (SET/KITS/TRAYS/PACK) IMPLANT
SET TUBE FILTERED XL AIRSEAL (SET/KITS/TRAYS/PACK) ×3 IMPLANT
SLEEVE ENDOPATH XCEL 5M (ENDOMECHANICALS) IMPLANT
SOLUTION ELECTROLUBE (MISCELLANEOUS) ×3 IMPLANT
SUT DVC VLOC 180 0 12IN GS21 (SUTURE)
SUT MNCRL 4-0 (SUTURE) ×6
SUT MNCRL 4-0 27XMFL (SUTURE) ×4
SUT VIC AB 0 CT1 36 (SUTURE) ×2 IMPLANT
SUT VICRYL 0 UR6 27IN ABS (SUTURE) ×2 IMPLANT
SUT VLOC 180 0 6IN GS21 (SUTURE) ×1 IMPLANT
SUTURE DVC VLC 180 0 12IN GS21 (SUTURE) IMPLANT
SUTURE MNCRL 4-0 27XMF (SUTURE) ×2 IMPLANT
SYR 10ML LL (SYRINGE) ×3 IMPLANT
SYR 3ML LL SCALE MARK (SYRINGE) IMPLANT
SYR 50ML LL SCALE MARK (SYRINGE) ×3 IMPLANT
SYR TOOMEY 50ML (SYRINGE) IMPLANT
TAPE TRANSPORE STRL 2 31045 (GAUZE/BANDAGES/DRESSINGS) ×3 IMPLANT
TRAP SPECIMEN MUCUS 40CC (MISCELLANEOUS) IMPLANT
TROCAR XCEL NON-BLD 11X100MML (ENDOMECHANICALS) IMPLANT
TROCAR XCEL NON-BLD 5MMX100MML (ENDOMECHANICALS) ×1 IMPLANT
WATER STERILE IRR 500ML POUR (IV SOLUTION) ×3 IMPLANT

## 2022-06-03 NOTE — Op Note (Signed)
06/03/2022   3:02 PM   PATIENT:  Valerie Andrews  56 y.o. female   PRE-OPERATIVE DIAGNOSIS:  Granulosa cell tumor   POST-OPERATIVE DIAGNOSIS:  Granulosa cell tumor, and pelvic adhesive disease.    PROCEDURE:  Procedure(s): XI ROBOTIC ASSISTED TOTAL HYSTERECTOMY WITH LEFT SALPINGO OOPHORECTOMY, BIOPSIES, PELVIC WASHING (Left) and cystoscopy.    SURGEON:  Surgeon(s) and Role:    * Jerrin Recore, Venida Jarvis, MD - Primary    * Will Bonnet, MD   ASSISTANT: Dr. Prentice Docker   ANESTHESIA:   general   EBL:  50 mL    BLOOD ADMINISTERED:none   DRAINS: none    LOCAL MEDICATIONS USED:  LIDOCAINE    SPECIMEN:  Source of Specimen:  washings, uterus, cervix, left ovary, left tube, multiple biopsies   DISPOSITION OF SPECIMEN:  PATHOLOGY   COUNTS:  YES   TOURNIQUET:  * No tourniquets in log *   DICTATION: .Note written in EPIC   PLAN OF CARE: Discharge to home after PACU   PATIENT DISPOSITION:  PACU - hemodynamically stable.   Delay start of Pharmacological VTE agent (>24hrs) due to surgical blood loss or risk of bleeding: no   DRAINS: Foley   TOTAL IV FLUIDS: at least 1100 cc   UOP: 800 cc   FINDINGS: Exam under anesthesia revealed a 8 cm mobile anteverted uterus. There were no adnexal masses or nodularity. The parametria was smooth. The cervix was negative for gross lesions. Intraoperative findings included extensive adhesion between the rectosigmoid, and posterior uterus. The bladder was adherent to the lower cervix in the midline. There was were several small 1 mm lesions on the right side of the bladder flap that were resected. The uterus was 8 cm and normal. The left adnexa was normal; and the right was surgically absent. There were some several small 1 mm lesions on the right IP remnant that were resected. The upper abdomen was normal including omentum, bowel, liver, stomach, and diaphragmatic surfaces. There was no evidence of grossly malignant pelvic or right  para-aortic lymph nodes. Cystoscopy was normal with flow from both ureters and no evidence of bladder injury.    PROCEDURE IN DETAIL: After informed consent was obtained, the patient was taken to the operating room where anesthesia was obtained without difficulty. The patient was positioned in the dorsal lithotomy position in [place] and her arms were carefully tucked at her sides and the usual precautions were taken.  She was prepped and draped in normal sterile fashion.  Time-out was performed and a Foley catheter was placed into the bladder. A standard VCare uterine manipulator was then placed in the uterus without incident.       Operative entry was obtained via a supraumbilical incision and Optiview entry by Dr. Glennon Mac. The abdomen insuffulated, and pelvis visualized with noted findings above. The Vcare was placed at this point. The robotic ports and LUQ port placement was performed by Dr. Glennon Mac, washings obtained, patient placed in trendelenbergy, and robotic docking was performed.    Adhesions involving the bowel, pelvis, uterus and left ovary were all lysed by Dr. Theora Gianotti. Adhesiolysis and enterolysis was greater than 45 minutes. Round ligaments were divided on each side with the EndoShears and the retroperitoneal space on the left was opened.  The left ureter was identified and preserved.  The left infundibulopelvic ligament was skeletonized, sealed and divided with the sealing device.  A bladder flap was created and the bladder was dissected down off the lower uterine segment  and cervix. However, the bladder flap was very adherent to the lower cervix. The bladder was filled with 200 cc to allow for better identification. Bladder flap was further dissected.  The uterine arteries were skeletonized bilaterally, sealed and divided with the sealing device.  Dr. Glennon Mac manipulated the uterus. A colpotomy was performed circumferentially along the V-Care ring with electrocautery and the cervix was  incised from the vagina and the specimen was removed through the vagina.  A pneumo balloon was placed in the vagina and the vaginal cuff was then closed by Dr. Glennon Mac in a running continuous fashion using the EndoStitch technique with 0 V-Lock suture with careful attention to include the vaginal cuff angles and the vaginal mucosa within the closure.  Hemostasis was observed. Dr. Glennon Mac performed the rest of the procedure. The intraperitoneal pressure was dropped, and all planes of dissection, vascular pedicles and the vaginal cuff were found to be hemostatic.  The trocars were removed under visualization.   Before the umbilical trocar was removed the CO2 gas was released.  The skin incision at the umbilicus was closed with subcuticular stitch.  The remaining skin incisions were closed with Indermil glue.     Cystoscopy was performed by Dr. Glennon Mac.  Flow from both ureters was seen and no evidence of bladder injury.    The patient tolerated the procedure well.  Sponge, lap and needle counts were correct x2.  The patient was taken to recovery room in excellent condition.   Antibiotics: Given 1st or 2nd generation cephalosporin, Antibiotics given within 1 hour of the start of the procedure, Antibiotics ordered to be discontinued within 24 hours post procedure     VTE prophylaxis: was ordered perioperatively.   Dr. Glennon Mac assisted me with the procedure which could not have been performed without her assistance. This was a high-level case requiring a Insurance risk surveyor. Dr. Glennon Mac performed the initial abdominal entry, placement of of robotic and assistant ports docking, and insertion of robotic instruments while I performed the vaginal part of the procedure. He also provided high level assistance throughout the procedure that was needed for resection of metastatic disease and uterine manipulation. He also closed the vaginal cuff incision, performed cystoscopy, removed all instruments undocked the  robotic, and closed all incisions.   Gillis Ends, MD

## 2022-06-03 NOTE — Interval H&P Note (Signed)
History and Physical Interval Note:  06/03/2022 11:59 AM  Valerie Andrews  has presented today for surgery, with the diagnosis of Granulosa cell tumor.  The various methods of treatment have been discussed with the patient and family. After consideration of risks, benefits and other options for treatment, the patient has consented to  Procedure(s): XI ROBOTIC ASSISTED TOTAL HYSTERECTOMY WITH LEFT SALPINGO OOPHORECTOMY, POSSIBLE SENTINEL LYMPH NODE INJECTION/MAPPING/BIOPSIES, POSSIBLE NODE DISSECTION, BIOPSIES, OMENTECTOMY, LAPAROTOMY (Left) as a surgical intervention.  The patient's history has been reviewed, patient examined, no change in status, stable for surgery.  I have reviewed the patient's chart and labs.  Questions were answered to the patient's satisfaction.    EMBX negative. No need to SLN. Left side marked.  [place]

## 2022-06-03 NOTE — Anesthesia Preprocedure Evaluation (Addendum)
Anesthesia Evaluation  Patient identified by MRN, date of birth, ID band Patient awake    Reviewed: Allergy & Precautions, H&P , NPO status , Patient's Chart, lab work & pertinent test results, reviewed documented beta blocker date and time   History of Anesthesia Complications Negative for: history of anesthetic complications  Airway Mallampati: III  TM Distance: >3 FB Neck ROM: full    Dental  (+) Teeth Intact, Poor Dentition   Pulmonary sleep apnea , neg COPD,    Pulmonary exam normal        Cardiovascular Exercise Tolerance: Good (-) hypertension(-) angina(-) CAD and (-) Past MI negative cardio ROS Normal cardiovascular exam(-) Valvular Problems/Murmurs Rhythm:regular Rate:Normal     Neuro/Psych negative neurological ROS  negative psych ROS   GI/Hepatic negative GI ROS, Neg liver ROS, GERD  Medicated,  Endo/Other  Morbid obesity  Renal/GU negative Renal ROS  negative genitourinary   Musculoskeletal   Abdominal   Peds  Hematology negative hematology ROS (+)   Anesthesia Other Findings Past Medical History: No date: Arthritis No date: BMI 45.0-49.9, adult (HCC) No date: Chicken pox No date: GERD (gastroesophageal reflux disease)  Past Surgical History: No date: SALPINGECTOMY; Left     Comment:  ectopic     Reproductive/Obstetrics negative OB ROS                             Anesthesia Physical  Anesthesia Plan  ASA: 3  Anesthesia Plan: General ETT and General   Post-op Pain Management:    Induction: Intravenous  PONV Risk Score and Plan: 4 or greater and Ondansetron, Dexamethasone, Midazolam and Treatment may vary due to age or medical condition  Airway Management Planned: Oral ETT  Additional Equipment:   Intra-op Plan:   Post-operative Plan: Extubation in OR  Informed Consent: I have reviewed the patients History and Physical, chart, labs and discussed the  procedure including the risks, benefits and alternatives for the proposed anesthesia with the patient or authorized representative who has indicated his/her understanding and acceptance.     Dental Advisory Given  Plan Discussed with: Anesthesiologist, CRNA and Surgeon  Anesthesia Plan Comments: (Patient consented for risks of anesthesia including but not limited to:  - adverse reactions to medications - damage to eyes, teeth, lips or other oral mucosa - nerve damage due to positioning  - sore throat or hoarseness - Damage to heart, brain, nerves, lungs, other parts of body or loss of life  Patient voiced understanding.)        Anesthesia Quick Evaluation

## 2022-06-03 NOTE — Brief Op Note (Signed)
06/03/2022  3:02 PM  PATIENT:  Valerie Andrews  56 y.o. female  PRE-OPERATIVE DIAGNOSIS:  Granulosa cell tumor  POST-OPERATIVE DIAGNOSIS:  Granulosa cell tumor, and pelvic adhesive disease.   PROCEDURE:  Procedure(s): XI ROBOTIC ASSISTED TOTAL HYSTERECTOMY WITH LEFT SALPINGO OOPHORECTOMY, BIOPSIES, PELVIC WASHING (Left) and cystoscopy.   SURGEON:  Surgeon(s) and Role:    * Tyleek Smick, Venida Jarvis, MD - Primary    * Will Bonnet, MD  ASSISTANT: Dr. Prentice Docker  ANESTHESIA:   general  EBL:  50 mL   BLOOD ADMINISTERED:none  DRAINS: none   LOCAL MEDICATIONS USED:  LIDOCAINE   SPECIMEN:  Source of Specimen:  washings, uterus, cervix, left ovary, left tube, multiple biopsies  DISPOSITION OF SPECIMEN:  PATHOLOGY  COUNTS:  YES  TOURNIQUET:  * No tourniquets in log *  DICTATION: .Note written in EPIC  PLAN OF CARE: Discharge to home after PACU  PATIENT DISPOSITION:  PACU - hemodynamically stable.   Delay start of Pharmacological VTE agent (>24hrs) due to surgical blood loss or risk of bleeding: no  DRAINS: Foley  TOTAL IV FLUIDS: at least 1100 cc  UOP: 800 cc  FINDINGS: Exam under anesthesia revealed a 8 cm mobile anteverted uterus. There were no adnexal masses or nodularity. The parametria was smooth. The cervix was negative for gross lesions. Intraoperative findings included extensive adhesion between the rectosigmoid, and posterior uterus. The bladder was adherent to the lower cervix in the midline. There was were several small 1 mm lesions on the right side of the bladder flap that were resected. The uterus was 8 cm and normal. The left adnexa was normal; and the right was surgically absent. There were some several small 1 mm lesions on the right IP remnant that were resected. The upper abdomen was normal including omentum, bowel, liver, stomach, and diaphragmatic surfaces. There was no evidence of grossly malignant pelvic or right para-aortic lymph nodes.  Cystoscopy was normal with flow from both ureters and no evidence of bladder injury.   PROCEDURE IN DETAIL: After informed consent was obtained, the patient was taken to the operating room where anesthesia was obtained without difficulty. The patient was positioned in the dorsal lithotomy position in [place] and her arms were carefully tucked at her sides and the usual precautions were taken.  She was prepped and draped in normal sterile fashion.  Time-out was performed and a Foley catheter was placed into the bladder. A standard VCare uterine manipulator was then placed in the uterus without incident.     Operative entry was obtained via a supraumbilical incision and Optiview entry by Dr. Glennon Mac. The abdomen insuffulated, and pelvis visualized with noted findings above. The Vcare was placed at this point. The robotic ports and LUQ port placement was performed by Dr. Glennon Mac, washings obtained, patient placed in trendelenbergy, and robotic docking was performed.   Adhesions involving the bowel, pelvis, uterus and left ovary were all lysed by Dr. Theora Gianotti. Adhesiolysis and enterolysis was greater than 45 minutes. Round ligaments were divided on each side with the EndoShears and the retroperitoneal space on the left was opened.  The left ureter was identified and preserved.  The left infundibulopelvic ligament was skeletonized, sealed and divided with the sealing device.  A bladder flap was created and the bladder was dissected down off the lower uterine segment and cervix. However, the bladder flap was very adherent to the lower cervix. The bladder was filled with 200 cc to allow for better identification. Bladder flap  was further dissected.  The uterine arteries were skeletonized bilaterally, sealed and divided with the sealing device.  Dr. Glennon Mac manipulated the uterus. A colpotomy was performed circumferentially along the V-Care ring with electrocautery and the cervix was incised from the vagina and  the specimen was removed through the vagina.  A pneumo balloon was placed in the vagina and the vaginal cuff was then closed by Dr. Glennon Mac in a running continuous fashion using the EndoStitch technique with 0 V-Lock suture with careful attention to include the vaginal cuff angles and the vaginal mucosa within the closure.  Hemostasis was observed. Dr. Glennon Mac performed the rest of the procedure. The intraperitoneal pressure was dropped, and all planes of dissection, vascular pedicles and the vaginal cuff were found to be hemostatic.  The trocars were removed under visualization.   Before the umbilical trocar was removed the CO2 gas was released.  The skin incision at the umbilicus was closed with subcuticular stitch.  The remaining skin incisions were closed with Indermil glue.    Cystoscopy was performed by Dr. Glennon Mac.  Flow from both ureters was seen and no evidence of bladder injury.   The patient tolerated the procedure well.  Sponge, lap and needle counts were correct x2.  The patient was taken to recovery room in excellent condition.  Antibiotics: Given 1st or 2nd generation cephalosporin, Antibiotics given within 1 hour of the start of the procedure, Antibiotics ordered to be discontinued within 24 hours post procedure   VTE prophylaxis: was ordered perioperatively.  Dr. Glennon Mac assisted me with the procedure which could not have been performed without her assistance. This was a high-level case requiring a Insurance risk surveyor. Dr. Glennon Mac performed the initial abdominal entry, placement of of robotic and assistant ports docking, and insertion of robotic instruments while I performed the vaginal part of the procedure. He also provided high level assistance throughout the procedure that was needed for resection of metastatic disease and uterine manipulation. He also closed the vaginal cuff incision, performed cystoscopy, removed all instruments undocked the robotic, and closed all  incisions.  Gillis Ends, MD

## 2022-06-03 NOTE — Transfer of Care (Signed)
Immediate Anesthesia Transfer of Care Note  Patient: Valerie Andrews  Procedure(s) Performed: XI ROBOTIC ASSISTED TOTAL HYSTERECTOMY WITH LEFT SALPINGO OOPHORECTOMY, BIOPSIES, PELVIC WASHING (Left: Pelvis) CYSTOSCOPY (Bladder)  Patient Location: PACU  Anesthesia Type:General  Level of Consciousness: drowsy and patient cooperative  Airway & Oxygen Therapy: Patient Spontanous Breathing and Patient connected to face mask oxygen  Post-op Assessment: Report given to RN and Post -op Vital signs reviewed and stable  Post vital signs: Reviewed and stable  Last Vitals:  Vitals Value Taken Time  BP 101/51 06/03/22 1532  Temp    Pulse 74 06/03/22 1532  Resp 13 06/03/22 1532  SpO2 100 % 06/03/22 1532  Vitals shown include unvalidated device data.  Last Pain:  Vitals:   06/03/22 0856  TempSrc: Temporal  PainSc: 0-No pain         Complications: No notable events documented.

## 2022-06-03 NOTE — Discharge Instructions (Signed)

## 2022-06-03 NOTE — Anesthesia Procedure Notes (Signed)
Procedure Name: Intubation Date/Time: 06/03/2022 12:09 PM  Performed by: Jonna Clark, CRNAPre-anesthesia Checklist: Patient identified, Patient being monitored, Timeout performed, Emergency Drugs available and Suction available Patient Re-evaluated:Patient Re-evaluated prior to induction Oxygen Delivery Method: Circle system utilized Preoxygenation: Pre-oxygenation with 100% oxygen Induction Type: IV induction Ventilation: Mask ventilation without difficulty Laryngoscope Size: 3 and McGraph Grade View: Grade I Tube type: Oral Tube size: 7.0 mm Number of attempts: 1 Airway Equipment and Method: Stylet Placement Confirmation: ETT inserted through vocal cords under direct vision, positive ETCO2 and breath sounds checked- equal and bilateral Secured at: 21 cm Tube secured with: Tape Dental Injury: Teeth and Oropharynx as per pre-operative assessment

## 2022-06-04 ENCOUNTER — Encounter: Payer: Self-pay | Admitting: Obstetrics and Gynecology

## 2022-06-04 NOTE — Anesthesia Postprocedure Evaluation (Signed)
Anesthesia Post Note  Patient: Valerie Andrews  Procedure(s) Performed: XI ROBOTIC ASSISTED TOTAL HYSTERECTOMY WITH LEFT SALPINGO OOPHORECTOMY, BIOPSIES, PELVIC WASHING (Left: Pelvis) CYSTOSCOPY (Bladder)  Patient location during evaluation: PACU Anesthesia Type: General Level of consciousness: awake and alert Pain management: pain level controlled Vital Signs Assessment: post-procedure vital signs reviewed and stable Respiratory status: spontaneous breathing, nonlabored ventilation and respiratory function stable Cardiovascular status: blood pressure returned to baseline and stable Postop Assessment: no apparent nausea or vomiting Anesthetic complications: no   No notable events documented.   Last Vitals:  Vitals:   06/03/22 1645 06/03/22 1708  BP:  121/73  Pulse: 64 60  Resp: 13 15  Temp: (!) 36.1 C (!) 36.1 C  SpO2: 100% 99%    Last Pain:  Vitals:   06/03/22 1708  TempSrc: Temporal  PainSc: 2                  [place] Ouch

## 2022-06-08 LAB — CYTOLOGY - NON PAP

## 2022-06-08 LAB — SURGICAL PATHOLOGY

## 2022-06-11 ENCOUNTER — Telehealth: Payer: Self-pay

## 2022-06-11 NOTE — Telephone Encounter (Signed)
[name] 56 year old female 30-Mar-1966 Comm Pref: Works at [employer] and [place] [address]   [phone] Lemmie Evens)     SHARLEEN SZCZESNY was contacted with the pathology results from surgery at [facility] on 06/03/2022. Final pathology revealed no evidence of disease. She is recovering well. Follow up scheduled 07/08/2022. She is still recovering from surgery and has LLQ pain at the incision site, otherwise she is doing well.  We will review her pathology again and discuss management at her follow up visit.   SURGICAL PATHOLOGY CASE: [id number] [phone] PATIENT: Valerie Andrews Surgical Pathology Report     Specimen Submitted: A. Abdominal wall lesion 1, left B. Abdominal wall lesion 2, left C. Tube adhesions, left D. Pelvic floor, left E. Bladder F. Pelvic floor, right G. Pelvic, right H. Pelvic, left upper I. Peri polyp, left J. Uterus, cervix, left fall tube and ovary  Clinical History: Granulosa cell tumor      DIAGNOSIS: A. ABDOMINAL WALL LESION 1, LEFT; BIOPSY: - MESOTHELIAL-LINED FIBROADIPOSE TISSUE WITH REACTIVE CHANGES. - NEGATIVE FOR MALIGNANCY.  B.  ABDOMINAL WALL LESION 2, LEFT; BIOPSY: - MESOTHELIAL-LINED FIBROADIPOSE TISSUE WITH CHANGES CONSISTENT WITH PRIOR SURGICAL PROCEDURE. - NEGATIVE FOR MALIGNANCY.  C.  FALLOPIAN TUBE ADHESIONS, LEFT; BIOPSY: - MESOTHELIAL-LINED FIBROADIPOSE TISSUE WITH REACTIVE CHANGES. - NEGATIVE FOR MALIGNANCY.  D.  PELVIC FLOOR, LEFT; BIOPSY: - MESOTHELIAL-LINED FIBROADIPOSE TISSUE WITH REACTIVE CHANGES. - NEGATIVE FOR MALIGNANCY.  E.  BLADDER; BIOPSY: - MESOTHELIAL-LINED FIBROADIPOSE TISSUE WITH REACTIVE CHANGES. - NEGATIVE FOR MALIGNANCY.  F.  PELVIC FLOOR, RIGHT; BIOPSY: - MESOTHELIAL-LINED FIBROADIPOSE TISSUE WITH REACTIVE CHANGES. - NEGATIVE FOR MALIGNANCY.  G.  PELVIC, RIGHT; BIOPSY: - MESOTHELIAL-LINED FIBROADIPOSE TISSUE WITH REACTIVE CHANGES. - NEGATIVE FOR  MALIGNANCY.  H.  PELVIC, LEFT UPPER; BIOPSY: - MESOTHELIAL-LINED FIBROADIPOSE TISSUE WITH REACTIVE CHANGES. - NEGATIVE FOR MALIGNANCY.  I.  PERI POLYP, LEFT; BIOPSY: - FIBROADIPOSE TISSUE WITH THERMAL ARTIFACT. - NEGATIVE FOR MALIGNANCY.  J.  UTERUS WITH CERVIX AND LEFT FALLOPIAN TUBE AND OVARY; TOTAL HYSTERECTOMY WITH UNILATERAL SALPINGO-OOPHORECTOMY: - CERVICITIS WITH SQUAMOUS METAPLASIA. - WEAKLY PROLIFERATIVE ENDOMETRIUM. - UNREMARKABLE MYOMETRIUM. - UTERINE FIBROUS SEROSAL ADHESIONS. - LEFT OVARY WITH FOLLICULAR CYST AND SEROSAL FIBROUS ADHESIONS. - LEFT FALLOPIAN TUBE WITH SEROSAL FIBROUS ADHESIONS. - NEGATIVE FOR MALIGNANCY.  ANGELES Gaetana Michaelis, MD

## 2022-06-23 ENCOUNTER — Telehealth: Payer: Self-pay

## 2022-06-23 NOTE — Telephone Encounter (Signed)
Second call placed to Valerie Andrews to reschedule 8/30 gyn oncology to 8/28. Left voicemail.

## 2022-07-06 ENCOUNTER — Inpatient Hospital Stay: Payer: 59 | Attending: Obstetrics and Gynecology | Admitting: Obstetrics and Gynecology

## 2022-07-06 VITALS — BP 145/85 | HR 96 | Temp 97.6°F | Ht 67.0 in | Wt 287.0 lb

## 2022-07-06 DIAGNOSIS — D3910 Neoplasm of uncertain behavior of unspecified ovary: Secondary | ICD-10-CM

## 2022-07-06 DIAGNOSIS — D3911 Neoplasm of uncertain behavior of right ovary: Secondary | ICD-10-CM

## 2022-07-06 DIAGNOSIS — Z90710 Acquired absence of both cervix and uterus: Secondary | ICD-10-CM | POA: Diagnosis not present

## 2022-07-06 DIAGNOSIS — Z90722 Acquired absence of ovaries, bilateral: Secondary | ICD-10-CM | POA: Diagnosis not present

## 2022-07-06 DIAGNOSIS — Z7189 Other specified counseling: Secondary | ICD-10-CM

## 2022-07-06 DIAGNOSIS — D7389 Other diseases of spleen: Secondary | ICD-10-CM

## 2022-07-06 DIAGNOSIS — R911 Solitary pulmonary nodule: Secondary | ICD-10-CM

## 2022-07-06 NOTE — Progress Notes (Signed)
Gynecologic Oncology Interval Visit   Referring Provider: Prentice Docker, MD  Chief Concern: postop visit  Subjective:  Valerie Andrews is a 56 y.o. [id number] who is seen in consultation from Dr. JGlennon Macfor RIGHT adnexal mixed density mass who presents for her postop visit.    05/25/2022 Follow up CXR for evaluation of pulmonary nodule, negative for concerning findings IMPRESSION: No active cardiopulmonary disease.  06/03/2022 XI ROBOTIC ASSISTED TOTAL HYSTERECTOMY WITH LEFT SALPINGO OOPHORECTOMY, BIOPSIES, PELVIC WASHING and cystoscopy.  DIAGNOSIS: A. ABDOMINAL WALL LESION 1, LEFT; BIOPSY: - MESOTHELIAL-LINED FIBROADIPOSE TISSUE WITH REACTIVE CHANGES. - NEGATIVE FOR MALIGNANCY.  B.  ABDOMINAL WALL LESION 2, LEFT; BIOPSY: - MESOTHELIAL-LINED FIBROADIPOSE TISSUE WITH CHANGES CONSISTENT WITH PRIOR SURGICAL PROCEDURE. - NEGATIVE FOR MALIGNANCY.  C.  FALLOPIAN TUBE ADHESIONS, LEFT; BIOPSY: - MESOTHELIAL-LINED FIBROADIPOSE TISSUE WITH REACTIVE CHANGES. - NEGATIVE FOR MALIGNANCY.  D.  PELVIC FLOOR, LEFT; BIOPSY: - MESOTHELIAL-LINED FIBROADIPOSE TISSUE WITH REACTIVE CHANGES. - NEGATIVE FOR MALIGNANCY.  E.  BLADDER; BIOPSY: - MESOTHELIAL-LINED FIBROADIPOSE TISSUE WITH REACTIVE CHANGES. - NEGATIVE FOR MALIGNANCY.  F.  PELVIC FLOOR, RIGHT; BIOPSY: - MESOTHELIAL-LINED FIBROADIPOSE TISSUE WITH REACTIVE CHANGES. - NEGATIVE FOR MALIGNANCY.  G.  PELVIC, RIGHT; BIOPSY: - MESOTHELIAL-LINED FIBROADIPOSE TISSUE WITH REACTIVE CHANGES. - NEGATIVE FOR MALIGNANCY.  H.  PELVIC, LEFT UPPER; BIOPSY: - MESOTHELIAL-LINED FIBROADIPOSE TISSUE WITH REACTIVE CHANGES. - NEGATIVE FOR MALIGNANCY.  I.  PERI POLYP, LEFT; BIOPSY: - FIBROADIPOSE TISSUE WITH THERMAL ARTIFACT. - NEGATIVE FOR MALIGNANCY.  J.  UTERUS WITH CERVIX AND LEFT FALLOPIAN TUBE AND OVARY; TOTAL HYSTERECTOMY WITH UNILATERAL SALPINGO-OOPHORECTOMY: - CERVICITIS WITH SQUAMOUS METAPLASIA. - WEAKLY PROLIFERATIVE ENDOMETRIUM. -  UNREMARKABLE MYOMETRIUM. - UTERINE FIBROUS SEROSAL ADHESIONS. - LEFT OVARY WITH FOLLICULAR CYST AND SEROSAL FIBROUS ADHESIONS. - LEFT FALLOPIAN TUBE WITH SEROSAL FIBROUS ADHESIONS. - NEGATIVE FOR MALIGNANCY.  DIAGNOSIS:  A. PELVIC WASHING:  - POSITIVE FOR MALIGNANCY.  - INVOLVEMENT BY PATIENT'S KNOWN GRANULOSA CELL TUMOR.  Gyn Oncology History She presented to the ER on 12/06/2021 with Pt c/o RLQ abd pain that radiates to her R side that has been intermittent for the past couple of days but was worse.   12/06/2021 CT abdomen and pelvis     IMPRESSION: 1. A 6.7 cm RIGHT adnexal mixed density mass is nonspecific. This may reflect a hemorrhagic cyst, ovarian cystic neoplasm or sequela of RIGHT ovarian torsion. Recommend further dedicated evaluation with pelvic ultrasound versus pelvic MRI and consideration of gynecologic consultation. 2. Appendix is unremarkable. 3. 2 mm left solid pulmonary nodule. No routine follow-up imaging is recommended per Fleischner Society Guidelines. These guidelines do not apply to immunocompromised patients and patients with cancer. Follow up in patients with significant comorbidities as clinically warranted. For lung cancer screening, adhere to Lung-RADS guidelines. Reference: Radiology. 2017; 284(1):228-43.   12/06/2021 Pelvic [place] Right ovary: Measurements: 5.5 x 4.5 x 4.1 cm = volume: 52 mL. There appears to be a predominantly solid but complex and multi septated mass within the right ovary. It appears to have blood flow. Left ovary Not visualized. IMPRESSION: Probable predominantly solid but complex and multi septated mass within the right ovary measuring at least 5 cm. Further evaluation with MRI with and without gadolinium is recommended to evaluate for possible ovarian neoplasm.    Patient's ultrasound is most consistent with ovarian mass.  I spoke to Dr. JGlennon Macof OB/GYN who recommends obtaining a ROMA blood test to evaluate ovarian tumor markers.  He  will follow-up in the office early this week for likely referral to gynecology  oncology   ROMA:  If the patient is postmenopausal, then the postmenopausal ROMA score  of less than 2.99 is consistent with a low likelihood of finding  a malignancy on surgery.  Cancer Antigen (CA) 125 0.0 - 38.1 U/mL 13.3   Comment: (NOTE)  Roche [employer]ostics Electrochemiluminescence Immunoassay (ECLIA)  Values obtained with different assay methods or kits cannot be  used interchangeably.  Results cannot be interpreted as absolute  evidence of the presence or absence of malignant disease.   HE4 0.0 - 105.2 pmol/L 69.9    Premenopausal Interp: HIGH Comment   Comment: (NOTE)  If the patient is premenopausal, then the premenopausal ROMA score  of greater than or equal to 1.14 is consistent with a high  likelihood of finding a malignancy on surgery.      MRI pelvis 01/30/22 FINDINGS: Urinary Tract:  No abnormality visualized. Bowel:  Descending and sigmoid diverticulosis. Vascular/Lymphatic: No pathologically enlarged lymph nodes. No significant vascular abnormality seen.   Reproductive: Mixed solid cystic mass of the right ovary measuring 7.1 x 6.9 x 4.9 cm, with heterogeneous associated contrast enhancement (series 3, image 15). The left ovary is normal in size and appearance.    IMPRESSION: 1. Mixed solid cystic mass of the right ovary measuring 7.1 x 6.9 x 4.9 cm, highly concerning for primary ovarian malignancy. 2. Normal left ovary. 3. No evidence of pelvic lymphadenopathy or metastatic disease. 4. Descending and sigmoid diverticulosis.  Surgery recommended.   03/04/2022 she underwent exam under anesthesia; Robotic-assisted right salpingo-oophorectomy; lysis of adhesions included enterolysis and ovariolysis, and washings. Intraop pathology concerning for lymphoma vs granulosa cell tumor. Final pathology granulosa cell tumor. Intraoperative findings included adhesions involving the omentum to the  anterior pelvic wall, the appendix was adherent to the right IP ligament, the rectosigmoid was draped over the left adnexa, adherent to posterior low cervix and to the right ovary. The cyst did rupture intraoperative while trying to lyse the adhesions.      Pathology:  Tumor Site: Right ovary  Tumor Size: Greatest dimension 6 cm  Histologic Type: Granulosa cell tumor, adult type  Histologic Grade: Not applicable  Ovarian Surface Involvement: Cannot be determined: Specimen received fragmented  Fallopian Tube Surface Involvement: Not identified   Preop  FSH mIU/mL 3.1    Postop        Component Ref Range & Units 1 mo ago   FSH mIU/mL 33.8        05/06/2022 Preop EMBx A. ENDOMETRIUM; BIOPSY: SUPERFICIAL FRAGMENTS OF INACTIVE APPEARING ENDOMETRIUM WITH ASSOCIATED BREAKDOWN. NEGATIVE FOR ATYPICAL HYPERPLASIA/EIN AND MALIGNANCY.   Tumor Board recommendations: I recommended discussion of completion surgery and options.   On 04/14/2022 she was admitted with RUQ pain, elevated LFTs and gallstone. History of abnormal liver enzymes in the hospital.  She has seen Dr. Allen Norris, GI, and had a complete work up. Final diagnosis for elevated LFTs include had drug-induced liver disease versus idiopathic acute hepatitis. Her last LFTs were on 04/28/22 and improved significantly and she underwent completion surgery.   Inhibin B 8.2   Problem List: Patient Active Problem List   Diagnosis Date Noted   Right upper quadrant abdominal pain 04/14/2022   Abnormal LFTs 04/14/2022   Gallstone 04/14/2022   BMI 45.0-49.9, adult ([place])    Granulosa cell tumor 03/25/2022   Adnexal mass 02/04/2022   Esophageal reflux 04/25/2015   Arthritis of both knees 04/25/2015   OB History     Gravida  3   Para  1  Term      Preterm      AB  2   Living         SAB  2   IAB      Ectopic      Multiple      Live Births              Past Medical History: Past Medical History:  Diagnosis Date    Anemia    Arthritis    BMI 45.0-49.9, adult ([facility])    Chicken pox    Complication of anesthesia    GERD (gastroesophageal reflux disease)    PONV (postoperative nausea and vomiting)     Past Surgical History: Past Surgical History:  Procedure Laterality Date   CYSTOSCOPY N/A 06/03/2022   Procedure: CYSTOSCOPY;  Surgeon: Gillis Ends, MD;  Location: [facility] OR[facility];  Service: Gynecology;  Laterality: N/A;   ROBOTIC ASSISTED SALPINGO OOPHERECTOMY Right 03/04/2022   Procedure: XI ROBOTIC ASSISTED SALPINGO OOPHORECTOMY, LYSIS OF ADHESIONS;  Surgeon: Gillis Ends, MD;  Location: [facility] OR[facility];  Service: Gynecology;  Laterality: Right;   ROBOTIC ASSISTED TOTAL HYSTERECTOMY WITH BILATERAL SALPINGO OOPHERECTOMY Left 06/03/2022   Procedure: XI ROBOTIC ASSISTED TOTAL HYSTERECTOMY WITH LEFT SALPINGO OOPHORECTOMY, BIOPSIES, PELVIC WASHING;  Surgeon: Gillis Ends, MD;  Location: [facility] OR[facility];  Service: Gynecology;  Laterality: Left;   SALPINGECTOMY Left    ectopic    Past Gynecologic History:  Menarche: 13 Menstrual details: Lasts 5 days Menses regular: yes Last Menstrual Period: Unknown History of Abnormal pap: no,  Last pap: NILM; HRHPV negative 01/07/2022 History of STDs: trichomonas 2017 Contraception: None   OB History:  OB History  Gravida Para Term Preterm AB Living  '3 1     2    '$ SAB IAB Ectopic Multiple Live Births  2            # Outcome Date GA Lbr Len/2nd Weight Sex Delivery Anes PTL Lv  3 SAB           2 SAB           1 Para             Family History: Family History  Problem Relation Age of Onset   Hypertension Mother    Stroke Father    Diabetes Paternal Grandmother    Cancer Neg Hx    Breast cancer Neg Hx     Social History: Social History   Socioeconomic History   Marital status: Single    Spouse name: Not on file   Number of children: Not on file   Years of education: Not on file   Highest education level: Not on file  Occupational  History   Not on file  Tobacco Use   Smoking status: Never   Smokeless tobacco: Never  Vaping Use   Vaping Use: Never used  Substance and Sexual Activity   Alcohol use: No    Alcohol/week: 0.0 standard drinks of alcohol   Drug use: No   Sexual activity: Not Currently    Birth control/protection: None  Other Topics Concern   Not on file  Social History Narrative   Not on file   Social Determinants of Health   Financial Resource Strain: Not on file  Food Insecurity: Not on file  Transportation Needs: Not on file  Physical Activity: Not on file  Stress: Not on file  Social Connections: Not on file  Intimate Partner Violence: Not on file    Allergies:  Allergies  Allergen Reactions   Methocarbamol Other (See Comments)    headache   Mushroom Extract Complex Nausea And Vomiting   Tramadol     Other reaction(s): Other (See Comments) Increased pain and irritability     Current Medications: Current Outpatient Medications  Medication Sig Dispense Refill   ibuprofen (ADVIL) 600 MG tablet Take 1 tablet (600 mg total) by mouth every 6 (six) hours. 30 tablet 0   Multiple Vitamin (MULTIVITAMIN WITH MINERALS) TABS tablet Take 1 tablet by mouth daily.     ondansetron (ZOFRAN-ODT) 4 MG disintegrating tablet Take 1 tablet (4 mg total) by mouth every 6 (six) hours as needed for nausea. 20 tablet 0   trolamine salicylate (ASPERCREME) 10 % cream Apply 1 application. topically as needed for muscle pain.     No current facility-administered medications for this visit.    Review of Systems General: no complaints  HEENT: no complaints  Lungs: no complaints  Cardiac: no complaints  GI: no complaints  GU: no complaints  Musculoskeletal: no complaints  Extremities: no complaints  Skin: no complaints  Neuro: no complaints  Endocrine: no complaints  Psych: no complaints      Objective:  Physical Examination:  BP (!) 145/85 (BP Location: Left Arm, Patient Position: Sitting)    Pulse 96   Temp 97.6 F (36.4 C) (Tympanic)   Ht '5\' 7"'$  (1.702 m)   Wt 287 lb (130.2 kg)   LMP  (LMP Unknown) Comment: last in Feb  BMI 44.95 kg/m   GENERAL: Patient is a well appearing female in no acute distress HEENT:  PERRL, atraumatic and normocephalic ABDOMEN:  Soft, nontender, nondistended. No masses, ascites or hernia EXTREMITIES:  No peripheral edema.   SKIN:  Incisions all intact. No erythema NEURO:  Nonfocal. Well oriented.  Appropriate affect.  Pelvic: EGBUS: no lesions Cervix: absent Vagina: very long vaginal vault. No lesions, no discharge or bleeding. Cuff unable to be seen due to long length. On palpation intact and nontender. Suture palpated at the mid cuff.  Uterus: absent BME: no palpable masses    Lab Review 12/06/2021 Lab Results  Component Value Date   WBC 6.1 04/14/2022   HGB 14.7 04/14/2022   HCT 45.3 04/14/2022   MCV 69.4 (L) 04/14/2022   PLT 163 04/14/2022     Chemistry      Component Value Date/Time   NA 137 04/15/2022 0444   K 4.0 04/15/2022 0444   CL 106 04/15/2022 0444   CO2 26 04/15/2022 0444   BUN 12 04/15/2022 0444   CREATININE 0.79 04/15/2022 0444      Component Value Date/Time   CALCIUM 8.5 (L) 04/15/2022 0444   ALKPHOS 103 05/27/2022 1312   AST 15 05/27/2022 1312   ALT 18 05/27/2022 1312   BILITOT 0.5 05/27/2022 1312        Radiologic Imaging: 12/06/2021 CT A/P TECHNIQUE: Multidetector CT imaging of the abdomen and pelvis was performed using the standard protocol following bolus administration of intravenous contrast.   RADIATION DOSE REDUCTION: This exam was performed according to the departmental dose-optimization program which includes automated exposure control, adjustment of the mA and/or kV according to patient size and/or use of iterative reconstruction technique.   CONTRAST:  182m OMNIPAQUE IOHEXOL 350 MG/ML SOLN   COMPARISON:  None.   FINDINGS: Lower chest: LEFT lower lobe pulmonary nodule measures 2 mm  (series 4, image 10.   Hepatobiliary: No focal liver abnormality is seen. No gallstones, gallbladder wall thickening, or biliary  dilatation.   Pancreas: Unremarkable. No pancreatic ductal dilatation or surrounding inflammatory changes.   Spleen: There is a 13 mm round cystic density of the splenic hilum, nonspecific but likely a splenic cyst or hemangioma.   Adrenals/Urinary Tract: Adrenal glands are unremarkable. Kidneys enhance symmetrically. No hydronephrosis. No obstructing nephrolithiasis. Bladder is decompressed.   Stomach/Bowel: No evidence of bowel obstruction. Extensive diverticulosis throughout the colon without evidence of acute diverticulitis. Appendix is normal in appearance. Tip terminates near the region of the RIGHT adnexal mass. Small hiatal hernia. Duodenal diverticulum.   Vascular/Lymphatic: Aorta is normal in course and caliber. No suspicious lymphadenopathy identified.   Reproductive: In the RIGHT adnexa, there is a 6.7 x 5.5 x 5.1 cm mixed density mass which contains a combination of low level cystic density and intermediate density. LEFT ovary is unremarkable. Uterus is unremarkable.   Other: No free air.   Musculoskeletal: No acute osseous abnormality.   IMPRESSION: 1. A 6.7 cm RIGHT adnexal mixed density mass is nonspecific. This may reflect a hemorrhagic cyst, ovarian cystic neoplasm or sequela of RIGHT ovarian torsion. Recommend further dedicated evaluation with pelvic ultrasound versus pelvic MRI and consideration of gynecologic consultation. 2. Appendix is unremarkable. 3. 2 mm left solid pulmonary nodule. No routine follow-up imaging is recommended per Fleischner Society Guidelines. These guidelines do not apply to immunocompromised patients and patients with cancer. Follow up in patients with significant comorbidities as clinically warranted. For lung cancer screening, adhere to Lung-RADS guidelines. Reference: Radiology.  2017; 284(1):228-43.       Assessment:  Valerie Andrews is a 56 y.o. female diagnosed with stage IC granulosa cell cancer.    History of left salpingectomy for ectopic pregnancy. On operative findings "over the left adnexa, adherent to posterior low cervix and to the right ovary"  Pulmonary nodule, follow up CXR negative will continue to monitor with Chest CT.   Splenic lesion, unknown etiology  History of elevated LFTs, drug-induced liver disease versus idiopathic acute hepatitis, now resolved    Medical co-morbidities complicating care: prior abdominal surgery. Morbid obesity. Body mass index is 44.95 kg/m.  Plan:   Problem List Items Addressed This Visit       Other   Granulosa cell tumor - Primary   Relevant Orders   CT CHEST ABDOMEN PELVIS W CONTRAST   Inhibin B   Other Visit Diagnoses     Lung nodule       Relevant Orders   CT CHEST ABDOMEN PELVIS W CONTRAST   Inhibin B   Splenic lesion       Counseling and coordination of care            We discussed her pathology results from completion surgery with robotic/laparoscopic removal of the uterus, biopsies, washings left salpingo-oophorectomy, biopsies and washings.  She was given a copy of her pathology report and we reviewed the excellent prognosis with granulosa cell tumors.  I advised follow-up approximately every 4 to 6 months for the first 5 years and then annually thereafter.  I discussed that these cancers can recur even up to 20 years later.  I also expressed concern that she may be at high risk for recurrence given the positive washings.  She did have cystic rupture during the first surgery and I suspect that is the etiology of the washings positivity given that her subsequent is biopsies were all completely negative.  I also asked the team to set up a tumor board review of her pathology results and discussion of  management.  Given the positive washings I would like to review the role of adjuvant hormonal  therapy versus observation.  I think observation is sufficient given the overall indolent nature of these cancers.  There are advantages as well as disadvantages/risks for hormonal therapy.  For surveillance we will obtain a CT scan at her January 2023 visit to follow-up on the pulmonary nodule and splenic lesion.  We have also ordered an inhibin B level.  While her inhibin B level was normal this was performed after her first surgery.  She does not have menopausal symptoms.  The patient's diagnosis, an outline of the further diagnostic and laboratory studies which will be required, the recommendation, and alternatives were discussed. Counseling and coordination of care was performed.  All questions were answered to the patient's satisfaction.   Morine Kohlman Gaetana Michaelis, MD    CC:  Will Bonnet,  MD [address] [place],  [place] [address] [phone]

## 2022-07-08 ENCOUNTER — Ambulatory Visit: Payer: 59

## 2022-07-22 ENCOUNTER — Inpatient Hospital Stay: Payer: 59 | Attending: Obstetrics and Gynecology

## 2022-07-22 NOTE — Progress Notes (Signed)
Tumor Board Documentation  Valerie Andrews was presented by Mariea Clonts, Nurse Navigator, at our Tumor Board on 07/22/2022, which included representatives from medical oncology, surgical oncology, pathology, navigation, research.  Edit currently presents as a current patient with history of the following treatments: surgical intervention(s).  Additionally, we reviewed previous medical and familial history, history of present illness, and recent lab results along with all available histopathologic and imaging studies. The tumor board considered available treatment options and made the following recommendations: Active surveillance Observation  The following procedures/referrals were also placed: No orders of the defined types were placed in this encounter.   Clinical Trial Status: not discussed   Staging used: Pathologic Stage AJCC Staging:       Group: Stage IC Granulosa cell cancer   National site-specific guidelines   were discussed with respect to the case.  Tumor board is a meeting of clinicians from various specialty areas who evaluate and discuss patients for whom a multidisciplinary approach is being considered. Final determinations in the plan of care are those of the provider(s). The responsibility for follow up of recommendations given during tumor board is that of the provider.   Today's extended care, comprehensive team conference, Valerie Andrews was not present for the discussion and was not examined.   Multidisciplinary Tumor Board is a multidisciplinary case peer review process.  Decisions discussed in the Multidisciplinary Tumor Board reflect the opinions of the specialists present at the conference without having examined the patient.  Ultimately, treatment and diagnostic decisions rest with the primary provider(s) and the patient.

## 2022-07-28 DIAGNOSIS — R300 Dysuria: Secondary | ICD-10-CM | POA: Diagnosis not present

## 2022-07-28 DIAGNOSIS — N390 Urinary tract infection, site not specified: Secondary | ICD-10-CM | POA: Diagnosis not present

## 2022-10-06 ENCOUNTER — Ambulatory Visit: Payer: 59

## 2022-11-19 ENCOUNTER — Other Ambulatory Visit: Payer: Self-pay

## 2022-11-19 MED ORDER — PROMETHAZINE-DM 6.25-15 MG/5ML PO SYRP
ORAL_SOLUTION | ORAL | 0 refills | Status: AC
Start: 2022-11-19 — End: ?
  Filled 2022-11-19: qty 118, 6d supply, fill #0

## 2022-11-19 MED ORDER — DOXYCYCLINE HYCLATE 100 MG PO TABS
ORAL_TABLET | ORAL | 0 refills | Status: AC
  Filled 2022-11-19: qty 20, 10d supply, fill #0

## 2022-11-19 MED ORDER — PREDNISONE 20 MG PO TABS
ORAL_TABLET | ORAL | 0 refills | Status: AC
  Filled 2022-11-19: qty 10, 5d supply, fill #0

## 2022-11-25 ENCOUNTER — Ambulatory Visit
Admission: RE | Admit: 2022-11-25 | Discharge: 2022-11-25 | Disposition: A | Discharge status: Home / Self Care | Payer: 59 | Source: Ambulatory Visit | Attending: Obstetrics and Gynecology | Admitting: Obstetrics and Gynecology

## 2022-11-25 DIAGNOSIS — R911 Solitary pulmonary nodule: Secondary | ICD-10-CM

## 2022-11-25 DIAGNOSIS — D3910 Neoplasm of uncertain behavior of unspecified ovary: Secondary | ICD-10-CM | POA: Diagnosis present

## 2022-11-25 LAB — POCT I-STAT CREATININE: Creatinine, Ser: 1 mg/dL (ref 0.44–1.00)

## 2022-11-25 MED ORDER — IOHEXOL 350 MG/ML SOLN
100.0000 mL | Freq: Once | INTRAVENOUS | Status: AC | PRN
  Administered 2022-11-25: 100 mL via INTRAVENOUS

## 2022-12-02 ENCOUNTER — Inpatient Hospital Stay: Payer: 59 | Attending: Obstetrics and Gynecology | Admitting: Obstetrics and Gynecology

## 2022-12-02 ENCOUNTER — Inpatient Hospital Stay: Payer: 59

## 2022-12-02 ENCOUNTER — Encounter: Payer: Self-pay | Admitting: Obstetrics and Gynecology

## 2022-12-02 VITALS — BP 117/67 | HR 90 | Temp 97.8°F | Resp 19 | Wt 284.3 lb

## 2022-12-02 DIAGNOSIS — C561 Malignant neoplasm of right ovary: Secondary | ICD-10-CM | POA: Diagnosis not present

## 2022-12-02 DIAGNOSIS — D3910 Neoplasm of uncertain behavior of unspecified ovary: Secondary | ICD-10-CM

## 2022-12-02 DIAGNOSIS — Z6841 Body Mass Index (BMI) 40.0 and over, adult: Secondary | ICD-10-CM | POA: Insufficient documentation

## 2022-12-02 DIAGNOSIS — R911 Solitary pulmonary nodule: Secondary | ICD-10-CM | POA: Diagnosis not present

## 2022-12-02 DIAGNOSIS — C7989 Secondary malignant neoplasm of other specified sites: Secondary | ICD-10-CM | POA: Diagnosis not present

## 2022-12-02 NOTE — Progress Notes (Signed)
Gynecologic Oncology Interval Visit   Referring Provider: Prentice Docker, MD  Chief Concern: granulosa cell tumor  Subjective:  Valerie Andrews is a 57 y.o. female who presents for surveillance of stage IC granulosa cell tumor.   Returns today for follow up and has no complaints.    Gyn Oncology History She presented to the ER on 12/06/2021 with Pt c/o RLQ abd pain that radiates to her R side that has been intermittent for the past couple of days but was worse.   12/06/2021 CT abdomen and pelvis     IMPRESSION: 1. A 6.7 cm RIGHT adnexal mixed density mass is nonspecific. This may reflect a hemorrhagic cyst, ovarian cystic neoplasm or sequela of RIGHT ovarian torsion. Recommend further dedicated evaluation with pelvic ultrasound versus pelvic MRI and consideration of gynecologic consultation. 2. Appendix is unremarkable. 3. 2 mm left solid pulmonary nodule. No routine follow-up imaging is recommended per Fleischner Society Guidelines. These guidelines do not apply to immunocompromised patients and patients with cancer. Follow up in patients with significant comorbidities as clinically warranted. For lung cancer screening, adhere to Lung-RADS guidelines. Reference: Radiology. 2017; 284(1):228-43.   12/06/2021 Pelvic US  Right ovary: Measurements: 5.5 x 4.5 x 4.1 cm = volume: 52 mL. There appears to be a predominantly solid but complex and multi septated mass within the right ovary. It appears to have blood flow. Left ovary Not visualized. IMPRESSION: Probable predominantly solid but complex and multi septated mass within the right ovary measuring at least 5 cm. Further evaluation with MRI with and without gadolinium is recommended to evaluate for possible ovarian neoplasm.    Patient's ultrasound is most consistent with ovarian mass.  I spoke to Dr. Glennon Mac of OB/GYN who recommends obtaining a ROMA blood test to evaluate ovarian tumor markers.  He will follow-up in the office early this  week for likely referral to gynecology oncology   ROMA:  If the patient is postmenopausal, then the postmenopausal ROMA score  of less than 2.99 is consistent with a low likelihood of finding a malignancy on surgery.  Cancer Antigen (CA) 125 0.0 - 38.1 U/mL 13.3   Comment: (NOTE)  Roche [employer]ostics Electrochemiluminescence Immunoassay (ECLIA)  Values obtained with different assay methods or kits cannot be  used interchangeably.  Results cannot be interpreted as absolute  evidence of the presence or absence of malignant disease.   HE4 0.0 - 105.2 pmol/L 69.9    Premenopausal Interp: HIGH Comment   Comment: (NOTE)  If the patient is premenopausal, then the premenopausal ROMA score  of greater than or equal to 1.14 is consistent with a high  likelihood of finding a malignancy on surgery.      MRI pelvis 01/30/22 FINDINGS: Urinary Tract:  No abnormality visualized. Bowel:  Descending and sigmoid diverticulosis. Vascular/Lymphatic: No pathologically enlarged lymph nodes. No significant vascular abnormality seen.   Reproductive: Mixed solid cystic mass of the right ovary measuring 7.1 x 6.9 x 4.9 cm, with heterogeneous associated contrast enhancement (series 3, image 15). The left ovary is normal in size and appearance.    IMPRESSION: 1. Mixed solid cystic mass of the right ovary measuring 7.1 x 6.9 x 4.9 cm, highly concerning for primary ovarian malignancy. 2. Normal left ovary. 3. No evidence of pelvic lymphadenopathy or metastatic disease. 4. Descending and sigmoid diverticulosis.  Surgery recommended.   03/04/2022 she underwent exam under anesthesia; Robotic-assisted right salpingo-oophorectomy; lysis of adhesions included enterolysis and ovariolysis, and washings. Intraop pathology concerning for lymphoma vs granulosa cell  tumor. Final pathology granulosa cell tumor. Intraoperative findings included adhesions involving the omentum to the anterior pelvic wall, the appendix was  adherent to the right IP ligament, the rectosigmoid was draped over the left adnexa, adherent to posterior low cervix and to the right ovary. The cyst did rupture intraoperative while trying to lyse the adhesions and irrigation done.  Washings negative before mass leaked.     Pathology:  Tumor Site: Right ovary  Tumor Size: Greatest dimension 6 cm  Histologic Type: Granulosa cell tumor, adult type  Histologic Grade: Not applicable  Ovarian Surface Involvement: Cannot be determined: Specimen received fragmented  Fallopian Tube Surface Involvement: Not identified  On 04/14/2022 she was admitted with RUQ pain, elevated LFTs and gallstone. History of abnormal liver enzymes in the hospital.  She has seen Dr. Allen Norris, GI, and had a complete work up. Final diagnosis for elevated LFTs include had drug-induced liver disease versus idiopathic acute hepatitis. Her last LFTs were on 04/28/22 and improved significantly and she underwent completion surgery.   05/06/2022 Preop EMBx A. ENDOMETRIUM; BIOPSY: SUPERFICIAL FRAGMENTS OF INACTIVE APPEARING ENDOMETRIUM WITH ASSOCIATED BREAKDOWN. NEGATIVE FOR ATYPICAL HYPERPLASIA/EIN AND MALIGNANCY.   Tumor Board recommendations: I recommended discussion of completion surgery and options.   05/25/2022 Follow up CXR for evaluation of pulmonary nodule, negative for concerning findings IMPRESSION: No active cardiopulmonary disease.  06/03/2022 XI ROBOTIC ASSISTED TOTAL HYSTERECTOMY WITH LEFT SALPINGO OOPHORECTOMY, BIOPSIES, PELVIC WASHING and cystoscopy.  DIAGNOSIS: A. ABDOMINAL WALL LESION 1, LEFT; BIOPSY: - MESOTHELIAL-LINED FIBROADIPOSE TISSUE WITH REACTIVE CHANGES. - NEGATIVE FOR MALIGNANCY.  B.  ABDOMINAL WALL LESION 2, LEFT; BIOPSY: - MESOTHELIAL-LINED FIBROADIPOSE TISSUE WITH CHANGES CONSISTENT WITH PRIOR SURGICAL PROCEDURE. - NEGATIVE FOR MALIGNANCY.  C.  FALLOPIAN TUBE ADHESIONS, LEFT; BIOPSY: - MESOTHELIAL-LINED FIBROADIPOSE TISSUE WITH REACTIVE CHANGES. -  NEGATIVE FOR MALIGNANCY.  D.  PELVIC FLOOR, LEFT; BIOPSY: - MESOTHELIAL-LINED FIBROADIPOSE TISSUE WITH REACTIVE CHANGES. - NEGATIVE FOR MALIGNANCY.  E.  BLADDER; BIOPSY: - MESOTHELIAL-LINED FIBROADIPOSE TISSUE WITH REACTIVE CHANGES. - NEGATIVE FOR MALIGNANCY.  F.  PELVIC FLOOR, RIGHT; BIOPSY: - MESOTHELIAL-LINED FIBROADIPOSE TISSUE WITH REACTIVE CHANGES. - NEGATIVE FOR MALIGNANCY.  G.  PELVIC, RIGHT; BIOPSY: - MESOTHELIAL-LINED FIBROADIPOSE TISSUE WITH REACTIVE CHANGES. - NEGATIVE FOR MALIGNANCY.  H.  PELVIC, LEFT UPPER; BIOPSY: - MESOTHELIAL-LINED FIBROADIPOSE TISSUE WITH REACTIVE CHANGES. - NEGATIVE FOR MALIGNANCY.  I.  PERI POLYP, LEFT; BIOPSY: - FIBROADIPOSE TISSUE WITH THERMAL ARTIFACT. - NEGATIVE FOR MALIGNANCY.  J.  UTERUS WITH CERVIX AND LEFT FALLOPIAN TUBE AND OVARY; TOTAL HYSTERECTOMY WITH UNILATERAL SALPINGO-OOPHORECTOMY: - CERVICITIS WITH SQUAMOUS METAPLASIA. - WEAKLY PROLIFERATIVE ENDOMETRIUM. - UNREMARKABLE MYOMETRIUM. - UTERINE FIBROUS SEROSAL ADHESIONS. - LEFT OVARY WITH FOLLICULAR CYST AND SEROSAL FIBROUS ADHESIONS. - LEFT FALLOPIAN TUBE WITH SEROSAL FIBROUS ADHESIONS. - NEGATIVE FOR MALIGNANCY.  DIAGNOSIS:  A. PELVIC WASHING:  - POSITIVE FOR MALIGNANCY.  - INVOLVEMENT BY PATIENT'S KNOWN GRANULOSA CELL TUMOR.  Preop  FSH mIU/mL 3.1    Postop        Component Ref Range & Units 1 mo ago   FSH mIU/mL 33.8        Inhibin B 8.2   Problem List: Patient Active Problem List   Diagnosis Date Noted   Right upper quadrant abdominal pain 04/14/2022   Abnormal LFTs 04/14/2022   Gallstone 04/14/2022   BMI 45.0-49.9, adult ([place])    Granulosa cell tumor 03/25/2022   Adnexal mass 02/04/2022   Esophageal reflux 04/25/2015   Arthritis of both knees 04/25/2015   OB History     Gravida  3   Para  1   Term      Preterm      AB  2   Living         SAB  2   IAB      Ectopic      Multiple      Live Births              Past  Medical History: Past Medical History:  Diagnosis Date   Anemia    Arthritis    BMI 45.0-49.9, adult ([facility])    Chicken pox    Complication of anesthesia    GERD (gastroesophageal reflux disease)    PONV (postoperative nausea and vomiting)     Past Surgical History: Past Surgical History:  Procedure Laterality Date   CYSTOSCOPY N/A 06/03/2022   Procedure: CYSTOSCOPY;  Surgeon: Gillis Ends, MD;  Location: [facility];  Service: Gynecology;  Laterality: N/A;   ROBOTIC ASSISTED SALPINGO OOPHERECTOMY Right 03/04/2022   Procedure: XI ROBOTIC ASSISTED SALPINGO OOPHORECTOMY, LYSIS OF ADHESIONS;  Surgeon: Gillis Ends, MD;  Location: [facility];  Service: Gynecology;  Laterality: Right;   ROBOTIC ASSISTED TOTAL HYSTERECTOMY WITH BILATERAL SALPINGO OOPHERECTOMY Left 06/03/2022   Procedure: XI ROBOTIC ASSISTED TOTAL HYSTERECTOMY WITH LEFT SALPINGO OOPHORECTOMY, BIOPSIES, PELVIC WASHING;  Surgeon: Gillis Ends, MD;  Location: [facility];  Service: Gynecology;  Laterality: Left;   SALPINGECTOMY Left    ectopic    Past Gynecologic History:  Menarche: 13 Menstrual details: Lasts 5 days Menses regular: yes Last Menstrual Period: Unknown History of Abnormal pap: no,  Last pap: NILM; HRHPV negative 01/07/2022 History of STDs: trichomonas 2017 Contraception: None   OB History:  OB History  Gravida Para Term Preterm AB Living  '3 1     2    '$ SAB IAB Ectopic Multiple Live Births  2            # Outcome Date GA Lbr Len/2nd Weight Sex Delivery Anes PTL Lv  3 SAB           2 SAB           1 Para             Family History: Family History  Problem Relation Age of Onset   Hypertension Mother    Stroke Father    Diabetes Paternal Grandmother    Cancer Neg Hx    Breast cancer Neg Hx     Social History: Social History   Socioeconomic History   Marital status: Single    Spouse name: Not on file   Number of children: Not on file   Years of education: Not on file    Highest education level: Not on file  Occupational History   Not on file  Tobacco Use   Smoking status: Never   Smokeless tobacco: Never  Vaping Use   Vaping Use: Never used  Substance and Sexual Activity   Alcohol use: No    Alcohol/week: 0.0 standard drinks of alcohol   Drug use: No   Sexual activity: Not Currently    Birth control/protection: None  Other Topics Concern   Not on file  Social History Narrative   Not on file   Social Determinants of Health   Financial Resource Strain: Not on file  Food Insecurity: Not on file  Transportation Needs: Not on file  Physical Activity: Not on file  Stress: Not on file  Social Connections: Not on file  Intimate Partner  Violence: Not on file    Allergies: Allergies  Allergen Reactions   Methocarbamol Other (See Comments)    headache   Mushroom Extract Complex Nausea And Vomiting   Tramadol     Other reaction(s): Other (See Comments) Increased pain and irritability     Current Medications: Current Outpatient Medications  Medication Sig Dispense Refill   ibuprofen (ADVIL) 600 MG tablet Take 1 tablet (600 mg total) by mouth every 6 (six) hours. 30 tablet 0   meloxicam (MOBIC) 7.5 MG tablet Take 7.5 mg by mouth 2 (two) times daily.     Multiple Vitamin (MULTIVITAMIN WITH MINERALS) TABS tablet Take 1 tablet by mouth daily.     ondansetron (ZOFRAN-ODT) 4 MG disintegrating tablet Take 1 tablet (4 mg total) by mouth every 6 (six) hours as needed for nausea. 20 tablet 0   doxycycline (VIBRA-TABS) 100 MG tablet Take 1 tablet (100 mg total) by mouth 2 (two) times daily for 10 days (Patient not taking: Reported on 12/02/2022) 20 tablet 0   predniSONE (DELTASONE) 20 MG tablet Take 1 tablet (20 mg total) by mouth 2 (two) times daily for 5 days (Patient not taking: Reported on 12/02/2022) 10 tablet 0   promethazine-dextromethorphan (PROMETHAZINE-DM) 6.25-15 MG/5ML syrup Take 5 mLs by mouth every 6 (six) hours as needed for up to 7 days  (Patient not taking: Reported on 12/02/2022) 118 mL 0   trolamine salicylate (ASPERCREME) 10 % cream Apply 1 application. topically as needed for muscle pain. (Patient not taking: Reported on 12/02/2022)     No current facility-administered medications for this visit.    Review of Systems General: no complaints  HEENT: no complaints  Lungs: no complaints  Cardiac: no complaints  GI: no complaints  GU: no complaints  Musculoskeletal: no complaints  Extremities: no complaints  Skin: no complaints  Neuro: no complaints  Endocrine: no complaints  Psych: no complaints      Objective:  Physical Examination:  BP 117/67   Pulse 90   Temp 97.8 F (36.6 C)   Resp 19   Wt 284 lb 4.8 oz (129 kg)   LMP  (LMP Unknown) Comment: last in Feb  SpO2 98%   BMI 44.53 kg/m   GENERAL: Patient is a well appearing female in no acute distress HEENT:  PERRL, atraumatic and normocephalic ABDOMEN:  Soft, nontender, nondistended. No masses, ascites or hernia EXTREMITIES:  No peripheral edema.   SKIN:  Incisions all intact. No erythema NEURO:  Nonfocal. Well oriented.  Appropriate affect.  Pelvic: EGBUS: no lesions Cervix: absent Vagina: very long vaginal vault. No lesions, no discharge or bleeding. Cuff unable to be seen due to long length. On palpation intact and nontender. Suture palpated at the mid cuff.  Uterus: absent BME: no palpable masses    Lab Review 12/06/2021 Lab Results  Component Value Date   WBC 6.1 04/14/2022   HGB 14.7 04/14/2022   HCT 45.3 04/14/2022   MCV 69.4 (L) 04/14/2022   PLT 163 04/14/2022     Chemistry      Component Value Date/Time   NA 137 04/15/2022 0444   K 4.0 04/15/2022 0444   CL 106 04/15/2022 0444   CO2 26 04/15/2022 0444   BUN 12 04/15/2022 0444   CREATININE 1.00 11/25/2022 0924      Component Value Date/Time   CALCIUM 8.5 (L) 04/15/2022 0444   ALKPHOS 103 05/27/2022 1312   AST 15 05/27/2022 1312   ALT 18 05/27/2022 1312   BILITOT 0.5  05/27/2022 1312        Radiologic Imaging: 12/06/2021 CT A/P     IMPRESSION: 1. A 6.7 cm RIGHT adnexal mixed density mass is nonspecific. This may reflect a hemorrhagic cyst, ovarian cystic neoplasm or sequela of RIGHT ovarian torsion. Recommend further dedicated evaluation with pelvic ultrasound versus pelvic MRI and consideration of gynecologic consultation. 2. Appendix is unremarkable. 3. 2 mm left solid pulmonary nodule. No routine follow-up imaging is recommended per Fleischner Society Guidelines. These guidelines do not apply to immunocompromised patients and patients with cancer. Follow up in patients with significant comorbidities as clinically warranted. For lung cancer screening, adhere to Lung-RADS guidelines. Reference: Radiology. 2017; 284(1):228-43.   CT scan C/A/P 11/25/22 Recent CT scan 1/24 shows stable pulmonary nodules.    IMPRESSION: 1. Status post hysterectomy and oophorectomy without convincing evidence of local recurrence. 2. Stable pulmonary nodules in the lung bases with few scattered small pulmonary nodules measuring up to 4 mm in the bilateral upper/mid lung fields not included on prior imaging, nonspecific and while favored benign sequela of infectious or inflammatory etiology. Technically metastatic disease while not favored is not excluded. Suggest attention on short-term interval follow-up dedicated chest CT in 3 months. 3. No evidence of metastatic disease in the abdomen or pelvis. 4. Pancolonic diverticulosis without evidence of acute diverticulitis.    Assessment:  ELANA JIAN is a 57 y.o. female diagnosed with stage IC granulosa cell cancer. Presented to ED with pelvic pain 1/23 and CT scan showed 6.7 cm mixed density mass.  Ultrasound confirmed presence of the mass and it had some septations and solid areas.  CA125 and HE4 normal.  MRI in 3/23 showed 7 cm persistent mass with cystic and solid areas.  She underwent robotic RSO 4/23.   Adhesiolysis was performed involving adhesions between the mass and the posterior uterus and sidewall. During this process the cyst was ruptured and clear fluid removed. There was no tumor spillage.  Washings obtained before the spillage were negative.  Pathology frozen section revealed granulosa tumor vs stromal tumor vs lymphoma. Final pathology showed adult GCT.  Inhibin B post op normal.  Completion surgery done 06/03/22 with robotic removal of the uterus, biopsies, washings left salpingo-oophorectomy and washings. No residual disease on pathology, but positive peritoneal washings.  Presented at tumor board in 9/23 and risk of recurrence with positive washings at completion surgery discussed. Hormonal therapy with Letrozole is an option, but not of proven benefit in reducing risk of recurrence.  In view of this active surveillance recommended.   Pulmonary nodules likely benign on CT scan. Follow up CT C/A/P today shows persistent nodules stable and likely benign.  No evidence of recurrence in abdomen and pelvis.   Patient without disease on exam today.  Splenic lesion, unknown etiology, likely hemangioma and stable on recent CT.   History of elevated LFTs, drug-induced liver disease versus idiopathic acute hepatitis, now resolved    Medical co-morbidities complicating care: prior abdominal surgery. Morbid obesity. Body mass index is 44.53 kg/m.  Plan:   Problem List Items Addressed This Visit       Genitourinary   Granulosa cell tumor - Primary    Will check inhibin A/B today.  Plan for follow up of lung nodules in 3 months with CT scan.   She does not have menopausal symptoms.  The patient's diagnosis, an outline of the further diagnostic and laboratory studies which will be required, the recommendation, and alternatives were discussed. Counseling and coordination of care was  performed.  All questions were answered to the patient's satisfaction.   Mellody Drown, MD  CC:  Will Bonnet,  MD [address] [place],  [place] [address] [phone]

## 2022-12-03 ENCOUNTER — Telehealth: Payer: Self-pay | Admitting: Gastroenterology

## 2022-12-03 ENCOUNTER — Encounter: Payer: Self-pay | Admitting: Gastroenterology

## 2022-12-03 ENCOUNTER — Ambulatory Visit: Payer: 59 | Admitting: Gastroenterology

## 2022-12-03 NOTE — Progress Notes (Deleted)
Primary Care Physician: Will Bonnet, MD  Primary Gastroenterologist:  Dr. [name]  No chief complaint on file.   HPI: Valerie Andrews is a 57 y.o. female here with a report of abdominal pain and gas.  The patient had seen me in the past for abnormal liver enzymes.  That was back in June 2023.  Back in June the patient's AST and ALT had come back to normal but the patient's alkaline phosphatase remains elevated.  The patient had a right upper quadrant ultrasound that showed:  IMPRESSION: 1. Gallbladder sludge and calculi. The gallbladder is tender but there is no wall thickening or fat edema to confirm acute cholecystitis. 2. Hepatic steatosis.  The patient has been diagnosed with right adnexal granulosa cell tumor.   Past Medical History:  Diagnosis Date   Anemia    Arthritis    BMI 45.0-49.9, adult (HCC)    Chicken pox    Complication of anesthesia    GERD (gastroesophageal reflux disease)    PONV (postoperative nausea and vomiting)     Current Outpatient Medications  Medication Sig Dispense Refill   doxycycline (VIBRA-TABS) 100 MG tablet Take 1 tablet (100 mg total) by mouth 2 (two) times daily for 10 days (Patient not taking: Reported on 12/02/2022) 20 tablet 0   ibuprofen (ADVIL) 600 MG tablet Take 1 tablet (600 mg total) by mouth every 6 (six) hours. 30 tablet 0   meloxicam (MOBIC) 7.5 MG tablet Take 7.5 mg by mouth 2 (two) times daily.     Multiple Vitamin (MULTIVITAMIN WITH MINERALS) TABS tablet Take 1 tablet by mouth daily.     ondansetron (ZOFRAN-ODT) 4 MG disintegrating tablet Take 1 tablet (4 mg total) by mouth every 6 (six) hours as needed for nausea. 20 tablet 0   predniSONE (DELTASONE) 20 MG tablet Take 1 tablet (20 mg total) by mouth 2 (two) times daily for 5 days (Patient not taking: Reported on 12/02/2022) 10 tablet 0   promethazine-dextromethorphan (PROMETHAZINE-DM) 6.25-15 MG/5ML syrup Take 5 mLs by mouth every 6 (six) hours as needed for up to 7  days (Patient not taking: Reported on 12/02/2022) 118 mL 0   trolamine salicylate (ASPERCREME) 10 % cream Apply 1 application. topically as needed for muscle pain. (Patient not taking: Reported on 12/02/2022)     No current facility-administered medications for this visit.    Allergies as of 12/03/2022 - Review Complete 12/02/2022  Allergen Reaction Noted   Methocarbamol Other (See Comments) 02/26/2017   Mushroom extract complex Nausea And Vomiting 05/26/2022   Tramadol  01/07/2022    ROS:  General: Negative for anorexia, weight loss, fever, chills, fatigue, weakness. ENT: Negative for hoarseness, difficulty swallowing , nasal congestion. CV: Negative for chest pain, angina, palpitations, dyspnea on exertion, peripheral edema.  Respiratory: Negative for dyspnea at rest, dyspnea on exertion, cough, sputum, wheezing.  GI: See history of present illness. GU:  Negative for dysuria, hematuria, urinary incontinence, urinary frequency, nocturnal urination.  Endo: Negative for unusual weight change.    Physical Examination:   LMP  (LMP Unknown) Comment: last in Feb  General: Well-nourished, well-developed in no acute distress.  Eyes: No icterus. Conjunctivae pink. Lungs: Clear to auscultation bilaterally. Non-labored. Heart: Regular rate and rhythm, no murmurs rubs or gallops.  Abdomen: Bowel sounds are normal, nontender, nondistended, no hepatosplenomegaly or masses, no abdominal bruits or hernia , no rebound or guarding.   Extremities: No lower extremity edema. No clubbing or deformities. Neuro: Alert and oriented x  3.  Grossly intact. Skin: Warm and dry, no jaundice.   Psych: Alert and cooperative, normal mood and affect.  Labs:    Imaging Studies: CT CHEST ABDOMEN PELVIS W CONTRAST  Result Date: 11/25/2022 CLINICAL DATA:  History of endometrial carcinoma and granulosa cell tumor. Surveillance. * Tracking Code: BO * EXAM: CT CHEST, ABDOMEN, AND PELVIS WITH CONTRAST TECHNIQUE:  Multidetector CT imaging of the chest, abdomen and pelvis was performed following the standard protocol during bolus administration of intravenous contrast. RADIATION DOSE REDUCTION: This exam was performed according to the departmental dose-optimization program which includes automated exposure control, adjustment of the mA and/or kV according to patient size and/or use of iterative reconstruction technique. CONTRAST:  127m OMNIPAQUE IOHEXOL 350 MG/ML SOLN COMPARISON:  MRI pelvis January 29, 2022 and CT abdomen pelvis December 06, 2021. FINDINGS: CT CHEST FINDINGS Cardiovascular: Normal caliber thoracic aorta. No central pulmonary embolus on this nondedicated study. Normal size heart. No significant pericardial effusion/thickening. Mediastinum/Nodes: No suspicious thyroid nodule. No pathologically enlarged mediastinal, hilar or axillary lymph nodes. Tiny hiatal hernia. Lungs/Pleura: Stable pulmonary nodules in the bilateral lung bases. For instance: -Tiny 2 mm left lower lobe pulmonary nodule on image 114/3, unchanged. -subpleural 4 mm right lower lobe subpleural pulmonary nodule on image 108/3 unchanged. Small pulmonary nodules in the bilateral upper/mid lung fields not included on prior imaging. For reference: -Right upper lobe pulmonary nodule measures 3 mm on image 65/3 -Subpleural 4 mm pulmonary nodule in the right upper lobe on image 39/3. No pleural effusion.  No pneumothorax. Musculoskeletal: No aggressive lytic or blastic lesion of bone. Multilevel degenerative change of the spine with bridging anterior vertebral osteophytes. CT ABDOMEN PELVIS FINDINGS Hepatobiliary: No suspicious hepatic lesion. Gallbladder is unremarkable. No biliary ductal dilation. Pancreas: No pancreatic ductal dilation or evidence of acute inflammation Spleen: Stable small cystic lesion in the splenic hilum measuring 10 mm on image 56/2 likely reflecting a lymphangioma or hemangioma. Adrenals/Urinary Tract: Bilateral adrenal glands  appear normal. No hydronephrosis. Kidneys demonstrate symmetric enhancement and excretion of contrast material. Urinary bladder is minimally distended limiting evaluation. Stomach/Bowel: Stomach is minimally distended limiting evaluation. Duodenal diverticula. No pathologic dilation of small or large bowel. Normal appendix. Pancolonic diverticulosis, left-sided predominant, without evidence of acute diverticulitis. Vascular/Lymphatic: Normal caliber abdominal aorta. Smooth IVC contours. No pathologically enlarged mediastinal or hilar lymph nodes. Reproductive: Uterus is surgically absent common no enhancing soft tissue nodularity along the vaginal cuff. No suspicious adnexal mass Other: No significant abdominopelvic free fluid. Small amount of nonspecific fluid in the subcutaneous tissues posterior to the paraspinal musculature is similar prior, commonly reflecting sequela of sedentary positioning. Musculoskeletal: No aggressive lytic or blastic lesion of bone. Multilevel degenerative changes spine. IMPRESSION: 1. Status post hysterectomy and oophorectomy without convincing evidence of local recurrence. 2. Stable pulmonary nodules in the lung bases with few scattered small pulmonary nodules measuring up to 4 mm in the bilateral upper/mid lung fields not included on prior imaging, nonspecific and while favored benign sequela of infectious or inflammatory etiology. Technically metastatic disease while not favored is not excluded. Suggest attention on short-term interval follow-up dedicated chest CT in 3 months. 3. No evidence of metastatic disease in the abdomen or pelvis. 4. Pancolonic diverticulosis without evidence of acute diverticulitis. Electronically Signed   By: JDahlia BailiffM.D.   On: 11/25/2022 10:20    Assessment and Plan:   Valerie AXLINEis a 57y.o. y/o female ***     DLucilla Lame MD. FMarval Regal   Note:  This dictation was prepared with Dragon dictation along with smaller phrase technology. Any  transcriptional errors that result from this process are unintentional.

## 2022-12-03 NOTE — Telephone Encounter (Signed)
Pt no showed letter was sent

## 2022-12-04 ENCOUNTER — Other Ambulatory Visit: Payer: Self-pay

## 2022-12-04 LAB — INHIBIN B: Inhibin B: 8.2 pg/mL

## 2022-12-08 LAB — INHIBIN A: Inhibin-A: 0.6 pg/mL

## 2022-12-24 ENCOUNTER — Other Ambulatory Visit (HOSPITAL_COMMUNITY): Payer: Self-pay

## 2023-02-26 ENCOUNTER — Ambulatory Visit: Admission: RE | Admit: 2023-02-26 | Payer: 59 | Source: Ambulatory Visit

## 2023-03-03 ENCOUNTER — Inpatient Hospital Stay: Payer: Self-pay

## 2023-03-17 ENCOUNTER — Inpatient Hospital Stay: Payer: Self-pay | Attending: Obstetrics and Gynecology

## 2023-12-24 ENCOUNTER — Emergency Department
Admission: EM | Admit: 2023-12-24 | Discharge: 2023-12-24 | Disposition: A | Discharge status: Home / Self Care | Payer: Self-pay | Attending: Emergency Medicine | Admitting: Emergency Medicine

## 2023-12-24 ENCOUNTER — Other Ambulatory Visit: Payer: Self-pay

## 2023-12-24 DIAGNOSIS — H1031 Unspecified acute conjunctivitis, right eye: Secondary | ICD-10-CM | POA: Insufficient documentation

## 2023-12-24 DIAGNOSIS — I10 Essential (primary) hypertension: Secondary | ICD-10-CM | POA: Insufficient documentation

## 2023-12-24 DIAGNOSIS — H1011 Acute atopic conjunctivitis, right eye: Secondary | ICD-10-CM

## 2023-12-24 DIAGNOSIS — R030 Elevated blood-pressure reading, without diagnosis of hypertension: Secondary | ICD-10-CM

## 2023-12-24 NOTE — ED Triage Notes (Signed)
Pt sts that she has been having right eye drainage and itchiness.

## 2023-12-24 NOTE — Discharge Instructions (Signed)
Please take antihistamines such as Benadryl 25 mg 1-2 times daily.  Follow-up with primary care and eye doctor.  Return here for new or worse symptoms.

## 2023-12-24 NOTE — ED Provider Notes (Signed)
[place] EMERGENCY DEPARTMENT AT [facility] Provider Note   CSN: [id number] Arrival date & time: 12/24/23  1341     History  Chief Complaint  Patient presents with   Conjunctivitis    Valerie Andrews is a 58 y.o. female.  Patient here for eye problem.  Notes crusting right eye mostly in the morning for about 2 months.  No difficulty with vision.  No injury.  Does wear glasses never contacts.  No history of similar.   Conjunctivitis Pertinent negatives include no chest pain.       Home Medications Prior to Admission medications   Medication Sig Start Date End Date Taking? Authorizing Provider  doxycycline (VIBRA-TABS) 100 MG tablet Take 1 tablet (100 mg total) by mouth 2 (two) times daily for 10 days Patient not taking: Reported on 12/02/2022 11/19/22     ibuprofen (ADVIL) 600 MG tablet Take 1 tablet (600 mg total) by mouth every 6 (six) hours. 06/03/22   Conard Novak, MD  meloxicam (MOBIC) 7.5 MG tablet Take 7.5 mg by mouth 2 (two) times daily.    [provider]  Multiple Vitamin (MULTIVITAMIN WITH MINERALS) TABS tablet Take 1 tablet by mouth daily.    [provider]  ondansetron (ZOFRAN-ODT) 4 MG disintegrating tablet Take 1 tablet (4 mg total) by mouth every 6 (six) hours as needed for nausea. 06/03/22   Conard Novak, MD  predniSONE (DELTASONE) 20 MG tablet Take 1 tablet (20 mg total) by mouth 2 (two) times daily for 5 days Patient not taking: Reported on 12/02/2022 11/19/22     promethazine-dextromethorphan (PROMETHAZINE-DM) 6.25-15 MG/5ML syrup Take 5 mLs by mouth every 6 (six) hours as needed for up to 7 days Patient not taking: Reported on 12/02/2022 11/19/22     trolamine salicylate (ASPERCREME) 10 % cream Apply 1 application. topically as needed for muscle pain. Patient not taking: Reported on 12/02/2022    [provider]      Allergies    Methocarbamol, Mushroom extract complex (obsolete), and Tramadol    Review of  Systems   Review of Systems  Eyes:  Positive for discharge and itching. Negative for pain.  Cardiovascular:  Negative for chest pain.    Physical Exam Updated Vital Signs BP (!) 150/81 (BP Location: Left Wrist)   Pulse 96   Temp 98.2 F (36.8 C) (Oral)   Resp 18   Ht 5\' 7"  (1.702 m)   Wt (!) 158.8 kg   LMP  (LMP Unknown) Comment: last in Feb  SpO2 97%   BMI 54.82 kg/m  Physical Exam Vitals and nursing note reviewed.  Constitutional:      General: She is not in acute distress.    Appearance: She is well-developed.  HENT:     Head: Normocephalic and atraumatic.  Eyes:     Conjunctiva/sclera: Conjunctivae normal.     Comments: Right eye without any conjunctivitis no drainage.  Cardiovascular:     Rate and Rhythm: Normal rate and regular rhythm.     Heart sounds: No murmur heard. Pulmonary:     Effort: Pulmonary effort is normal. No respiratory distress.     Breath sounds: Normal breath sounds.  Abdominal:     Palpations: Abdomen is soft.     Tenderness: There is no abdominal tenderness.  Musculoskeletal:        General: No swelling.     Cervical back: Neck supple.  Skin:    General: Skin is warm and dry.  Capillary Refill: Capillary refill takes less than 2 seconds.  Neurological:     Mental Status: She is alert.  Psychiatric:        Mood and Affect: Mood normal.     ED Results / Procedures / Treatments   Labs (all labs ordered are listed, but only abnormal results are displayed) Labs Reviewed - No data to display  EKG None  Radiology No results found.  Procedures Procedures    Medications Ordered in ED Medications - No data to display  ED Course/ Medical Decision Making/ A&P                                 Medical Decision Making Patient here for eye pain/conjunctivitis.  She has been having some drainage of right eye mostly in the mornings for the past several months.  She does not wear any contacts.  I do not suspect bacterial etiology  given the duration and the lack of symptoms currently.  She does have mild hypertension here 150/81 she is asymptomatic.  No history of hypertension I will recommend monitor at home and follow-up with primary doctor referred to PCP and eye care           Final Clinical Impression(s) / ED Diagnoses Final diagnoses:  Allergic conjunctivitis of right eye  Elevated blood pressure reading    Rx / DC Orders ED Discharge Orders     None         Christen Bame, [place] 12/24/23 1514    Chesley Noon, MD 12/24/23 1536

## 2024-01-19 IMAGING — MR MR PELVIS WO/W CM
16 of 17 series · 44 of 48 positions shown · IV contrast (gadavist)
Comparison: Pelvic ultrasound, 12/06/2021, CT abdomen pelvis,
12/06/2021

CLINICAL DATA: Right adnexal mass, history of prior left
salpingectomy

EXAM:
MRI PELVIS WITHOUT AND WITH CONTRAST
TECHNIQUE: Multiplanar multisequence MR imaging of the pelvis was performed
both before and after administration of intravenous contrast.
CONTRAST:  10mL GADAVIST GADOBUTROL 1 MMOL/ML IV SOLN

[Series 2: T2 · coronal · 5.0mm · 1.56mm/px · 1 of 30 slices shown (1 of 5)]
[im 1/30]
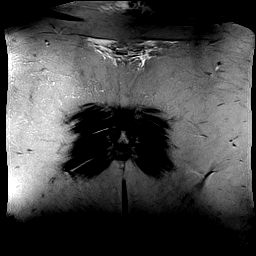

[Series 3: T2 · axial · 5.0mm · 0.47mm/px · 1 of 30 slices shown (2 of 5)]
[im 1/30]
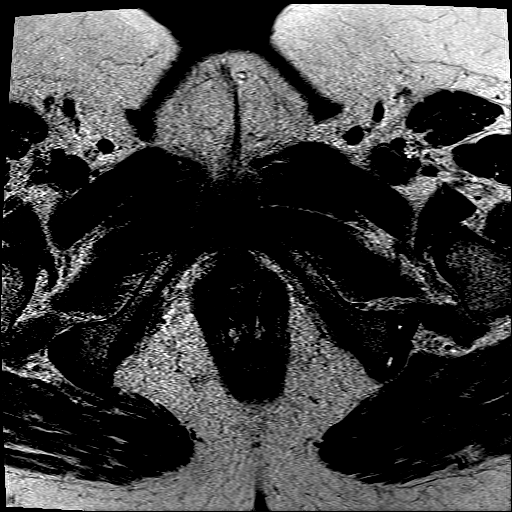

[Series 5: T2 · axial · 5.0mm · 0.47mm/px · z∈[-118,+56]mm · 2 of 30 slices shown (3 of 5)]
[im 1/30]
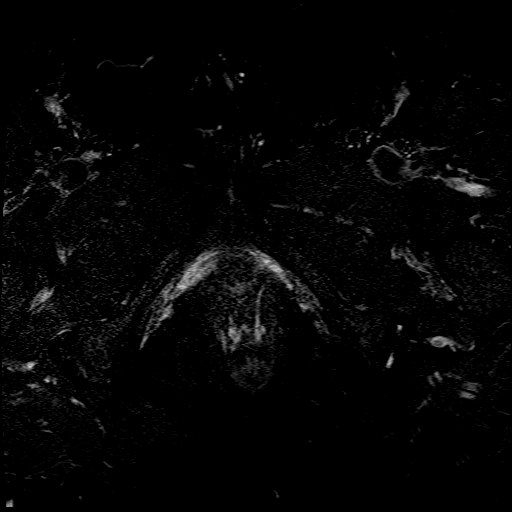
[im 30/30]
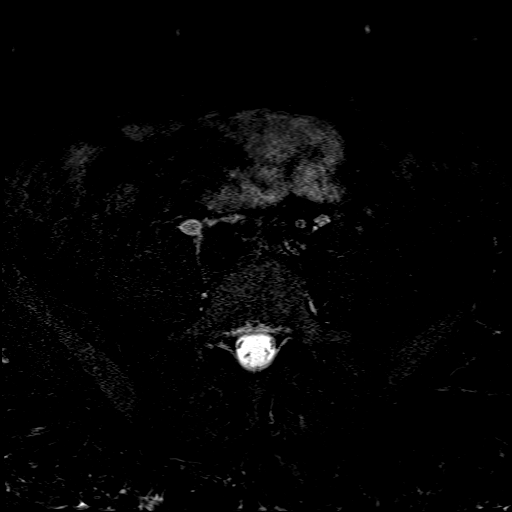

[Series 6: T2 · sagittal · 5.0mm · 0.75mm/px · 2 of 33 slices shown (4 of 5)]
[im 1/33]
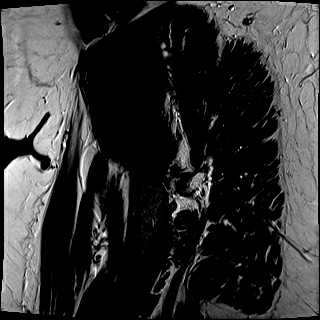
[im 33/33]
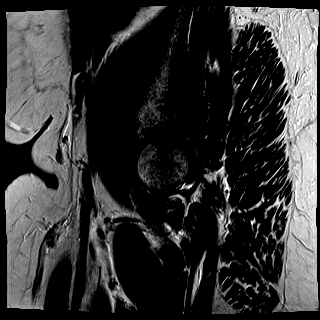

[Series 7: ax dwi_tracew · axial · 5.0mm · 1.42mm/px · z∈[-130,+68]mm · 6 of 102 slices shown]
[im 1/102]
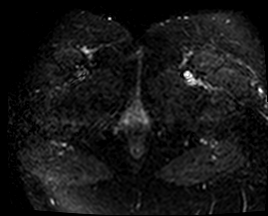
[im 21/102]
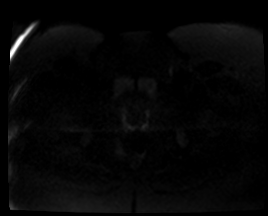
[im 41/102]
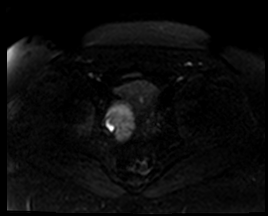
[im 61/102]
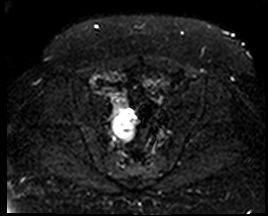
[im 81/102]
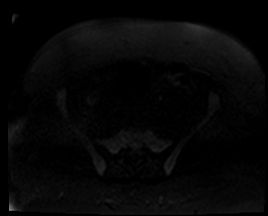
[im 102/102]
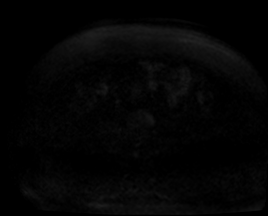

[Series 8: ax dwi_adc · axial · 5.0mm · 1.42mm/px · z∈[-130,+68]mm · 2 of 34 slices shown]
[im 1/34]
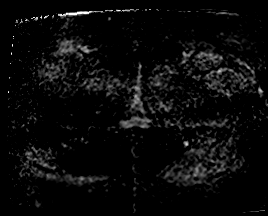
[im 34/34]
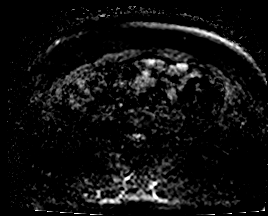

[Series 9: T2 · coronal · 5.0mm · 0.88mm/px · 2 of 33 slices shown (5 of 5)]
[im 1/33]
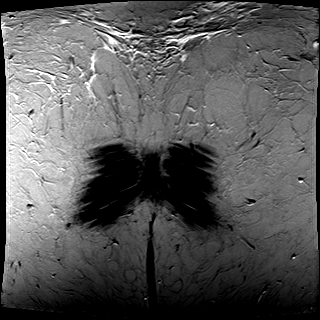
[im 33/33]
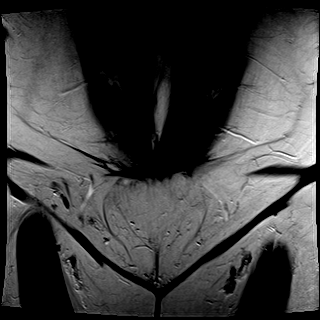

[Series 11: T1 dynamic · axial · 4.0mm · 0.75mm/px · z∈[-133,+71]mm · 3 of 52 slices shown (1 of 8)]
[im 1/52]
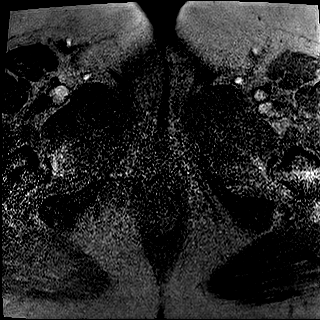
[im 26/52]
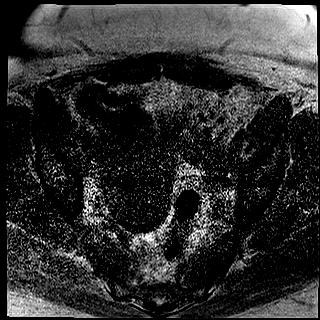
[im 52/52]
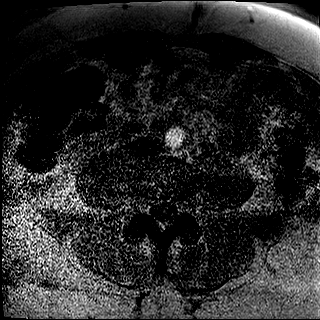

[Series 13: T1 dynamic · axial · 4.0mm · 0.75mm/px · z∈[-133,+71]mm · 3 of 52 slices shown (2 of 8)]
[im 1/52]
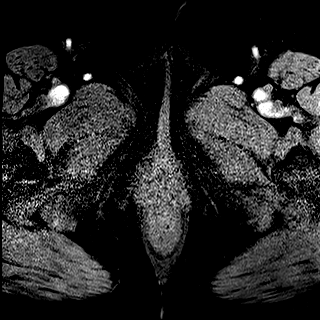
[im 26/52]
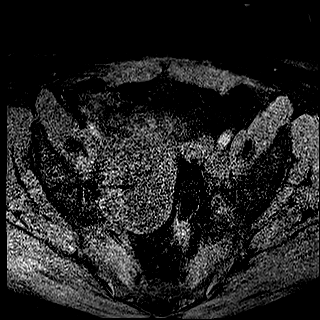
[im 52/52]
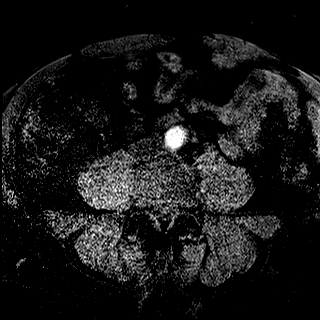

[Series 20: T1 dynamic · axial · 4.0mm · 0.75mm/px · z∈[-133,+71]mm · 3 of 52 slices shown (3 of 8)]
[im 1/52]
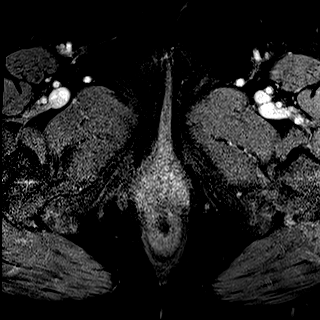
[im 26/52]
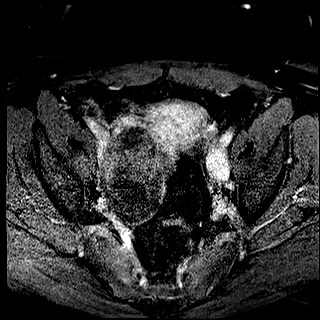
[im 52/52]
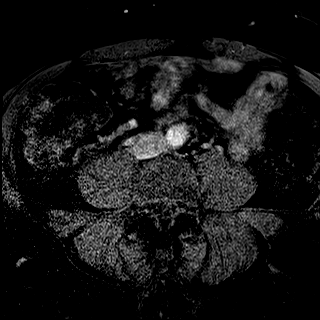

[Series 21: T1 dynamic · axial · 4.0mm · 0.75mm/px · z∈[-133,+71]mm · 3 of 52 slices shown (4 of 8)]
[im 1/52]
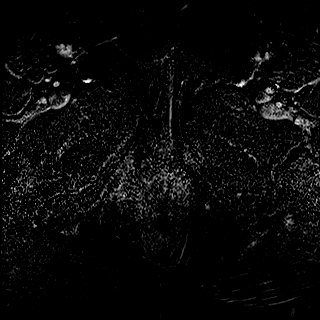
[im 26/52]
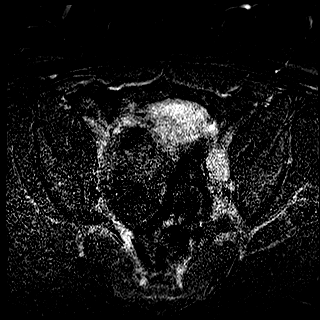
[im 52/52]
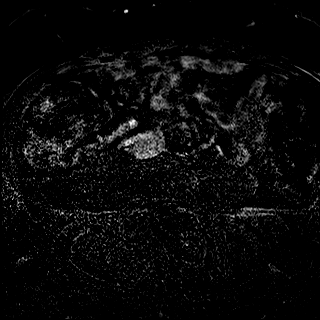

[Series 28: T1 dynamic · axial · 4.0mm · 0.75mm/px · z∈[-133,+71]mm · 3 of 52 slices shown (5 of 8)]
[im 1/52]
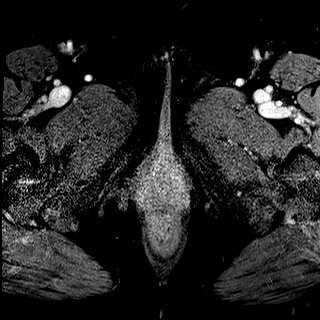
[im 26/52]
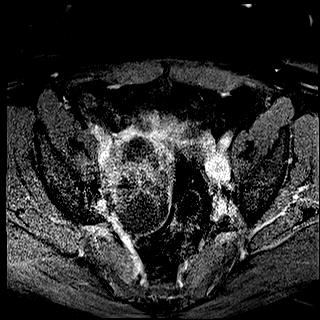
[im 52/52]
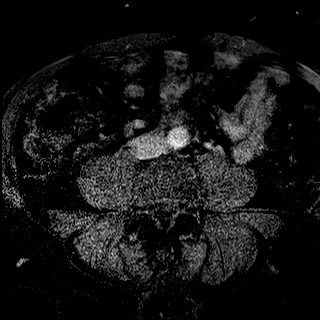

[Series 29: T1 dynamic · axial · 4.0mm · 0.75mm/px · z∈[-133,+71]mm · 3 of 52 slices shown (6 of 8)]
[im 1/52]
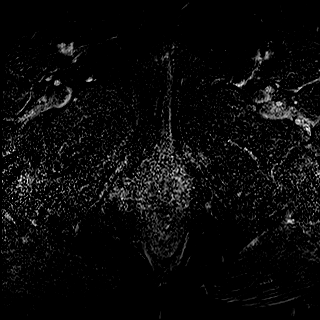
[im 26/52]
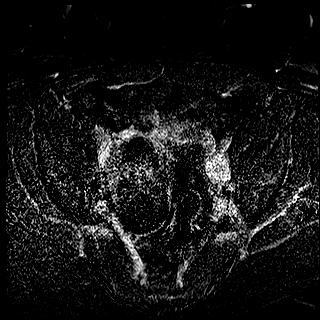
[im 52/52]
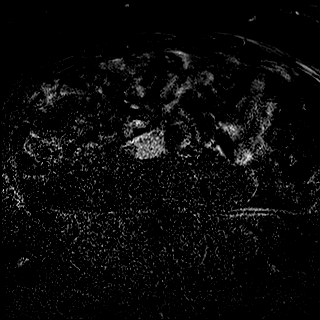

[Series 36: T1 dynamic · axial · 4.0mm · 0.75mm/px · z∈[-133,+71]mm · 3 of 52 slices shown (7 of 8)]
[im 1/52]
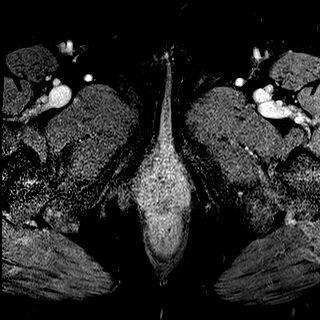
[im 26/52]
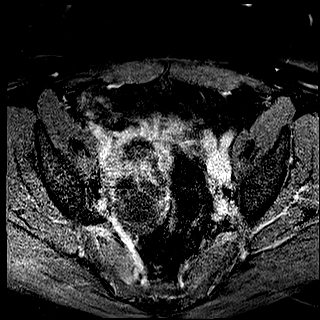
[im 52/52]
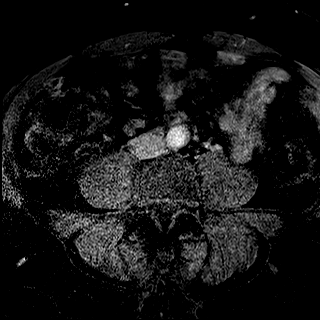

[Series 37: T1 dynamic · axial · 4.0mm · 0.75mm/px · z∈[-133,+71]mm · 3 of 52 slices shown (8 of 8)]
[im 1/52]
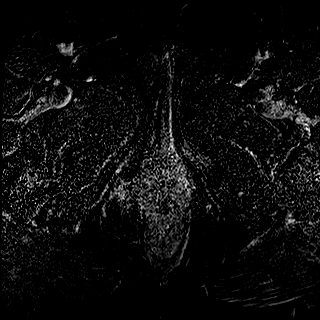
[im 26/52]
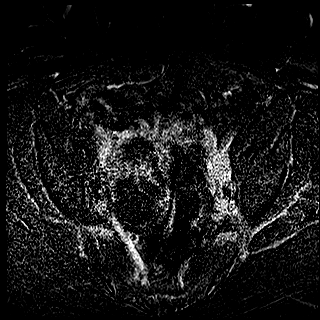
[im 52/52]
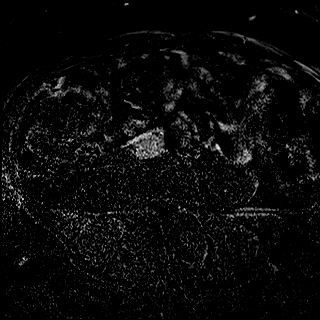

[Series 40: T1 fat-sat post-contrast · sagittal · 3.0mm · 0.88mm/px · 4 of 72 slices shown]
[im 1/72]
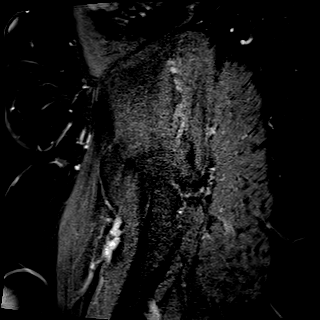
[im 24/72]
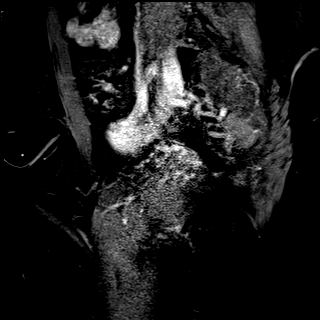
[im 48/72]
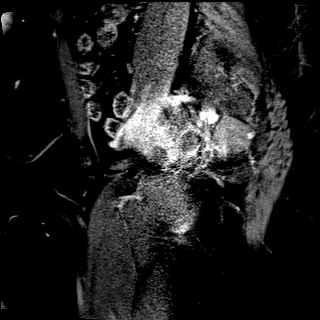
[im 72/72]
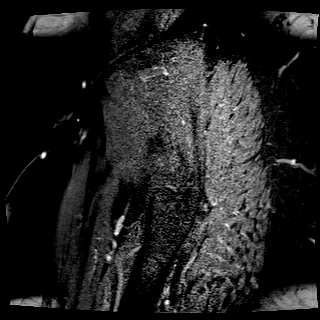

[44 of 48 positions shown; findings below may reference images not displayed]

FINDINGS: Urinary Tract:  No abnormality visualized.

Bowel:  Descending and sigmoid diverticulosis.

Vascular/Lymphatic: No pathologically enlarged lymph nodes. No
significant vascular abnormality seen.

Reproductive: Mixed solid cystic mass of the right ovary measuring
7.1 x 6.9 x 4.9 cm, with heterogeneous associated contrast
enhancement (series 3, image 15). The left ovary is normal in size
and appearance.

Other:  None.

Musculoskeletal: No suspicious bone lesions identified.
IMPRESSION: 1. Mixed solid cystic mass of the right ovary measuring 7.1 x 6.9 x
4.9 cm, highly concerning for primary ovarian malignancy.
2. Normal left ovary.
3. No evidence of pelvic lymphadenopathy or metastatic disease.
4. Descending and sigmoid diverticulosis.

These results will be called to the ordering clinician or
representative by the Radiologist Assistant, and communication
documented in the PACS or [REDACTED].

## 2024-02-11 ENCOUNTER — Other Ambulatory Visit: Payer: Self-pay

## 2024-10-13 ENCOUNTER — Other Ambulatory Visit: Payer: Self-pay

## 2024-10-16 ENCOUNTER — Other Ambulatory Visit: Payer: Self-pay
